# Patient Record
Sex: Female | Born: 1950
Health system: Southern US, Community
[De-identification: ages and names within clinical notes are randomized; demographics above are authoritative.]

## PROBLEM LIST (undated history)

## (undated) ENCOUNTER — Emergency Department (HOSPITAL_COMMUNITY): Admission: EM | Payer: Medicare Other

## (undated) DIAGNOSIS — I10 Essential (primary) hypertension: Secondary | ICD-10-CM

## (undated) DIAGNOSIS — N390 Urinary tract infection, site not specified: Secondary | ICD-10-CM

## (undated) DIAGNOSIS — Z923 Personal history of irradiation: Secondary | ICD-10-CM

## (undated) DIAGNOSIS — C50919 Malignant neoplasm of unspecified site of unspecified female breast: Secondary | ICD-10-CM

## (undated) DIAGNOSIS — C801 Malignant (primary) neoplasm, unspecified: Secondary | ICD-10-CM

## (undated) DIAGNOSIS — E785 Hyperlipidemia, unspecified: Secondary | ICD-10-CM

## (undated) DIAGNOSIS — K219 Gastro-esophageal reflux disease without esophagitis: Secondary | ICD-10-CM

## (undated) HISTORY — PX: TONSILLECTOMY: SUR1361

## (undated) HISTORY — PX: BREAST LUMPECTOMY: SHX2

## (undated) HISTORY — PX: HAND SURGERY: SHX662

## (undated) HISTORY — PX: BREAST BIOPSY: SHX20

## (undated) HISTORY — DX: Personal history of irradiation: Z92.3

## (undated) HISTORY — PX: DILATION AND CURETTAGE OF UTERUS: SHX78

---

## 1999-07-26 ENCOUNTER — Encounter: Payer: Self-pay | Admitting: Gynecology

## 1999-07-26 ENCOUNTER — Encounter: Admission: RE | Admit: 1999-07-26 | Discharge: 1999-07-26 | Payer: Self-pay | Admitting: Gynecology

## 1999-12-22 ENCOUNTER — Other Ambulatory Visit: Admission: RE | Admit: 1999-12-22 | Discharge: 1999-12-22 | Payer: Self-pay | Admitting: Gynecology

## 2000-07-31 ENCOUNTER — Encounter: Admission: RE | Admit: 2000-07-31 | Discharge: 2000-07-31 | Payer: Self-pay | Admitting: Gynecology

## 2000-07-31 ENCOUNTER — Encounter: Payer: Self-pay | Admitting: Gynecology

## 2001-08-01 ENCOUNTER — Encounter: Payer: Self-pay | Admitting: Gynecology

## 2001-08-01 ENCOUNTER — Encounter: Admission: RE | Admit: 2001-08-01 | Discharge: 2001-08-01 | Payer: Self-pay | Admitting: Gynecology

## 2001-11-29 ENCOUNTER — Other Ambulatory Visit: Admission: RE | Admit: 2001-11-29 | Discharge: 2001-11-29 | Payer: Self-pay | Admitting: Gynecology

## 2002-08-02 ENCOUNTER — Encounter: Payer: Self-pay | Admitting: Gynecology

## 2002-08-02 ENCOUNTER — Encounter: Admission: RE | Admit: 2002-08-02 | Discharge: 2002-08-02 | Payer: Self-pay | Admitting: Gynecology

## 2003-01-27 ENCOUNTER — Other Ambulatory Visit: Admission: RE | Admit: 2003-01-27 | Discharge: 2003-01-27 | Payer: Self-pay | Admitting: Gynecology

## 2003-08-07 ENCOUNTER — Encounter: Admission: RE | Admit: 2003-08-07 | Discharge: 2003-08-07 | Payer: Self-pay | Admitting: Gynecology

## 2004-03-08 ENCOUNTER — Other Ambulatory Visit: Admission: RE | Admit: 2004-03-08 | Discharge: 2004-03-08 | Payer: Self-pay | Admitting: Gynecology

## 2004-08-31 ENCOUNTER — Encounter: Admission: RE | Admit: 2004-08-31 | Discharge: 2004-08-31 | Payer: Self-pay | Admitting: Gynecology

## 2005-03-08 ENCOUNTER — Other Ambulatory Visit: Admission: RE | Admit: 2005-03-08 | Discharge: 2005-03-08 | Payer: Self-pay | Admitting: Gynecology

## 2005-09-20 ENCOUNTER — Encounter: Admission: RE | Admit: 2005-09-20 | Discharge: 2005-09-20 | Payer: Self-pay | Admitting: Gynecology

## 2005-09-20 IMAGING — MG MM MAMMO SCREENING
4 series · 4 of 4 positions shown · non-contrast
Comparison: none

SCREENING MAMMOGRAM:
There is a  dense fibroglandular pattern.  No masses or malignant type calcifications are 
identified.  Compared with prior studies.

[R CC]
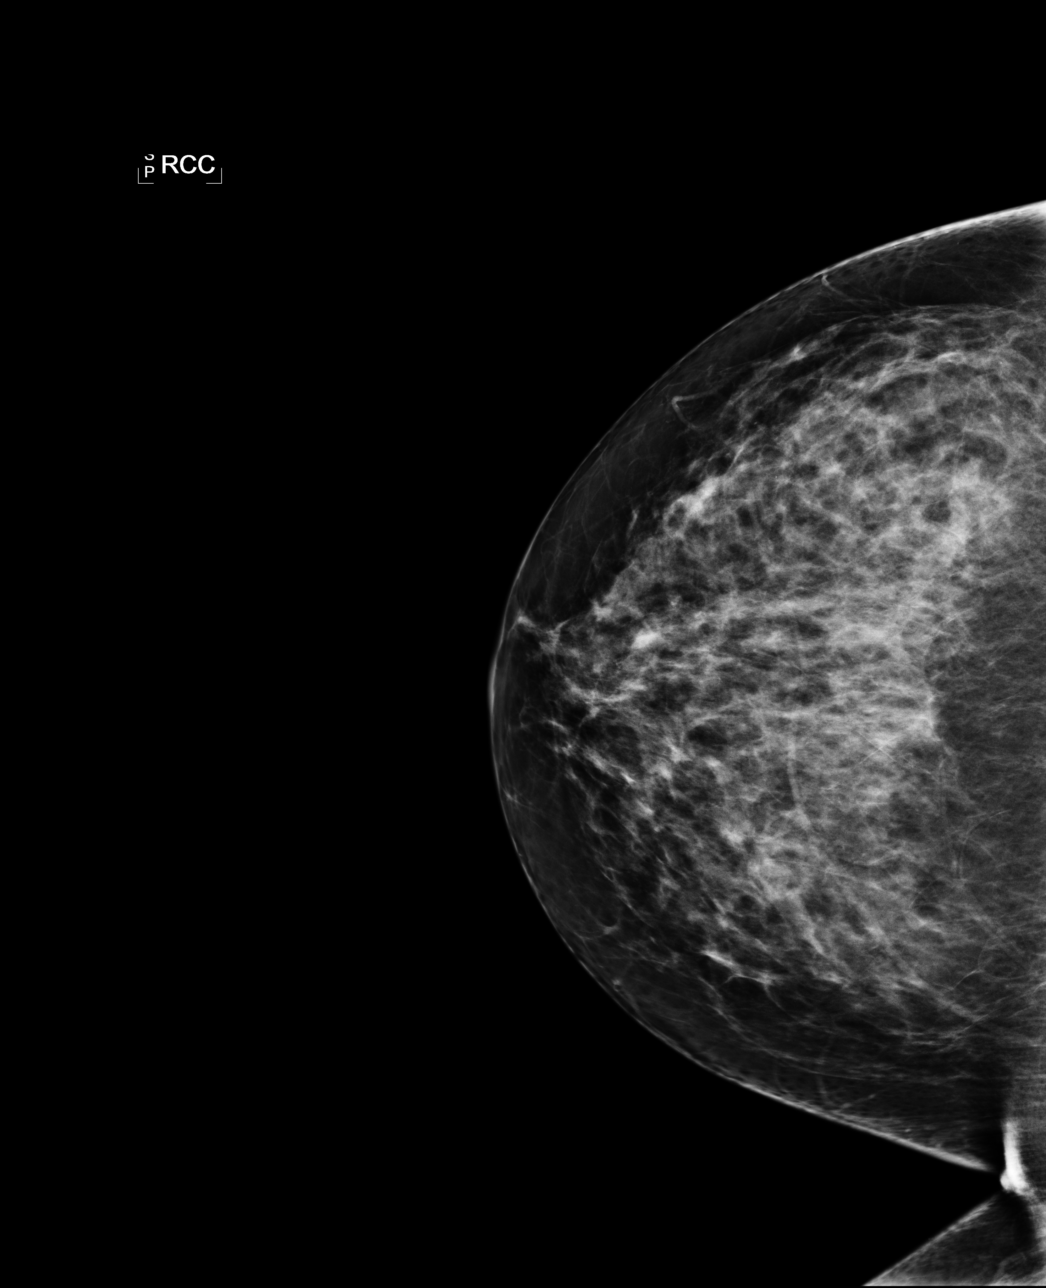

[L CC]
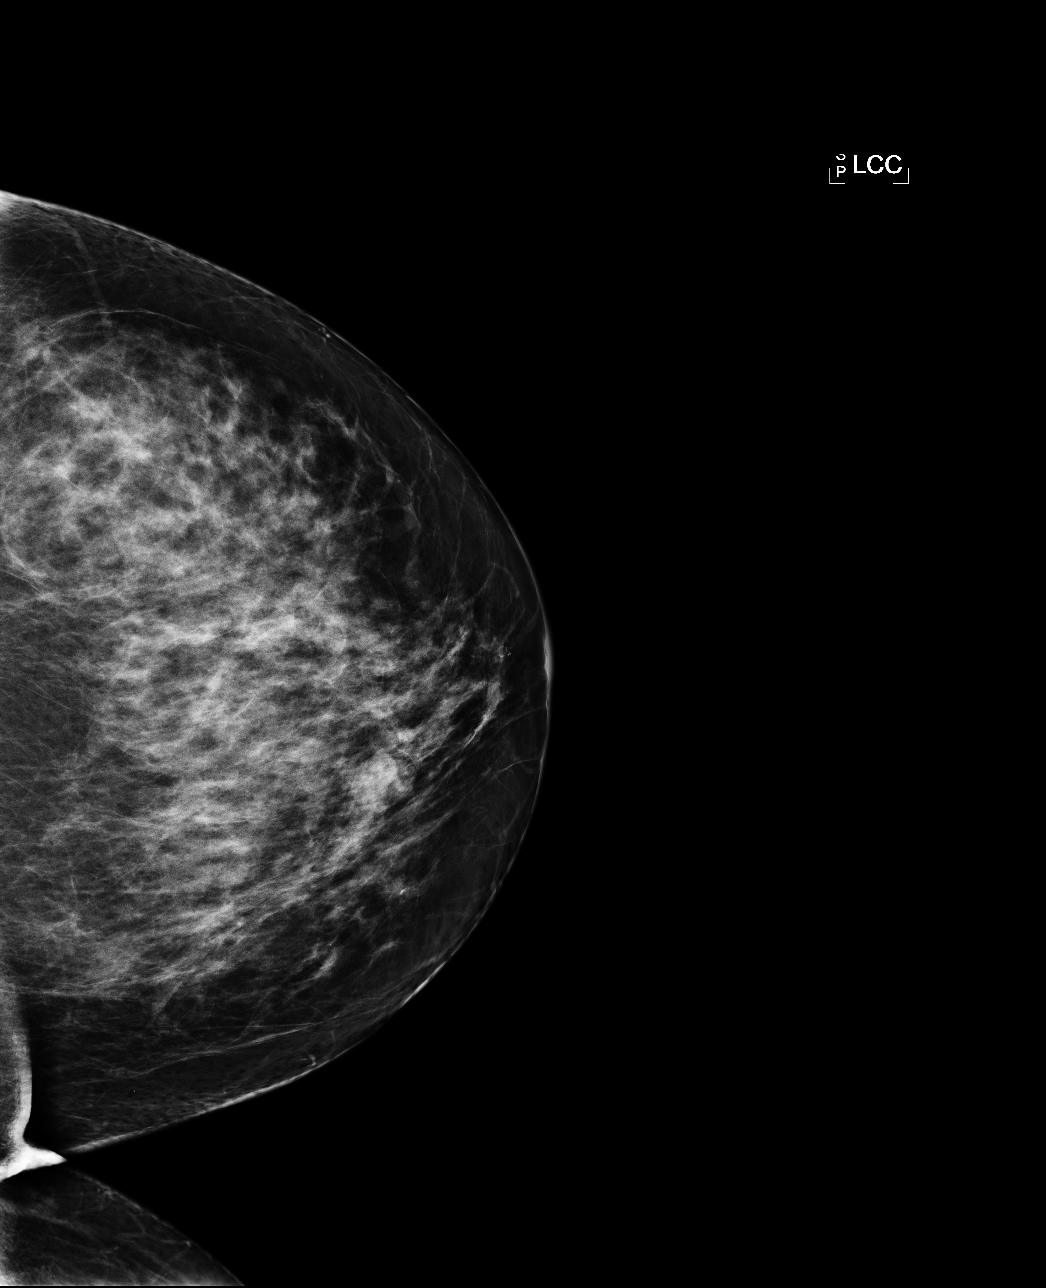

[L MLO]
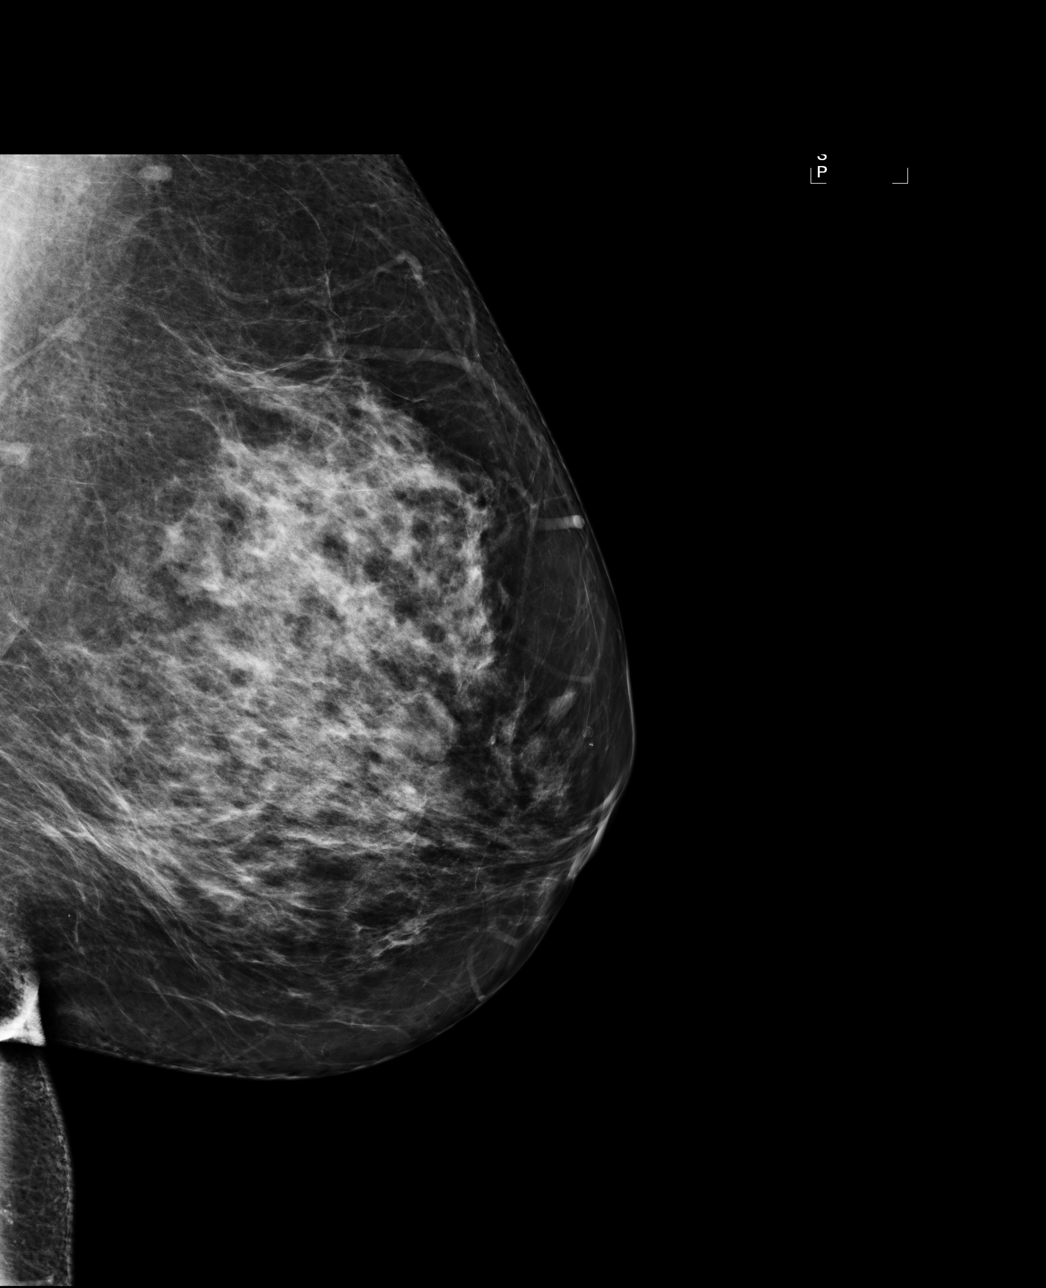

[R MLO]
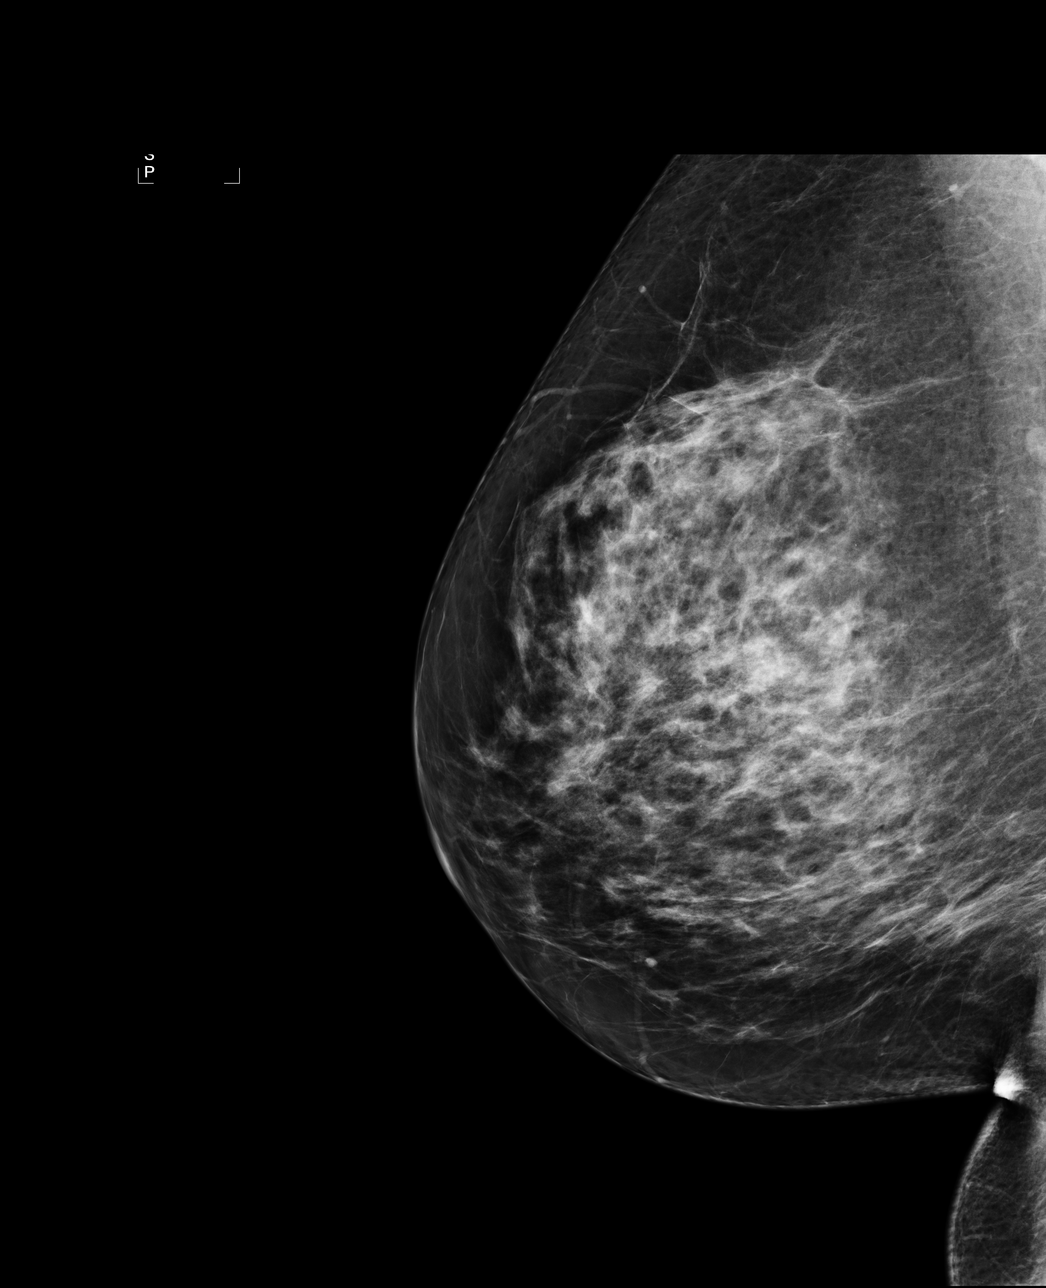

[4 of 4 positions shown; findings below may reference images not displayed]

IMPRESSION: No specific mammographic evidence of malignancy.  Next screening mammogram is recommended in one 
year.

ASSESSMENT: Negative - BI-RADS 1

Screening mammogram in 1 year.

## 2006-03-30 ENCOUNTER — Other Ambulatory Visit: Admission: RE | Admit: 2006-03-30 | Discharge: 2006-03-30 | Payer: Self-pay | Admitting: Gynecology

## 2006-10-19 ENCOUNTER — Encounter: Admission: RE | Admit: 2006-10-19 | Discharge: 2006-10-19 | Payer: Self-pay | Admitting: Gynecology

## 2007-06-26 ENCOUNTER — Other Ambulatory Visit: Admission: RE | Admit: 2007-06-26 | Discharge: 2007-06-26 | Payer: Self-pay | Admitting: Gynecology

## 2007-10-26 ENCOUNTER — Encounter: Admission: RE | Admit: 2007-10-26 | Discharge: 2007-10-26 | Payer: Self-pay | Admitting: Gynecology

## 2008-09-08 ENCOUNTER — Other Ambulatory Visit: Admission: RE | Admit: 2008-09-08 | Discharge: 2008-09-08 | Payer: Self-pay | Admitting: Gynecology

## 2008-10-31 ENCOUNTER — Encounter: Admission: RE | Admit: 2008-10-31 | Discharge: 2008-10-31 | Payer: Self-pay | Admitting: Gynecology

## 2008-11-28 ENCOUNTER — Encounter (INDEPENDENT_AMBULATORY_CARE_PROVIDER_SITE_OTHER): Payer: Self-pay | Admitting: Obstetrics and Gynecology

## 2008-11-28 ENCOUNTER — Ambulatory Visit (HOSPITAL_COMMUNITY): Admission: RE | Admit: 2008-11-28 | Discharge: 2008-11-28 | Payer: Self-pay | Admitting: Obstetrics and Gynecology

## 2008-11-28 HISTORY — PX: HYSTEROSCOPY WITH D & C: SHX1775

## 2008-12-18 ENCOUNTER — Emergency Department (HOSPITAL_COMMUNITY): Admission: EM | Admit: 2008-12-18 | Discharge: 2008-12-19 | Payer: Self-pay | Admitting: Emergency Medicine

## 2009-10-13 ENCOUNTER — Encounter: Admission: RE | Admit: 2009-10-13 | Discharge: 2009-10-13 | Payer: Self-pay | Admitting: Obstetrics and Gynecology

## 2009-11-02 ENCOUNTER — Encounter: Admission: RE | Admit: 2009-11-02 | Discharge: 2009-11-02 | Payer: Self-pay | Admitting: Obstetrics and Gynecology

## 2010-10-26 ENCOUNTER — Other Ambulatory Visit: Payer: Self-pay | Admitting: Obstetrics and Gynecology

## 2010-10-26 DIAGNOSIS — Z1239 Encounter for other screening for malignant neoplasm of breast: Secondary | ICD-10-CM

## 2010-11-08 ENCOUNTER — Ambulatory Visit
Admission: RE | Admit: 2010-11-08 | Discharge: 2010-11-08 | Disposition: A | Discharge status: Home / Self Care | Payer: 59 | Source: Ambulatory Visit | Attending: Obstetrics and Gynecology | Admitting: Obstetrics and Gynecology

## 2010-11-08 DIAGNOSIS — Z1239 Encounter for other screening for malignant neoplasm of breast: Secondary | ICD-10-CM

## 2010-12-01 LAB — URINE CULTURE

## 2010-12-01 LAB — BASIC METABOLIC PANEL
BUN: 9 mg/dL (ref 6–23)
CO2: 29 mEq/L (ref 19–32)
Calcium: 9.5 mg/dL (ref 8.4–10.5)
Creatinine, Ser: 0.67 mg/dL (ref 0.4–1.2)
Sodium: 140 mEq/L (ref 135–145)

## 2010-12-01 LAB — GLUCOSE, CAPILLARY: Glucose-Capillary: 154 mg/dL — ABNORMAL HIGH (ref 70–99)

## 2010-12-01 LAB — URINALYSIS, ROUTINE W REFLEX MICROSCOPIC
Bilirubin Urine: NEGATIVE
Glucose, UA: NEGATIVE mg/dL
Ketones, ur: NEGATIVE mg/dL
Nitrite: NEGATIVE
Protein, ur: NEGATIVE mg/dL
Specific Gravity, Urine: 1.004 — ABNORMAL LOW (ref 1.005–1.030)
Urobilinogen, UA: 0.2 mg/dL (ref 0.0–1.0)
pH: 6.5 (ref 5.0–8.0)

## 2010-12-01 LAB — CBC
HCT: 43.4 % (ref 36.0–46.0)
Hemoglobin: 14.4 g/dL (ref 12.0–15.0)
RBC: 4.85 MIL/uL (ref 3.87–5.11)

## 2010-12-01 LAB — URINE MICROSCOPIC-ADD ON

## 2011-01-04 NOTE — Op Note (Signed)
NAMEMarland Kitchen  Meghan Padilla, Meghan Padilla NO.:  [id number]   MEDICAL RECORD NO.:  [id number]          PATIENT TYPE:  AMB   LOCATION:  SDC                           FACILITY:  [facility]   PHYSICIAN:  Juluis Mire, M.D.   DATE OF BIRTH:  03-May-1951   DATE OF PROCEDURE:  11/28/2008  DATE OF DISCHARGE:                               OPERATIVE REPORT   PREOPERATIVE DIAGNOSIS:  Endometrial polyp.   POSTOPERATIVE DIAGNOSIS:  Endometrial polyp.   OPERATIVE PROCEDURES:  1. Paracervical block.  2. Cervical dilatation.  3. Hysteroscopy with removal of endometrial polyps.  4. Endometrial biopsies along with curettings.   SURGEON:  Juluis Mire, MD   ANESTHESIA:  Paracervical block along with MAC anesthesia.   ESTIMATED BLOOD LOSS:  Minimal.   PACKS AND DRAINS:  None.   INTRAOPERATIVE BLOOD PLACED:  None.   COMPLICATIONS:  None.   INDICATIONS:  Dictated in the history and physical.   PROCEDURE:  The patient was taken to OR, placed in supine position.  After satisfactory level of MAC anesthesia was obtained, the patient was  placed in dorsal lithotomy position using the Allen stirrups.  The  perineum and vagina were cleansed out with Betadine and draped as  sterile field.  A speculum was placed in the vaginal vault.  The cervix  was grasped with a single-tooth tenaculum.  A paracervical block was  instituted using 1% Xylocaine.  Uterus sounded to approximately 9 cm.  Cervix serially dilated to a size 33 Pratt dilator.  Operative  hysteroscope was introduced.  The intrauterine cavity was distended  using sorbitol.  Multiple polyps were noted.  We next used the Randall  stone forceps and we were able to remove the larger polyp intact.  We  then reinserted the hysteroscope.  Polyps remaining were resected and  sent to Pathology.  We did multiple endometrial biopsies along with  curettings.  Total deficit was minimal.  There was no signs of perforation.  At this  point in time, the  single-tooth tenaculum and speculum were then  removed.  The patient taken out of dorsal lithotomy position once alert,  transferred to recovery room in good condition.  Sponges, instrument,  and needle count was reported correct by the circulating nurse.      Juluis Mire, M.D.  Electronically Signed     JSM/MEDQ  D:  11/28/2008  T:  11/29/2008  Job:  [id number]

## 2011-01-04 NOTE — H&P (Signed)
NAME:  Meghan Padilla, Meghan Padilla NO.:  [id number]   MEDICAL RECORD NO.:  [id number]          PATIENT TYPE:  AMB   LOCATION:  SDC                           FACILITY:  [facility]   PHYSICIAN:  Juluis Mire, M.D.   DATE OF BIRTH:  [birth year]   DATE OF ADMISSION:  11/28/2008  DATE OF DISCHARGE:                              HISTORY & PHYSICAL   The patient is a 7-year gravida 2, para 2, postmenopausal patient,  initially evaluated by Dr. Teodora Medici for postmenopausal bleeding,  underwent saline infusion ultrasound and we find several large  endometrial polyps, now presents for hysteroscopic resection.   ALLERGIES:  No known drug allergies are listed.   MEDICATIONS:  1. Accuretic 40 mg daily.  2. Lipitor 40 mg daily.  3. Aciphex.  4. Metformin.   PAST MEDICAL HISTORY:  Significant for history of glucose intolerance as  well as hypercholesterolemia followed by Dr. Valentina Lucks.  She has no  surgical history listed.   OBSTETRICAL HISTORY:  Two vaginal deliveries.   FAMILY HISTORY:  Noncontributory.   SOCIAL HISTORY:  No tobacco or alcohol use.   REVIEW OF SYSTEMS:  Noncontributory.   PHYSICAL EXAMINATION:  GENERAL:  The patient is afebrile, stable vital  signs.  HEENT:  The patient is normocephalic.  Pupils are equal, round, and  react to light and accommodation.  Extraocular movements were intact.  Sclerae and conjunctivae are clear.  Oropharynx is clear.  BREASTS:  Not examined.  LUNGS:  Clear.  CARDIAC:  Regular rate.  No murmurs or gallops.  ABDOMEN:  Benign.  No mass, organomegaly, or tenderness.  PELVIC:  Normal external genitalia.  Vaginal mucosa is clear.  Cervix  unremarkable.  Uterus normal size, shape, and contour.  Adnexa, free of  masses or tenderness.  EXTREMITIES:  Trace edema.  NEUROLOGIC:  Grossly normal limits.   IMPRESSION:  Postmenopausal bleeding with endometrial polyps.   PLAN:  The patient undergo hysteroscopic evaluation and resection of  polyps.   The risks of surgery have been discussed included the risk of  infection.  The risk of hemorrhage could require transfusion with the  risk of AIDS or  hepatitis.  Excessive bleeding could require hysterectomy.  There is a  risk of injury to adjacent organs through perforation that could require  further exploratory surgery.  Risk of deep venous thrombosis and  pulmonary embolus.  The patient does understand indications and  potential risks.      Juluis Mire, M.D.  Electronically Signed     JSM/MEDQ  D:  11/28/2008  T:  11/28/2008  Job:  [id number]

## 2011-04-09 ENCOUNTER — Emergency Department (HOSPITAL_COMMUNITY)
Admission: EM | Admit: 2011-04-09 | Discharge: 2011-04-09 | Disposition: A | Discharge status: Home / Self Care | Payer: 59 | Attending: Emergency Medicine | Admitting: Emergency Medicine

## 2011-04-09 DIAGNOSIS — R300 Dysuria: Secondary | ICD-10-CM | POA: Insufficient documentation

## 2011-04-09 DIAGNOSIS — E119 Type 2 diabetes mellitus without complications: Secondary | ICD-10-CM | POA: Insufficient documentation

## 2011-04-09 DIAGNOSIS — N390 Urinary tract infection, site not specified: Secondary | ICD-10-CM | POA: Insufficient documentation

## 2011-04-09 DIAGNOSIS — I10 Essential (primary) hypertension: Secondary | ICD-10-CM | POA: Insufficient documentation

## 2011-04-09 LAB — URINALYSIS, ROUTINE W REFLEX MICROSCOPIC
Bilirubin Urine: NEGATIVE
Glucose, UA: NEGATIVE mg/dL
Hgb urine dipstick: NEGATIVE
Protein, ur: NEGATIVE mg/dL
Specific Gravity, Urine: 1.013 (ref 1.005–1.030)
Urobilinogen, UA: 0.2 mg/dL (ref 0.0–1.0)
pH: 7 (ref 5.0–8.0)

## 2011-04-09 LAB — URINE MICROSCOPIC-ADD ON

## 2011-04-09 LAB — GLUCOSE, CAPILLARY

## 2011-04-12 LAB — URINE CULTURE: Colony Count: >=100000

## 2012-01-01 ENCOUNTER — Emergency Department (HOSPITAL_COMMUNITY)
Admission: EM | Admit: 2012-01-01 | Discharge: 2012-01-01 | Disposition: A | Discharge status: Home / Self Care | Payer: 59 | Source: Home / Self Care | Attending: Family Medicine | Admitting: Family Medicine

## 2012-01-01 ENCOUNTER — Encounter (HOSPITAL_COMMUNITY): Payer: Self-pay | Admitting: *Deleted

## 2012-01-01 DIAGNOSIS — N390 Urinary tract infection, site not specified: Secondary | ICD-10-CM

## 2012-01-01 HISTORY — DX: Essential (primary) hypertension: I10

## 2012-01-01 HISTORY — DX: Hyperlipidemia, unspecified: E78.5

## 2012-01-01 HISTORY — DX: Urinary tract infection, site not specified: N39.0

## 2012-01-01 LAB — POCT URINALYSIS DIP (DEVICE)
Bilirubin Urine: NEGATIVE
Glucose, UA: NEGATIVE mg/dL
Ketones, ur: NEGATIVE mg/dL
Protein, ur: NEGATIVE mg/dL

## 2012-01-01 MED ORDER — CEPHALEXIN 500 MG PO CAPS: 500.0000 mg | ORAL_CAPSULE | Freq: Four times a day (QID) | ORAL | Status: AC

## 2012-01-01 MED ORDER — FLUCONAZOLE 150 MG PO TABS: 150.0000 mg | ORAL_TABLET | Freq: Once | ORAL | Status: AC

## 2012-01-01 NOTE — ED Notes (Signed)
C/O familiar bladder pressure and urinary urgency she has had in past with UTIs.  Called urologist yesterday and was called in Rx for Uribel, but states not helping.  Denies flank pain or back pain; denies fevers.

## 2012-01-01 NOTE — ED Provider Notes (Signed)
History     CSN: [id number]  Arrival date & time 01/01/12  1526   None     Chief Complaint  Patient presents with  . Urinary Tract Infection    (Consider location/radiation/quality/duration/timing/severity/associated sxs/prior treatment) HPI Comments: Pt had uterine surgery several years ago, gets frequent UTIs since then.  Sees urologist for same. Developed usual sx yesterday morning.  Last UTI in 08/2011.    Patient is a 61 y.o. female presenting with dysuria. The history is provided by the patient.  Dysuria  This is a new problem. The current episode started yesterday. The problem occurs every urination. The problem has been gradually worsening. The quality of the pain is described as burning and aching. The pain is at a severity of 4/10. The pain is mild. There has been no fever. Associated symptoms include frequency and urgency. Pertinent negatives include no nausea, no vomiting, no discharge, no hematuria and no flank pain. Her past medical history is significant for recurrent UTIs.    Past Medical History  Diagnosis Date  . Hyperlipidemia   . Hypertension   . Diabetes mellitus   . UTI (urinary tract infection)     Past Surgical History  Procedure Date  . Tonsillectomy   . Hand surgery     History reviewed. No pertinent family history.  History  Substance Use Topics  . Smoking status: Not on file  . Smokeless tobacco: Not on file  . Alcohol Use: No    OB History    Grav Para Term Preterm Abortions TAB SAB Ect Mult Living                  Review of Systems  Constitutional: Negative for fever.  Gastrointestinal: Positive for abdominal pain. Negative for nausea and vomiting.  Genitourinary: Positive for dysuria, urgency and frequency. Negative for hematuria and flank pain.    Allergies  Sulfa antibiotics  Home Medications   Current Outpatient Rx  Name Route Sig Dispense Refill  . AMITRIPTYLINE HCL 25 MG PO TABS Oral Take 25 mg by mouth at bedtime.      . ATORVASTATIN CALCIUM 40 MG PO TABS Oral Take 40 mg by mouth daily.    Marland Kitchen METFORMIN HCL 500 MG PO TABS Oral Take 1,500 mg by mouth daily.    Marland Kitchen URIBEL PO Oral Take by mouth.    . OMEPRAZOLE 20 MG PO CPDR Oral Take 20 mg by mouth daily.    . QUINAPRIL-HYDROCHLOROTHIAZIDE PO Oral Take by mouth.    . CEPHALEXIN 500 MG PO CAPS Oral Take 1 capsule (500 mg total) by mouth 4 (four) times daily. 20 capsule 0  . FLUCONAZOLE 150 MG PO TABS Oral Take 1 tablet (150 mg total) by mouth once. May repeat dose in 1 week if symptoms persist. 2 tablet 0    BP 129/79  Pulse 104  Temp(Src) 98 F (36.7 C) (Oral)  Resp 16  SpO2 98%  Physical Exam  Constitutional: She appears well-developed and well-nourished. No distress.  Cardiovascular: Normal rate and normal heart sounds.   Pulmonary/Chest: Effort normal and breath sounds normal.  Abdominal: Soft. Bowel sounds are normal. There is no tenderness. There is no rebound and no guarding.    ED Course  Procedures (including critical care time)  Labs Reviewed  POCT URINALYSIS DIP (DEVICE) - Abnormal; Notable for the following:    Hgb urine dipstick MODERATE (*)    Leukocytes, UA LARGE (*) Biochemical Testing Only. Please order routine urinalysis from main lab  if confirmatory testing is needed.   All other components within normal limits   No results found.   1. UTI (lower urinary tract infection)       MDM          Cathlyn Parsons, NP 01/01/12 1615

## 2012-01-01 NOTE — Discharge Instructions (Signed)
Increase your water intake.  Try to drink 2 liters of water (eight 8-ounce cups) per day.  Finish all of the antibiotics.  If you feel you are not getting better, follow up with your doctor or urologist.   Urinary Tract Infection Infections of the urinary tract can start in several places. A bladder infection (cystitis), a kidney infection (pyelonephritis), and a prostate infection (prostatitis) are different types of urinary tract infections (UTIs). They usually get better if treated with medicines (antibiotics) that kill germs. Take all the medicine until it is gone. You or your child may feel better in a few days, but TAKE ALL MEDICINE or the infection may not respond and may become more difficult to treat. HOME CARE INSTRUCTIONS   Drink enough water and fluids to keep the urine clear or pale yellow. Cranberry juice is especially recommended, in addition to large amounts of water.   Avoid caffeine, tea, and carbonated beverages. They tend to irritate the bladder.   Alcohol may irritate the prostate.   Only take over-the-counter or prescription medicines for pain, discomfort, or fever as directed by your caregiver.  To prevent further infections:  Empty the bladder often. Avoid holding urine for long periods of time.   After a bowel movement, women should cleanse from front to back. Use each tissue only once.   Empty the bladder before and after sexual intercourse.  FINDING OUT THE RESULTS OF YOUR TEST Not all test results are available during your visit. If your or your child's test results are not back during the visit, make an appointment with your caregiver to find out the results. Do not assume everything is normal if you have not heard from your caregiver or the medical facility. It is important for you to follow up on all test results. SEEK MEDICAL CARE IF:   There is back pain.   Your baby is older than 3 months with a rectal temperature of 100.5 F (38.1 C) or higher for more  than 1 day.   Your or your child's problems (symptoms) are no better in 3 days. Return sooner if you or your child is getting worse.  SEEK IMMEDIATE MEDICAL CARE IF:   There is severe back pain or lower abdominal pain.   You or your child develops chills.   You have a fever.   Your baby is older than 3 months with a rectal temperature of 102 F (38.9 C) or higher.   Your baby is 59 months old or younger with a rectal temperature of 100.4 F (38 C) or higher.   There is nausea or vomiting.   There is continued burning or discomfort with urination.  MAKE SURE YOU:   Understand these instructions.   Will watch your condition.   Will get help right away if you are not doing well or get worse.  Document Released: 05/18/2005 Document Revised: 07/28/2011 Document Reviewed: 12/21/2006 [facility] Patient Information 2012 [place], [place].

## 2012-01-28 ENCOUNTER — Encounter (HOSPITAL_COMMUNITY): Payer: Self-pay | Admitting: *Deleted

## 2012-01-28 ENCOUNTER — Emergency Department (HOSPITAL_COMMUNITY)
Admission: EM | Admit: 2012-01-28 | Discharge: 2012-01-28 | Disposition: A | Discharge status: Home / Self Care | Payer: 59 | Attending: Emergency Medicine | Admitting: Emergency Medicine

## 2012-01-28 DIAGNOSIS — Z882 Allergy status to sulfonamides status: Secondary | ICD-10-CM | POA: Insufficient documentation

## 2012-01-28 DIAGNOSIS — E119 Type 2 diabetes mellitus without complications: Secondary | ICD-10-CM | POA: Insufficient documentation

## 2012-01-28 DIAGNOSIS — R3915 Urgency of urination: Secondary | ICD-10-CM

## 2012-01-28 DIAGNOSIS — I10 Essential (primary) hypertension: Secondary | ICD-10-CM | POA: Insufficient documentation

## 2012-01-28 DIAGNOSIS — R300 Dysuria: Secondary | ICD-10-CM | POA: Insufficient documentation

## 2012-01-28 DIAGNOSIS — E785 Hyperlipidemia, unspecified: Secondary | ICD-10-CM | POA: Insufficient documentation

## 2012-01-28 DIAGNOSIS — Z87440 Personal history of urinary (tract) infections: Secondary | ICD-10-CM | POA: Insufficient documentation

## 2012-01-28 LAB — URINE MICROSCOPIC-ADD ON

## 2012-01-28 LAB — URINALYSIS, ROUTINE W REFLEX MICROSCOPIC
Bilirubin Urine: NEGATIVE
Glucose, UA: NEGATIVE mg/dL
Hgb urine dipstick: NEGATIVE
Specific Gravity, Urine: 1.006 (ref 1.005–1.030)
Urobilinogen, UA: 0.2 mg/dL (ref 0.0–1.0)
pH: 7 (ref 5.0–8.0)

## 2012-01-28 LAB — POCT PREGNANCY, URINE: Preg Test, Ur: NEGATIVE

## 2012-01-28 NOTE — ED Notes (Signed)
Pt states she has had multiple uti and feels like she is starting to get another infection.

## 2012-01-28 NOTE — ED Notes (Signed)
Pt has recurrent UTI one in Jan, and one in May. Pt states that she started having the same feeling today that something wasn't right when she urinates. Pt able to provide sample in triage. Pt states frequency, burning, pressure.

## 2012-01-28 NOTE — ED Provider Notes (Signed)
History     CSN: [id number]  Arrival date & time 01/28/12  1931   First MD Initiated Contact with Patient 01/28/12 2150      Chief Complaint  Patient presents with  . Urinary Tract Infection    HPI  History provided by the patient. Patient is a 61 year old female with history of hypertension, hyperlipidemia and diabetes who presents with concerns for urinary tract infection. Patient reports having some pressure over her bladder with urinary frequency and urgency and again today. Patient states symptoms feel similar to the start of her previous urinary tract infections. Patient states he has had many recurrent UTIs since the beginning of the year. She was last treated for a urinary tract infection many weeks ago with Cipro. She has not had any complaints since that time until today. Symptoms are described as mild. She denies any other aggravating or alleviating factors. Symptoms have not been associated with any fever, chills, sweats, nausea, vomiting, flank pain. Patient denies any vaginal bleeding or vaginal discharge.    Past Medical History  Diagnosis Date  . Hyperlipidemia   . Hypertension   . Diabetes mellitus   . UTI (urinary tract infection)     Past Surgical History  Procedure Date  . Tonsillectomy   . Hand surgery     History reviewed. No pertinent family history.  History  Substance Use Topics  . Smoking status: Not on file  . Smokeless tobacco: Not on file  . Alcohol Use: No    OB History    Grav Para Term Preterm Abortions TAB SAB Ect Mult Living                  Review of Systems  Constitutional: Negative for fever and chills.  Gastrointestinal: Negative for nausea, vomiting, abdominal pain, diarrhea and constipation.  Genitourinary: Positive for dysuria, urgency and frequency. Negative for hematuria, flank pain, vaginal bleeding and vaginal discharge.    Allergies  Sulfa antibiotics  Home Medications   Current Outpatient Rx  Name Route Sig  Dispense Refill  . AMITRIPTYLINE HCL 25 MG PO TABS Oral Take 12.5 mg by mouth at bedtime.     . ATORVASTATIN CALCIUM 40 MG PO TABS Oral Take 40 mg by mouth daily.    Marland Kitchen METFORMIN HCL 500 MG PO TABS Oral Take 1,500 mg by mouth at bedtime.     . OMEPRAZOLE 20 MG PO CPDR Oral Take 20 mg by mouth daily.    . QUINAPRIL-HYDROCHLOROTHIAZIDE 20-25 MG PO TABS Oral Take 1 tablet by mouth daily.    Marland Kitchen URIBEL PO Oral Take 1 tablet by mouth every 4 (four) hours as needed. As needed for bladder spasm.      BP 156/75  Pulse 102  Temp(Src) 97.5 F (36.4 C) (Oral)  Resp 16  SpO2 98%  Physical Exam  Nursing note and vitals reviewed. Constitutional: She is oriented to person, place, and time. She appears well-developed and well-nourished. No distress.  HENT:  Head: Normocephalic.  Cardiovascular: Normal rate and regular rhythm.   Pulmonary/Chest: Effort normal and breath sounds normal. No respiratory distress. She has no wheezes.  Abdominal: Soft. There is no tenderness. There is no CVA tenderness.  Neurological: She is alert and oriented to person, place, and time.  Skin: Skin is warm and dry.  Psychiatric: She has a normal mood and affect. Her behavior is normal.    ED Course  Procedures   Results for orders placed during the hospital encounter of 01/28/12  URINALYSIS, ROUTINE W REFLEX MICROSCOPIC      Component Value Range   Color, Urine YELLOW  YELLOW    APPearance CLEAR  CLEAR    Specific Gravity, Urine 1.006  1.005 - 1.030    pH 7.0  5.0 - 8.0    Glucose, UA NEGATIVE  NEGATIVE (mg/dL)   Hgb urine dipstick NEGATIVE  NEGATIVE    Bilirubin Urine NEGATIVE  NEGATIVE    Ketones, ur NEGATIVE  NEGATIVE (mg/dL)   Protein, ur NEGATIVE  NEGATIVE (mg/dL)   Urobilinogen, UA 0.2  0.0 - 1.0 (mg/dL)   Nitrite NEGATIVE  NEGATIVE    Leukocytes, UA TRACE (*) NEGATIVE   POCT PREGNANCY, URINE      Component Value Range   Preg Test, Ur NEGATIVE  NEGATIVE   URINE MICROSCOPIC-ADD ON      Component Value  Range   Squamous Epithelial / LPF RARE  RARE    WBC, UA 0-2  <3 (WBC/hpf)       1. Urgency of urination   2. Dysuria       MDM  Patient seen and evaluated. Patient no acute distress.  Discussed the findings with UA with patient. At this time no significant findings to stress UTI. I discussed treatment options with patient. Patient feels that she will does return home and wait and see how things progress. Patient does have medications from her urologist help with frequency and urgency symptoms she will try using.       Angus Seller, [place] 01/29/12 [phone]

## 2012-01-28 NOTE — ED Notes (Signed)
Discharged via teach back

## 2012-01-28 NOTE — Discharge Instructions (Signed)
You were seen and evaluated for your symptoms of urinary urgency and bladder fullness. At this time your urine sample does not show clear signs for an infection. Please continue followup with your primary care providers or urology specialist for evaluation and treatment. Drink plenty of fluids to stay hydrated and help with symptoms.    Dysuria Dysuria is the medical term for pain with urination. There are many causes for dysuria, but urinary tract infection is the most common. If a urinalysis was performed it can show that there is a urinary tract infection. A urine culture confirms that you or your child is sick. You will need to follow up with a healthcare provider because:  If a urine culture was done you will need to know the culture results and treatment recommendations.   If the urine culture was positive, you or your child will need to be put on antibiotics or know if the antibiotics prescribed are the right antibiotics for your urinary tract infection.   If the urine culture is negative (no urinary tract infection), then other causes may need to be explored or antibiotics need to be stopped.  Today laboratory work may have been done and there does not seem to be an infection. If cultures were done they will take at least 24 to 48 hours to be completed. Today x-rays may have been taken and they read as normal. No cause can be found for the problems. The x-rays may be re-read by a radiologist and you will be contacted if additional findings are made. You or your child may have been put on medications to help with this problem until you can see your primary caregiver. If the problems get better, see your primary caregiver if the problems return. If you were given antibiotics (medications which kill germs), take all of the mediations as directed for the full course of treatment.  If laboratory work was done, you need to find the results. Leave a telephone number where you can be reached. If this  is not possible, make sure you find out how you are to get test results. HOME CARE INSTRUCTIONS   Drink lots of fluids. For adults, drink eight, 8 ounce glasses of clear juice or water a day. For children, replace fluids as suggested by your caregiver.   Empty the bladder often. Avoid holding urine for long periods of time.   After a bowel movement, women should cleanse front to back, using each tissue only once.   Empty your bladder before and after sexual intercourse.   Take all the medicine given to you until it is gone. You may feel better in a few days, but TAKE ALL MEDICINE.   Avoid caffeine, tea, alcohol and carbonated beverages, because they tend to irritate the bladder.   In men, alcohol may irritate the prostate.   Only take over-the-counter or prescription medicines for pain, discomfort, or fever as directed by your caregiver.   If your caregiver has given you a follow-up appointment, it is very important to keep that appointment. Not keeping the appointment could result in a chronic or permanent injury, pain, and disability. If there is any problem keeping the appointment, you must call back to this facility for assistance.  SEEK IMMEDIATE MEDICAL CARE IF:   Back pain develops.   A fever develops.   There is nausea (feeling sick to your stomach) or vomiting (throwing up).   Problems are no better with medications or are getting worse.  MAKE SURE YOU:  Understand these instructions.   Will watch your condition.   Will get help right away if you are not doing well or get worse.  Document Released: 05/06/2004 Document Revised: 07/28/2011 Document Reviewed: 03/13/2008 [facility] Patient Information 2012 [place], [place].   RESOURCE GUIDE  Chronic Pain Problems: Contact [facility]  [phone] Patients need to be referred by their primary care doctor.  Insufficient Money for Medicine: Contact United Way:  call "211" or [facility]  941-644-1783.  No Primary Care Doctor: - Call [facility]  [phone] - can help you locate a primary care doctor that  accepts your insurance, provides certain services, etc. - Physician Referral Service- 867-018-2022  Agencies that provide inexpensive medical care: - [facility]  [phone] - [facility]  [phone] - [facility]  [phone] - Women's Clinic  [phone] - Planned Parenthood  [phone] [facility]  [phone]  Medicaid-accepting [facility] Providers: - [facility]- [address]  [phone], Mon-Fri 9am-7pm, Sat 9am-1pm - [facility]- [address] [place], [address] [place]  [phone] - [facility]- [address] [place]  [phone] [facility]- [address]  [phone] - Renaye Rakers- [address] [place], [address], [phone]  Only accepts [place] patients after they have their name  applied to their card  Self Pay (no insurance) in [place]: - Sickle Cell Patients: Dr Willey Blade, [facility]  [address] [place], [phone] - [facility]- [address] [place] [address] [place]  [phone]       [facility] [place]- [address]       -     [facility]- see information above (Speak to [employer] if you do not have insurance)       -  [facility]- [address] [place], [phone]       -  [facility]- [address],  [phone]       -  [facility]- [address] [phone]       -  Dr Julio Sicks-  [address], [place], [phone]       -  [facility]- [address] [phone]       -  [facility]- [address] [phone], also [address] [phone]       -    [facility]- [address] [place], [phone], 1st & 3rd Saturday   every month, 10am-1pm  1)  Find a Doctor and Pay Out of Pocket Although you won't have to find out who is covered by your insurance plan, it is a good idea to ask around and get recommendations. You will then need to call the office and see if the doctor you have chosen will accept you as a new patient and what types of options they offer for patients who are self-pay. Some doctors offer discounts or will set up payment plans for their patients who do not have insurance, but you will need to ask so you aren't surprised when you get to your appointment.  2) Contact Your Local Health Department Not all health departments have doctors that can see patients for sick visits, but many do, so it is worth  a call to see if yours does. If you don't know where your local health department is, you can check in your phone book. The CDC also has a tool to help you locate your state's health department, and many state websites also have listings of all of their local health departments.  3) Find a Walk-in Clinic If your illness is not likely to be very severe or complicated, you may want to try a walk in clinic. These are popping up all over the country in pharmacies, drugstores, and shopping centers. They're usually staffed by nurse practitioners or physician assistants that have been trained to treat common illnesses and complaints. They're usually fairly quick and inexpensive. However, if you have serious medical issues or chronic medical problems, these are probably not your best option  STD Testing - [facility] of [facility] [place], STD Clinic, [address], [place], phone [phone] or [phone].  Monday - Friday, call for an appointment. [facility] Department of [employer], STD Clinic, [address], [place], phone [phone] or [phone].  Monday - Friday, call for an appointment.  Abuse/Neglect: [facility] Child Abuse Hotline (705)575-6699 [facility] Child  Abuse Hotline 580-169-3951 (After Hours)  Emergency Shelter:  [facility] 716-866-2145  Maternity Homes: - [facility] (424) 330-9264 - [facility] (954)591-6012  MRSA Hotline #:   289-558-3922  [facility] Resources  [facility] [place]  United Way [facility] Dept. [address]                 [address]         [address] [place] [address]  Blondell Reveal Phone:  [phone]                                  Phone:  [phone]                   Phone:  (281)[phone]  [facility] Mental [facility], [phone] - [phone]       -     [facility] in [place], [address],                                  ([phone]) [phone], [facility] Child Abuse Hotline 443-494-2506 or (517) 008-9819 (After Hours)   Behavioral Health Services  Substance Abuse Resources: - [facility]  5402914263 - [facility] (617)123-7433 - [facility] 438-588-6835 Floydene Flock 228-805-3263 - Residential & Outpatient Substance Abuse Program  (580)856-8414  Psychological Services: [facility]  772-155-2429 Services  [phone] - [facility], [phone] [place]. [address] [place], ACCESS LINE: (682) 304-7536 or (828)221-4524, [employer]  Dental Assistance  If unable  to pay or uninsured, contact:  [facility] or [facility]. to become qualified for the adult dental clinic.  Patients with Medicaid: [facility] [address], (780)225-9360 [address], [phone]  If unable to pay, or uninsured, contact [facility] 812-615-3699) or [facility] [phone] in [place],  [phone] in [facility]) to become qualified for the adult dental clinic  Other Low-Cost Community Dental Services: - [facility]- [address] [place], [place], [place], [address], [phone], Ext. [address], 2nd and 4th Thursday of the month at 6:30am.  10 clients each day by appointment, can sometimes see walk-in patients if someone does not show for an appointment. [facility]- [address] [place], [place], [address], [phone] - [facility]- [address] [place], [place], [address], [phone] - [facility]- [phone] [facility]- [phone] [facility] Department- [phone]

## 2012-01-29 NOTE — ED Provider Notes (Signed)
Medical screening examination/treatment/procedure(s) were performed by non-physician practitioner and as supervising physician I was immediately available for consultation/collaboration.   Carleene Cooper III, MD 01/29/12 1321

## 2013-06-11 ENCOUNTER — Other Ambulatory Visit: Payer: Self-pay | Admitting: Gastroenterology

## 2013-09-11 ENCOUNTER — Ambulatory Visit (INDEPENDENT_AMBULATORY_CARE_PROVIDER_SITE_OTHER): Payer: 59

## 2013-09-11 ENCOUNTER — Ambulatory Visit (INDEPENDENT_AMBULATORY_CARE_PROVIDER_SITE_OTHER): Payer: 59 | Admitting: Podiatry

## 2013-09-11 ENCOUNTER — Encounter: Payer: Self-pay | Admitting: Podiatry

## 2013-09-11 ENCOUNTER — Ambulatory Visit: Payer: Self-pay | Admitting: Podiatry

## 2013-09-11 VITALS — BP 151/93 | HR 93 | Resp 16 | Ht 61.0 in | Wt 140.0 lb

## 2013-09-11 DIAGNOSIS — M6740 Ganglion, unspecified site: Secondary | ICD-10-CM

## 2013-09-11 NOTE — Progress Notes (Signed)
   Subjective:    Patient ID: Meghan Padilla, female    DOB: 12-16-1950, 63 y.o.   MRN: [id number]  HPI Comments: "I have a cyst"  Pt has small soft knot dorsal lateral foot for several years but now more recently has been hurting some. The knot has not changed in size. Does rub on shoes. No treatment.     Review of Systems  All other systems reviewed and are negative.       Objective:   Physical Exam        Assessment & Plan:

## 2013-09-11 NOTE — Progress Notes (Signed)
Subjective:     Patient ID: Meghan Padilla, female   DOB: 15-Jan-1951, 63 y.o.   MRN: [id number]  HPI patient presents stating I have a nodule on the top of my right foot that became painful and a lesion underneath my left eye like to treat Sunday. States the nodule has been present for several year   Review of Systems  All other systems reviewed and are negative.       Objective:   Physical Exam  Nursing note and vitals reviewed. Constitutional: She is oriented to person, place, and time.  Cardiovascular: Intact distal pulses.   Musculoskeletal: Normal range of motion.  Neurological: She is oriented to person, place, and time.  Skin: Skin is warm.   neurovascular status is intact with a small nodule in the lateral side of the right foot measuring about 4 x 4 mm which is within subcutaneous tissue and appears to be movable. Small lesion plantar left foot muscle strength adequate and no equinus condition noted     Assessment:     Probable ganglionic cyst right possible verruca left    Plan:     H&P and x-ray reviewed right I did a proximal nerve block I aspirated the area was able to get out a quarter cc of thick fluid. I then applied a small amount of steroid and compressed and advised on compression for the next several weeks and reappoint her recheck this and the left foot

## 2013-09-26 ENCOUNTER — Ambulatory Visit (INDEPENDENT_AMBULATORY_CARE_PROVIDER_SITE_OTHER): Payer: 59 | Admitting: Podiatry

## 2013-09-26 ENCOUNTER — Encounter: Payer: Self-pay | Admitting: Podiatry

## 2013-09-26 VITALS — BP 133/75 | HR 92 | Resp 16

## 2013-09-26 DIAGNOSIS — M6740 Ganglion, unspecified site: Secondary | ICD-10-CM

## 2013-09-26 DIAGNOSIS — B079 Viral wart, unspecified: Secondary | ICD-10-CM

## 2013-09-26 NOTE — Progress Notes (Signed)
Subjective:     Patient ID: Meghan Padilla, female   DOB: November 03, 1950, 63 y.o.   MRN: [id number]  HPI patient states her right foot is feeling good and the left foot lateral side shows keratotic lesion that has been present for about 2 years and is painful when she walks on   Review of Systems     Objective:   Physical Exam Neurovascular status intact with keratotic lesion lateral side left foot measuring about 6 x 6 mm that shows indications of scar tissue and possible viral type tissue    Assessment:     Possible verruca plantaris left versus scar tissue    Plan:     Educated patient on this and today debrided the tissue and applied chemical agent under occlusion with instructions on what to do if blistering should occur. Reappoint 4 weeks

## 2013-09-26 NOTE — Patient Instructions (Signed)

## 2013-10-03 ENCOUNTER — Ambulatory Visit (INDEPENDENT_AMBULATORY_CARE_PROVIDER_SITE_OTHER): Payer: 59 | Admitting: Podiatry

## 2013-10-03 ENCOUNTER — Encounter: Payer: Self-pay | Admitting: Podiatry

## 2013-10-03 VITALS — BP 130/78 | HR 70 | Resp 14 | Ht 61.0 in | Wt 140.0 lb

## 2013-10-03 DIAGNOSIS — B079 Viral wart, unspecified: Secondary | ICD-10-CM

## 2013-10-03 DIAGNOSIS — M6740 Ganglion, unspecified site: Secondary | ICD-10-CM

## 2013-10-03 NOTE — Progress Notes (Signed)
Pt states the area the chemical was applied is still painful, difficult to walk or wear shoes.  The left lateral heel has an area of dried blistered skin.

## 2013-10-03 NOTE — Progress Notes (Signed)
Subjective:     Patient ID: Meghan Padilla, female   DOB: 01/12/1951, 63 y.o.   MRN: [id number]  HPI patient states that the outside of my left foot remains tender at the to the ward off and it was draining and I wanted to get it checked. Ganglionic cyst doing well on the right foot   Review of Systems     Objective:   Physical Exam Neurovascular status intact no health history changes noted with area of irritation on the plantar lateral aspect left foot secondary to chemical application to verruca plantaris. Right foot healing well    Assessment:     Reaction to chemical left foot with inflammation and discomfort noted of the localized nature    Plan:     Debrided tissue with no drainage noted at the current time and applied sterile dressing with Silvadene to be worn for the next 3 days. If any swelling or drainage were to occur patient is to let us know

## 2013-10-24 ENCOUNTER — Ambulatory Visit: Payer: 59 | Admitting: Podiatry

## 2014-01-15 ENCOUNTER — Ambulatory Visit (INDEPENDENT_AMBULATORY_CARE_PROVIDER_SITE_OTHER): Payer: 59 | Admitting: Podiatry

## 2014-01-15 VITALS — BP 134/83 | HR 98 | Resp 16

## 2014-01-15 DIAGNOSIS — M6740 Ganglion, unspecified site: Secondary | ICD-10-CM

## 2014-01-15 DIAGNOSIS — D492 Neoplasm of unspecified behavior of bone, soft tissue, and skin: Secondary | ICD-10-CM

## 2014-01-15 NOTE — Progress Notes (Signed)
Subjective:     Patient ID: Meghan Padilla, female   DOB: 22-Jan-1951, 63 y.o.   MRN: [id number]  HPI patient presents with a small nodule on the dorsum of the right foot that is freely movable within subcutaneous tissue and appears to be quite organized   Review of Systems     Objective:   Physical Exam Neurovascular status intact with on the dorsal lateral aspect of the right foot a small freely movable nodule which measures estimated 3 x 3 mm and has a relatively hard-core    Assessment:     Possible ganglionic or other soft tissue condition.    Plan:     Careful injection administered under to try to shrink it with advice that if it should grow become painful that it may need to be removed in the future

## 2015-02-06 ENCOUNTER — Other Ambulatory Visit: Payer: Self-pay | Admitting: Obstetrics and Gynecology

## 2015-02-09 LAB — CYTOLOGY - PAP

## 2015-08-23 DIAGNOSIS — C801 Malignant (primary) neoplasm, unspecified: Secondary | ICD-10-CM

## 2015-08-23 HISTORY — DX: Malignant (primary) neoplasm, unspecified: C80.1

## 2016-02-17 ENCOUNTER — Other Ambulatory Visit: Payer: Self-pay | Admitting: Obstetrics and Gynecology

## 2016-02-17 DIAGNOSIS — R928 Other abnormal and inconclusive findings on diagnostic imaging of breast: Secondary | ICD-10-CM

## 2016-02-24 ENCOUNTER — Ambulatory Visit
Admission: RE | Admit: 2016-02-24 | Discharge: 2016-02-24 | Disposition: A | Discharge status: Home / Self Care | Payer: 59 | Source: Ambulatory Visit | Attending: Obstetrics and Gynecology | Admitting: Obstetrics and Gynecology

## 2016-02-24 ENCOUNTER — Other Ambulatory Visit: Payer: Self-pay | Admitting: Obstetrics and Gynecology

## 2016-02-24 DIAGNOSIS — R928 Other abnormal and inconclusive findings on diagnostic imaging of breast: Secondary | ICD-10-CM

## 2016-02-24 DIAGNOSIS — N6310 Unspecified lump in the right breast, unspecified quadrant: Secondary | ICD-10-CM

## 2016-02-26 ENCOUNTER — Other Ambulatory Visit: Payer: Self-pay | Admitting: Obstetrics and Gynecology

## 2016-02-26 DIAGNOSIS — N6310 Unspecified lump in the right breast, unspecified quadrant: Secondary | ICD-10-CM

## 2016-02-29 ENCOUNTER — Ambulatory Visit
Admission: RE | Admit: 2016-02-29 | Discharge: 2016-02-29 | Disposition: A | Discharge status: Home / Self Care | Payer: 59 | Source: Ambulatory Visit | Attending: Obstetrics and Gynecology | Admitting: Obstetrics and Gynecology

## 2016-02-29 DIAGNOSIS — N6310 Unspecified lump in the right breast, unspecified quadrant: Secondary | ICD-10-CM

## 2016-02-29 HISTORY — PX: BREAST BIOPSY: SHX20

## 2016-03-07 ENCOUNTER — Ambulatory Visit: Payer: Self-pay | Admitting: Surgery

## 2016-03-07 DIAGNOSIS — C50411 Malignant neoplasm of upper-outer quadrant of right female breast: Secondary | ICD-10-CM

## 2016-03-07 NOTE — H&P (Signed)
Meghan Padilla 03/07/2016 9:02 AM Location: [facility] Patient #: [id number] DOB: 10/31/1950 Married / Language: English / Race: White Female  History of Present Illness (Meghan Padilla A. [name] MD; 03/07/2016 12:23 PM) Patient words: Patient sent request of Dr. Herringshaw for an abnormal mammogram. Patient underwent screening mammogram showed a 5 mm area of distortion in the right breast upper outer quadrant. Core biopsy showed invasive ductal carcinoma ER positive. Positive with a proliferation rate of 10%. Patient denies history of breast pain, breast mass or nipple discharge bilaterally. No family history of breast or ovarian cancer.               ADDITIONAL INFORMATION: PROGNOSTIC INDICATORS Results: IMMUNOHISTOCHEMICAL AND MORPHOMETRIC ANALYSIS PERFORMED MANUALLY Estrogen Receptor: 95%, POSITIVE, STRONG STAINING INTENSITY Progesterone Receptor: 2%, POSITIVE, STRONG STAINING INTENSITY Proliferation Marker Ki67: 10% REFERENCE RANGE ESTROGEN RECEPTOR NEGATIVE 0% POSITIVE =>1% REFERENCE RANGE PROGESTERONE RECEPTOR NEGATIVE 0% POSITIVE =>1% All controls stained appropriately [name] Pathologist, Electronic Signature ( Signed 03/03/2016) FINAL DIAGNOSIS Diagnosis Breast, right, needle core biopsy, 12:00 o'clock, 8 CMFN - INVASIVE DUCTAL CARCINOMA. - SEE MICROSCOPIC DESCRIPTION. Microscopic Comment The findings are consistent with grade 2 invasive ductal carcinoma. A breast prognostic profile will be performed. 1 of 2 FINAL for Padilla, Meghan W ([id number]) Microscopic Comment(continued) Dr Manny agrees. Called to [facility] on 03/01/2016. (JDP:ecj 03/01/2016) [name] Pathologist, Electronic Signature (Case signed 03/01/2016) Specimen Gross and Clinical Information Specimen Comment In formalin 13:00, extracted <1 min; irregular right breast mass Specimen(s) Obtained: Breast, right, needle core biopsy, 12:00 o'clock,  8 CMFN Specimen Clinical Information Suspicious for carcinoma Gross Received in formalin are 3 cores of soft, tan-red tissue which measure 1.0 x 0.2 x 0.2 cm and up to 1.5 x 0.2 x 0.2 cm. Submitted in toto in 1 block(s). Time in Formalin 01        CLINICAL DATA: 65-year-old female presenting for screening recall of a possible right breast distortion and a right breast mass, possibly a lymph node.  EXAM: 2D DIGITAL DIAGNOSTIC UNILATERAL RIGHT MAMMOGRAM WITH CAD AND ADJUNCT TOMO  RIGHT BREAST ULTRASOUND  COMPARISON: Previous exam(s).  ACR Breast Density Category c: The breast tissue is heterogeneously dense, which may obscure small masses.  FINDINGS: In the superior aspect of the right breast, posterior depth there is a persistent site of distortion. The mass of concern seen on the MLO view is consistent with a lymph node on the spot compression tomosynthesis image.  Mammographic images were processed with CAD.  No palpable mass can be identified in the superior aspect of the right breast.  Ultrasound targeted to the right breast at 12 o'clock, 8 cm from the nipple demonstrates a hypoechoic irregular mass with spiculated margins measuring 5 x 5 x 5 mm. Blood flow is documented within the mass on color Doppler imaging. Ultrasound of the right axilla demonstrates multiple normal-appearing lymph nodes.  IMPRESSION: 1. There is a suspicious mass in the right breast at 12 o'clock.  2. The mass of concern on the screening mammogram in the low right axilla corresponds with a benign-appearing lymph node.  3. No evidence of right axillary lymphadenopathy.  RECOMMENDATION: Ultrasound-guided biopsy is recommended for the right breast mass at 12 o'clock. This has been scheduled for 02/29/2016 at 12:30 p.m.  I have discussed the findings and recommendations with the patient. Results were also provided in writing at the conclusion of the visit. If applicable, a reminder  letter will be sent to the   patient regarding the next appointment.  BI-RADS CATEGORY 4: Suspicious.   Electronically Signed By: Michelle Collins M.D. On: 02/24/2016 12:54.  The patient is a 65 year old female.   Other Problems (Ashley Beck, CMA; 03/07/2016 9:02 AM) Breast Cancer Diabetes Mellitus High blood pressure Hypercholesterolemia  Past Surgical History (Ashley Beck, CMA; 03/07/2016 9:02 AM) Breast Biopsy Right. Colon Polyp Removal - Colonoscopy  Allergies (Ashley Beck, CMA; 03/07/2016 9:04 AM) Sulfa 10 *OPHTHALMIC AGENTS* Nausea, Vomiting.  Medication History (Ashley Beck, CMA; 03/07/2016 9:03 AM) MetFORMIN HCl ER (500MG Tablet ER 24HR, Oral three times daily) Active. Atorvastatin Calcium (40MG Tablet, Oral) Active. Omeprazole (20MG Capsule DR, Oral) Active. Quinapril-Hydrochlorothiazide (20-25MG Tablet, Oral) Active. VESIcare (5MG Tablet, Oral two times daily) Active. Januvia (100MG Tablet, Oral) Active. Medications Reconciled  Social History (Ashley Beck, CMA; 03/07/2016 9:02 AM) Caffeine use Coffee, Tea. No alcohol use No drug use  Pregnancy / Birth History (Ashley Beck, CMA; 03/07/2016 9:02 AM) Age at menarche 14 years. Age of menopause 51-55 Gravida 2 Irregular periods Length (months) of breastfeeding 3-6 Maternal age 26-30 Para 2     Review of Systems (Ashley Beck CMA; 03/07/2016 9:02 AM) General Not Present- Appetite Loss, Chills, Fatigue, Fever, Night Sweats, Weight Gain and Weight Loss. Skin Not Present- Change in Wart/Mole, Dryness, Hives, Jaundice, New Lesions, Non-Healing Wounds, Rash and Ulcer. HEENT Present- Wears glasses/contact lenses. Not Present- Earache, Hearing Loss, Hoarseness, Nose Bleed, Oral Ulcers, Ringing in the Ears, Seasonal Allergies, Sinus Pain, Sore Throat, Visual Disturbances and Yellow Eyes. Female Genitourinary Present- Frequency and Urgency. Not Present- Nocturia, Painful Urination and Pelvic  Pain. Musculoskeletal Not Present- Back Pain, Joint Pain, Joint Stiffness, Muscle Pain, Muscle Weakness and Swelling of Extremities. Neurological Not Present- Decreased Memory, Fainting, Headaches, Numbness, Seizures, Tingling, Tremor, Trouble walking and Weakness. Psychiatric Not Present- Anxiety, Bipolar, Change in Sleep Pattern, Depression, Fearful and Frequent crying. Hematology Not Present- Blood Thinners, Easy Bruising, Excessive bleeding, Gland problems, HIV and Persistent Infections.  Vitals (Ashley Beck CMA; 03/07/2016 9:04 AM) 03/07/2016 9:03 AM Weight: 136 lb Height: 61in Body Surface Area: 1.6 m Body Mass Index: 25.7 kg/m  Temp.: 98F(Temporal)  Pulse: 100 (Regular)  BP: 124/68 (Sitting, Left Arm, Standard)      Physical Exam (Yannick Steuber A. Jacy Howat MD; 03/07/2016 12:23 PM)  General Mental Status-Alert. General Appearance-Consistent with stated age. Hydration-Well hydrated. Voice-Normal.  Head and Neck Head-normocephalic, atraumatic with no lesions or palpable masses. Trachea-midline. Thyroid Gland Characteristics - normal size and consistency.  Chest and Lung Exam Chest and lung exam reveals -quiet, even and easy respiratory effort with no use of accessory muscles and on auscultation, normal breath sounds, no adventitious sounds and normal vocal resonance. Inspection Chest Wall - Normal. Back - normal.  Breast Breast - Left-Symmetric, Non Tender, No Biopsy scars, no Dimpling, No Inflammation, No Lumpectomy scars, No Mastectomy scars, No Peau d' Orange. Breast - Right-Symmetric, Non Tender, No Biopsy scars, no Dimpling, No Inflammation, No Lumpectomy scars, No Mastectomy scars, No Peau d' Orange. Breast Lump-No Palpable Breast Mass. Note: Small hematoma at biopsy site.  Cardiovascular Cardiovascular examination reveals -normal heart sounds, regular rate and rhythm with no murmurs and normal pedal pulses  bilaterally.  Neurologic Neurologic evaluation reveals -alert and oriented x 3 with no impairment of recent or remote memory. Mental Status-Normal.  Musculoskeletal Normal Exam - Left-Upper Extremity Strength Normal and Lower Extremity Strength Normal. Normal Exam - Right-Upper Extremity Strength Normal and Lower Extremity Strength Normal.  Lymphatic Head & Neck  General Head & Neck   Lymphatics: Bilateral - Description - Normal. Axillary  General Axillary Region: Bilateral - Description - Normal. Tenderness - Non Tender.    Assessment & Plan (Liala Codispoti A. Javaris Wigington MD; 03/07/2016 12:24 PM)  BREAST CANCER, RIGHT (C50.911) Impression: Discussed breast conservation versus mastectomy with reconstruction. Risks and benefits as well as long-term expectations and additional treatments of each discussed. Patient is opted for right breast seed localized partial mastectomy and right axillary sentinel lymph node mapping. Discussed risk of bleeding, infection, seroma formation, cosmetic deformity, some lipedema, numbness, stiffness, and other potential treatments and/or complications of surgery and treatments after that fact. Risk of lumpectomy include bleeding, infection, seroma, more surgery, use of seed/wire, wound care, cosmetic deformity and the need for other treatments, death , blood clots, death. Pt agrees to proceed. Risk of sentinel lymph node mapping include bleeding, infection, lymphedema, shoulder pain. stiffness, dye allergy. cosmetic deformity , blood clots, death, need for more surgery. Pt agres to proceed.  Current Plans Referred to Genetic Counseling, for evaluation and follow up (Medical Genetics). Routine. Referred to Surgery - Plastic, for evaluation and follow up (Plastic Surgery). Routine. Referred to Oncology, for evaluation and follow up (Oncology). Routine. Referred to Radiation Oncology, for evaluation and follow up (Radiation Oncology). Routine. Referred to Physical  Therapy, for evaluation and follow up (Physical Therapy). Routine. We discussed the staging and pathophysiology of breast cancer. We discussed all of the different options for treatment for breast cancer including surgery, chemotherapy, radiation therapy, Herceptin, and antiestrogen therapy. We discussed a sentinel lymph node biopsy as she does not appear to having lymph node involvement right now. We discussed the performance of that with injection of radioactive tracer and blue dye. We discussed that she would have an incision underneath her axillary hairline. We discussed that there is a bout a 10-20% chance of having a positive node with a sentinel lymph node biopsy and we will await the permanent pathology to make any other first further decisions in terms of her treatment. One of these options might be to return to the operating room to perform an axillary lymph node dissection. We discussed about a 1-2% risk lifetime of chronic shoulder pain as well as lymphedema associated with a sentinel lymph node biopsy. We discussed the options for treatment of the breast cancer which included lumpectomy versus a mastectomy. We discussed the performance of the lumpectomy with a wire placement. We discussed a 10-20% chance of a positive margin requiring reexcision in the operating room. We also discussed that she may need radiation therapy or antiestrogen therapy or both if she undergoes lumpectomy. We discussed the mastectomy and the postoperative care for that as well. We discussed that there is no difference in her survival whether she undergoes lumpectomy with radiation therapy or antiestrogen therapy versus a mastectomy. There is a slight difference in the local recurrence rate being 3-5% with lumpectomy and about 1% with a mastectomy. We discussed the risks of operation including bleeding, infection, possible reoperation. She understands her further therapy will be based on what her stages at the time of her  operation.  Pt Education - [facility] breast cancer surgery: discussed with patient and provided information. Pt Education - [facility] Breast Biopsy HCI: discussed with patient and provided information. Pt Education - ABC (After Breast Cancer) Class Info: discussed with patient and provided information.

## 2016-03-08 ENCOUNTER — Telehealth: Payer: Self-pay | Admitting: Oncology

## 2016-03-08 ENCOUNTER — Encounter: Payer: Self-pay | Admitting: Oncology

## 2016-03-08 NOTE — Telephone Encounter (Signed)
Appointment with Magrinat on 7/27 at 4:30pm. Patient aware to arrive 30 minutes early. Demographics verified. Letter to referring

## 2016-03-16 ENCOUNTER — Other Ambulatory Visit: Payer: Self-pay

## 2016-03-16 ENCOUNTER — Other Ambulatory Visit: Payer: Self-pay | Admitting: Surgery

## 2016-03-16 DIAGNOSIS — C50411 Malignant neoplasm of upper-outer quadrant of right female breast: Secondary | ICD-10-CM

## 2016-03-17 ENCOUNTER — Ambulatory Visit (HOSPITAL_BASED_OUTPATIENT_CLINIC_OR_DEPARTMENT_OTHER): Payer: 59 | Admitting: Oncology

## 2016-03-17 ENCOUNTER — Encounter (HOSPITAL_BASED_OUTPATIENT_CLINIC_OR_DEPARTMENT_OTHER): Payer: Self-pay | Admitting: *Deleted

## 2016-03-17 ENCOUNTER — Other Ambulatory Visit (HOSPITAL_BASED_OUTPATIENT_CLINIC_OR_DEPARTMENT_OTHER): Payer: 59

## 2016-03-17 ENCOUNTER — Other Ambulatory Visit: Payer: Self-pay | Admitting: *Deleted

## 2016-03-17 DIAGNOSIS — C50411 Malignant neoplasm of upper-outer quadrant of right female breast: Secondary | ICD-10-CM

## 2016-03-17 DIAGNOSIS — Z853 Personal history of malignant neoplasm of breast: Secondary | ICD-10-CM | POA: Insufficient documentation

## 2016-03-17 DIAGNOSIS — Z170 Estrogen receptor positive status [ER+]: Secondary | ICD-10-CM | POA: Diagnosis not present

## 2016-03-17 LAB — CBC WITH DIFFERENTIAL/PLATELET
BASO%: 0.3 % (ref 0.0–2.0)
BASOS ABS: 0 10*3/uL (ref 0.0–0.1)
EOS ABS: 0.1 10*3/uL (ref 0.0–0.5)
EOS%: 1.3 % (ref 0.0–7.0)
HEMATOCRIT: 37.7 % (ref 34.8–46.6)
HEMOGLOBIN: 12.5 g/dL (ref 11.6–15.9)
LYMPH#: 3 10*3/uL (ref 0.9–3.3)
LYMPH%: 32.2 % (ref 14.0–49.7)
MCH: 27.6 pg (ref 25.1–34.0)
MCHC: 33.2 g/dL (ref 31.5–36.0)
MCV: 83.2 fL (ref 79.5–101.0)
MONO#: 0.5 10*3/uL (ref 0.1–0.9)
MONO%: 5.6 % (ref 0.0–14.0)
NEUT#: 5.6 10*3/uL (ref 1.5–6.5)
NEUT%: 60.6 % (ref 38.4–76.8)
PLATELETS: 227 10*3/uL (ref 145–400)
RBC: 4.53 10*6/uL (ref 3.70–5.45)
RDW: 14.1 % (ref 11.2–14.5)
WBC: 9.3 10*3/uL (ref 3.9–10.3)

## 2016-03-17 LAB — COMPREHENSIVE METABOLIC PANEL
ALBUMIN: 3.7 g/dL (ref 3.5–5.0)
ALK PHOS: 78 U/L (ref 40–150)
ALT: 26 U/L (ref 0–55)
ANION GAP: 11 meq/L (ref 3–11)
AST: 23 U/L (ref 5–34)
BUN: 8.4 mg/dL (ref 7.0–26.0)
CALCIUM: 9.1 mg/dL (ref 8.4–10.4)
CO2: 27 mEq/L (ref 22–29)
Chloride: 104 mEq/L (ref 98–109)
Creatinine: 0.9 mg/dL (ref 0.6–1.1)
EGFR: 70 mL/min/{1.73_m2} — AB (ref >90)
Glucose: 150 mg/dl — ABNORMAL HIGH (ref 70–140)
POTASSIUM: 3.3 meq/L — AB (ref 3.5–5.1)
Sodium: 142 mEq/L (ref 136–145)
Total Bilirubin: 0.34 mg/dL (ref 0.20–1.20)
Total Protein: 6.6 g/dL (ref 6.4–8.3)

## 2016-03-17 NOTE — Progress Notes (Signed)
[place]  [phone] Fax:(336) [phone]     ID: [name] DOB: [birth year]  MR#: [id number]  LKG#:[id number]  Patient Care Team: [name], MD as PCP - General (Family Medicine) Chauncey Cruel, MD as Consulting Physician (Oncology) Erroll Luna, MD as Consulting Physician (General Surgery) Gery Pray, MD as Consulting Physician (Radiation Oncology) Arvella Nigh, MD (Obstetrics and Gynecology) Franchot Gallo, MD as Consulting Physician (Urology) Wilford Corner, MD as Consulting Physician (Gastroenterology) OTHER MD:  CHIEF COMPLAINT: Estrogen receptor positive breast cancer  CURRENT TREATMENT: Awaiting definitive surgery   BREAST CANCER HISTORY: Meghan Padilla [facility] showing a possible right breast distortion and or mass and possibly a right axillary lymph node. She was recalled for diagnostic right mammography with tomography and ultrasonography at the [facility] 02/24/2016. This found her right breast density to be category C. In the upper right breast there was a site of distortion. The mass of concern was consistent with an intramamm [name]. On exam there was no palpable abnormality. Ultrasonography found a hypoechoic irregular mass with spiculated margins at the 12:00 position of the right breast measuring 0.5 cm. Ultrasound of the right axilla showed multiple normal-appearing lymph nodes.  Biopsy of the right breast mass in question 02/29/2016 showed orifices [id number]) and invasive ductal carcinoma, grade 2, estrogen receptor 95% positive, with strong staining intensity, progesterone receptor 2% positive, with strong staining intensity, with an MIB-1 of 10%, and no HER-2 amplification, the signals ratio being 1.48 and the number per cell 2.30.  Her subsequent history is as detailed below.  INTERVAL HISTORY: Meghan Padilla was evaluated in the breast clinic  03/17/2016 accompanied by her husband [name]. Her case was also presented in the multidisciplinary breast cancer conference  03/09/2016 At that time a preliminary plan was proposed: Breast conserving surgery, with sentinel lymph node sampling, with consideration of systemic treatment depending on the size of the tumor; and adjuvant radiation.  REVIEW OF SYSTEMS: There were no specific symptoms leading to the original mammogram, which was routinely scheduled. The patient denies unusual headaches, visual changes, nausea, vomiting, stiff neck, dizziness, or gait imbalance. There has been no cough, phlegm production, or pleurisy, no chest pain or pressure, and no change in bowel or bladder habits. The patient denies fever, rash, bleeding, unexplained fatigue or unexplained weight loss. [name] admits to problems with stress urinary incontinence. He A detailed review of systems was otherwise entirely negative.   PAST MEDICAL HISTORY: Past Medical History:  Diagnosis Date  . Diabetes mellitus   . Hyperlipidemia   . Hypertension   . UTI (urinary tract infection)     PAST SURGICAL HISTORY: Past Surgical History:  Procedure Laterality Date  . HAND SURGERY    . HYSTEROSCOPY W/D&C  11/28/2008  . TONSILLECTOMY      FAMILY HISTORY No family history on file. The patient's parents are still living, in their late 68s. The patient had no brothers, 2 sisters. There is no history of breast or ovarian or any other cancer in the family to the patient's knowledge   GYNECOLOGIC HISTORY:  No LMP recorded. Patient is postmenopausal.  menarche age 65, first live birth age 36. The patient went through menopause in her early 65s. She did not take hormone replacement. 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 Her husband  [name] was a Dealer at [employer]. He is retired and disabled  after multiple back surgeries. Daughter [name] lives in [place] and is disabled secondary to cognitive and behavioral issues. Son [name] lives in [place] where he does car painting and body work. The patient has no grandchildren. She is a Tourist information centre manager.    ADVANCED DIRECTIVES: Not in place    HEALTH MAINTENANCE: Social History  Substance Use Topics  . Smoking status: Never Smoker  . Smokeless tobacco: Not on file  . Alcohol use No     Colonoscopy:  PAP:  Bone density:   Allergies  Allergen Reactions  . Sulfa Antibiotics Nausea Only    dizzy    Current Outpatient Prescriptions  Medication Sig Dispense Refill  . atorvastatin (LIPITOR) 40 MG tablet Take 40 mg by mouth daily.    . metFORMIN (GLUCOPHAGE) 500 MG tablet Take 1,500 mg by mouth at bedtime.     Marland Kitchen omeprazole (PRILOSEC) 20 MG capsule Take 20 mg by mouth daily.    . quinapril-hydrochlorothiazide (ACCURETIC) 20-25 MG per tablet Take 1 tablet by mouth daily.    . sitaGLIPtin (JANUVIA) 100 MG tablet Take 100 mg by mouth daily.    . solifenacin (VESICARE) 10 MG tablet Take 10 mg by mouth daily.     No current facility-administered medications for this visit.     OBJECTIVE: Middle-aged white woman who appears younger than stated age  65:   03/17/16 1549  BP: (!) 146/90  Pulse: 91  Resp: 18  Temp: 98.3 F (36.8 C)     Body mass index is 25.6 kg/m.    ECOG FS:0 - Asymptomatic  Ocular: Sclerae unicteric, pupils equal, round and reactive to light Ear-nose-throat: Oropharynx clear and moist Lymphatic: No cervical or supraclavicular adenopathy Lungs no rales or rhonchi, good excursion bilaterally Heart regular rate and rhythm, no murmur appreciated Abd soft, nontender, positive bowel sounds MSK no focal spinal tenderness, no joint edema Neuro: non-focal, well-oriented, appropriate affect Breasts: I do not palpate a mass in the right breast. There is no skin or nipple changes of concern. The  right axilla is benign. The left breast is unremarkable.    LAB RESULTS:  CMP     Component Value Date/Time   NA 142 03/17/2016 1540   K 3.3 (L) 03/17/2016 1540   CL 104 11/27/2008 1326   CO2 27 03/17/2016 1540   GLUCOSE 150 (H) 03/17/2016 1540   BUN 8.4 03/17/2016 1540   CREATININE 0.9 03/17/2016 1540   CALCIUM 9.1 03/17/2016 1540   PROT 6.6 03/17/2016 1540   ALBUMIN 3.7 03/17/2016 1540   AST 23 03/17/2016 1540   ALT 26 03/17/2016 1540   ALKPHOS 78 03/17/2016 1540   BILITOT 0.34 03/17/2016 1540   GFRNONAA >60 11/27/2008 1326   GFRAA  11/27/2008 1326    >60        The eGFR has been calculated using the MDRD equation. This calculation has not been validated in all clinical situations. eGFR's persistently <60 mL/min signify possible Chronic Kidney Disease.    INo results found for: SPEP, UPEP  Lab Results  Component Value Date   WBC 9.3 03/17/2016   NEUTROABS 5.6 03/17/2016   HGB 12.5 03/17/2016   HCT 37.7 03/17/2016   MCV 83.2 03/17/2016   PLT 227 03/17/2016      Chemistry      Component Value Date/Time   NA 142 03/17/2016 1540   K 3.3 (L) 03/17/2016 1540  CL 104 11/27/2008 1326   CO2 27 03/17/2016 1540   BUN 8.4 03/17/2016 1540   CREATININE 0.9 03/17/2016 1540      Component Value Date/Time   CALCIUM 9.1 03/17/2016 1540   ALKPHOS 78 03/17/2016 1540   AST 23 03/17/2016 1540   ALT 26 03/17/2016 1540   BILITOT 0.34 03/17/2016 1540       No results found for: LABCA2  No components found for: LABCA125  No results for input(s): INR in the last 168 hours.  Urinalysis    Component Value Date/Time   COLORURINE YELLOW 01/28/2012 1952   APPEARANCEUR CLEAR 01/28/2012 1952   LABSPEC 1.006 01/28/2012 1952   PHURINE 7.0 01/28/2012 1952   GLUCOSEU NEGATIVE 01/28/2012 1952   HGBUR NEGATIVE 01/28/2012 1952   BILIRUBINUR NEGATIVE 01/28/2012 1952   KETONESUR NEGATIVE 01/28/2012 1952   PROTEINUR NEGATIVE 01/28/2012 1952   UROBILINOGEN 0.2 01/28/2012  1952   NITRITE NEGATIVE 01/28/2012 1952   LEUKOCYTESUR TRACE (A) 01/28/2012 1952     STUDIES: Mm Digital Diagnostic Unilat R  Result Date: 02/29/2016 CLINICAL DATA:  Status post ultrasound-guided right breast biopsy EXAM: DIAGNOSTIC RIGHT MAMMOGRAM POST ULTRASOUND BIOPSY COMPARISON:  Previous exam(s). FINDINGS: Mammographic images were obtained following ultrasound guided biopsy of a suspicious right breast mass at 12 o'clock, 8 cm from the nipple. Post biopsy mammogram demonstrates the ribbon shaped biopsy marker to be in the expected location within the superior right breast. IMPRESSION: Appropriate marker position as above. Final Assessment: Post Procedure Mammograms for Marker Placement Electronically Signed   By: Pamelia Hoit M.D.   On: 02/29/2016 13:40   US Breast Ltd Uni Right Inc Axilla  Result Date: 02/24/2016 CLINICAL DATA:  65 year old female presenting for screening recall of a possible right breast distortion and a right breast mass, possibly a lymph node. EXAM: 2D DIGITAL DIAGNOSTIC UNILATERAL RIGHT MAMMOGRAM WITH CAD AND ADJUNCT TOMO RIGHT BREAST ULTRASOUND COMPARISON:  Previous exam(s). ACR Breast Density Category c: The breast tissue is heterogeneously dense, which may obscure small masses. FINDINGS: In the superior aspect of the right breast, posterior depth there is a persistent site of distortion. The mass of concern seen on the MLO view is consistent with a lymph node on the spot compression tomosynthesis image. Mammographic images were processed with CAD. No palpable mass can be identified in the superior aspect of the right breast. Ultrasound targeted to the right breast at 12 o'clock, 8 cm from the nipple demonstrates a hypoechoic irregular mass with spiculated margins measuring 5 x 5 x 5 mm. Blood flow is documented within the mass on color Doppler imaging. Ultrasound of the right axilla demonstrates multiple normal-appearing lymph nodes. IMPRESSION: 1. There is a suspicious mass  in the right breast at 12 o'clock. 2. The mass of concern on the screening mammogram in the low right axilla corresponds with a benign-appearing lymph node. 3.  No evidence of right axillary lymphadenopathy. RECOMMENDATION: Ultrasound-guided biopsy is recommended for the right breast mass at 12 o'clock. This has been scheduled for 02/29/2016 at 12:30 p.m. I have discussed the findings and recommendations with the patient. Results were also provided in writing at the conclusion of the visit. If applicable, a reminder letter will be sent to the patient regarding the next appointment. BI-RADS CATEGORY  4: Suspicious. Electronically Signed   By: Ammie Ferrier M.D.   On: 02/24/2016 12:54   Mm Diag Breast Tomo Uni Right  Result Date: 02/24/2016 CLINICAL DATA:  65 year old female presenting for screening recall of a  possible right breast distortion and a right breast mass, possibly a lymph node. EXAM: 2D DIGITAL DIAGNOSTIC UNILATERAL RIGHT MAMMOGRAM WITH CAD AND ADJUNCT TOMO RIGHT BREAST ULTRASOUND COMPARISON:  Previous exam(s). ACR Breast Density Category c: The breast tissue is heterogeneously dense, which may obscure small masses. FINDINGS: In the superior aspect of the right breast, posterior depth there is a persistent site of distortion. The mass of concern seen on the MLO view is consistent with a lymph node on the spot compression tomosynthesis image. Mammographic images were processed with CAD. No palpable mass can be identified in the superior aspect of the right breast. Ultrasound targeted to the right breast at 12 o'clock, 8 cm from the nipple demonstrates a hypoechoic irregular mass with spiculated margins measuring 5 x 5 x 5 mm. Blood flow is documented within the mass on color Doppler imaging. Ultrasound of the right axilla demonstrates multiple normal-appearing lymph nodes. IMPRESSION: 1. There is a suspicious mass in the right breast at 12 o'clock. 2. The mass of concern on the screening mammogram  in the low right axilla corresponds with a benign-appearing lymph node. 3.  No evidence of right axillary lymphadenopathy. RECOMMENDATION: Ultrasound-guided biopsy is recommended for the right breast mass at 12 o'clock. This has been scheduled for 02/29/2016 at 12:30 p.m. I have discussed the findings and recommendations with the patient. Results were also provided in writing at the conclusion of the visit. If applicable, a reminder letter will be sent to the patient regarding the next appointment. BI-RADS CATEGORY  4: Suspicious. Electronically Signed   By: Ammie Ferrier M.D.   On: 02/24/2016 12:54   [place] Rt Breast Bx W Loc Dev 1st Lesion Img Bx Spec US Guide  Addendum Date: 03/02/2016   ADDENDUM REPORT: 03/01/2016 15:23 ADDENDUM: Pathology revealed GRADE II INVASIVE DUCTAL CARCINOMA of the Right breast at the 12:00 o'clock location. This was found to be concordant by Dr. Pamelia Hoit. Pathology results were discussed with the patient by telephone. The patient reported doing well after the biopsy with tenderness at the site. Post biopsy instructions and care were reviewed and questions were answered. The patient was encouraged to call [facility] for any additional concerns. Surgical consultation has been arranged with Dr. Erroll Luna at [facility] on March 07, 2016. Pathology results reported by Terie Purser, RN on 03/01/2016. Electronically Signed   By: Pamelia Hoit M.D.   On: 03/01/2016 15:23  Result Date: 03/02/2016 CLINICAL DATA:  65 year old female for ultrasound-guided biopsy of an indeterminate right breast mass at 12 o'clock, 8 cm from the nipple EXAM: ULTRASOUND GUIDED RIGHT BREAST CORE NEEDLE BIOPSY COMPARISON:  Previous exam(s). FINDINGS: I met with the patient and we discussed the procedure of ultrasound-guided biopsy, including benefits and alternatives. We discussed the high likelihood of a successful procedure. We discussed the risks of the procedure,  including infection, bleeding, tissue injury, clip migration, and inadequate sampling. Informed written consent was given. The usual time-out protocol was performed immediately prior to the procedure. Using sterile technique and 1% Lidocaine as local anesthetic, under direct ultrasound visualization, a 12 gauge spring-loaded device was used to perform biopsy of a suspicious right breast mass at 12 o'clock, 8 cm from the nipple using a lateral to medial approach. At the conclusion of the procedure a ribbon shaped tissue marker clip was deployed into the biopsy cavity. Follow up 2 view mammogram was performed and dictated separately. IMPRESSION: Ultrasound guided biopsy of a suspicious right breast mass  at 12 o'clock, 8 cm from the nipple. No apparent complications. Electronically Signed: By: Pamelia Hoit M.D. On: 02/29/2016 13:39    ELIGIBLE FOR AVAILABLE RESEARCH PROTOCOL: no  ASSESSMENT: 65 y.o. [employer] woman status post right breast upper outer quadrant biopsy 02/29/2016 for a clinical T1a N0, stage IA invasive ductal carcinoma, grade 2, estrogen receptor positive, progesterone receptor positive at 2%, HER-2 nonamplified, with an MIB-1 of 10%  (1) breast conserving surgery scheduled for 03/24/2016  (2) systemic treatment depending on size of final excision (Blood likely no chemotherapy)   (3) adjuvant radiation as appropriate  (4) anti-estrogens to follow at the completion of local treatment.   PLAN: We spent the better part of today's hour-long appointment discussing the biology of breast cancer in general, and the specifics of the patient's tumor in particular. We first reviewed the fact that cancer is not one disease but more than 100 different diseases and that it is important to keep them separate-- otherwise when friends and relatives discuss their own cancer experiences with Synda confusion can result. Similarly we explained that if breast cancer spreads to the bone or liver, the patient  would not have bone cancer or liver cancer, but breast cancer in the bone and breast cancer in the liver: one cancer in three places-- not 3 different cancers which otherwise would have to be treated in 3 different ways.  We discussed the difference between local and systemic therapy. In terms of loco-regional treatment, lumpectomy plus radiation is equivalent to mastectomy as far as survival is concerned. For this reason, and because the cosmetic results are generally superior, we recommend breast conserving surgery. Grae is comfortable with that recommendation  We then discussed the rationale for systemic therapy. There is some risk that this cancer may have already spread to other parts of her body. Patients frequently ask at this point about bone scans, CAT scans and PET scans to find out if they have occult breast cancer somewhere else. The problem is that in early stage disease we are much more likely to find false positives then true cancers and this would expose the patient to unnecessary procedures as well as unnecessary radiation. Scans cannot answer the question the patient really would like to know, which is whether she has microscopic disease elsewhere in her body. For those reasons we do not recommend them.  Of course we would proceed to aggressive evaluation of any symptoms that might suggest metastatic disease, but that is not the case here.  Next we went over the options for systemic therapy which are anti-estrogens, anti-HER-2 immunotherapy, and chemotherapy. She does not meet criteria for anti-HER-2 immunotherapy. She is a good candidate for anti-estrogens.   The chemotherapy question is more complex. Chemotherapy is most effective in rapidly growing, aggressive tumors. It is much less effective in not so aggressive, slow growing cancers like Mandeep's. Also, in very small cancers as this one appears to be, the risk of systemic spread was so small that a kind of systemic treatment  including anti-estrogens becomes optional.  Accordingly the plan will be for surgery followed by radiation. If the tumor is greater than a centimeter we will consider an Oncotype but my expectation is that this patient would not benefit from chemotherapy. Accordingly I expect to see her again in mid October but which time she should be done with her local treatment and we can discuss the possible benefits of anti-estrogen therapy.  Teyanna has a good understanding of the overall plan. She agrees  with it. She knows the goal of treatment in her case is cure. She will call with any problems that may develop before her next visit here.  Chauncey Cruel, MD   03/17/2016 5:15 PM Medical Oncology and Hematology [facility] [address] [place], [place] [address] Tel. [phone]    Fax. [phone]

## 2016-03-18 ENCOUNTER — Encounter (HOSPITAL_BASED_OUTPATIENT_CLINIC_OR_DEPARTMENT_OTHER): Payer: Self-pay | Admitting: *Deleted

## 2016-03-21 ENCOUNTER — Encounter (HOSPITAL_BASED_OUTPATIENT_CLINIC_OR_DEPARTMENT_OTHER)
Admission: RE | Admit: 2016-03-21 | Discharge: 2016-03-21 | Disposition: A | Discharge status: Home / Self Care | Payer: 59 | Source: Ambulatory Visit | Attending: Surgery | Admitting: Surgery

## 2016-03-21 DIAGNOSIS — Z79899 Other long term (current) drug therapy: Secondary | ICD-10-CM | POA: Diagnosis not present

## 2016-03-21 DIAGNOSIS — Z170 Estrogen receptor positive status [ER+]: Secondary | ICD-10-CM | POA: Diagnosis not present

## 2016-03-21 DIAGNOSIS — C50911 Malignant neoplasm of unspecified site of right female breast: Secondary | ICD-10-CM | POA: Diagnosis present

## 2016-03-21 DIAGNOSIS — Z7984 Long term (current) use of oral hypoglycemic drugs: Secondary | ICD-10-CM | POA: Diagnosis not present

## 2016-03-21 DIAGNOSIS — K219 Gastro-esophageal reflux disease without esophagitis: Secondary | ICD-10-CM | POA: Diagnosis not present

## 2016-03-21 DIAGNOSIS — D0501 Lobular carcinoma in situ of right breast: Secondary | ICD-10-CM | POA: Diagnosis not present

## 2016-03-21 DIAGNOSIS — E119 Type 2 diabetes mellitus without complications: Secondary | ICD-10-CM | POA: Diagnosis not present

## 2016-03-21 DIAGNOSIS — E7800 Pure hypercholesterolemia, unspecified: Secondary | ICD-10-CM | POA: Diagnosis not present

## 2016-03-21 DIAGNOSIS — I10 Essential (primary) hypertension: Secondary | ICD-10-CM | POA: Diagnosis not present

## 2016-03-21 DIAGNOSIS — C50411 Malignant neoplasm of upper-outer quadrant of right female breast: Secondary | ICD-10-CM | POA: Diagnosis not present

## 2016-03-21 LAB — BASIC METABOLIC PANEL
Anion gap: 8 (ref 5–15)
BUN: 7 mg/dL (ref 6–20)
CALCIUM: 9.4 mg/dL (ref 8.9–10.3)
CHLORIDE: 103 mmol/L (ref 101–111)
CO2: 29 mmol/L (ref 22–32)
CREATININE: 0.75 mg/dL (ref 0.44–1.00)
GFR calc Af Amer: >60 mL/min (ref >60)
GFR calc non Af Amer: >60 mL/min (ref >60)
GLUCOSE: 125 mg/dL — AB (ref 65–99)
Potassium: 3.8 mmol/L (ref 3.5–5.1)
Sodium: 140 mmol/L (ref 135–145)

## 2016-03-21 NOTE — Progress Notes (Signed)
Pt given 8 oz bottle of water to drink dos at 1030. Given verbal and written instructions ( teach back ) .  Pt voiced understanding and instructed no other liquid after midnight expect bottle of water she was given. May take med in with the water per instructions by call room nurse

## 2016-03-21 NOTE — Progress Notes (Signed)
Location of Breast Cancer: right breast cancer  Histology per Pathology Report:   02/29/16 Diagnosis Breast, right, needle core biopsy, 12:00 o'clock, 8 CMFN - INVASIVE DUCTAL CARCINOMA. - SEE MICROSCOPIC DESCRIPTION.  Receptor Status: ER(95%), PR (2%), Her2-neu (neg), Ki-(10%)  Did patient present with symptoms (if so, please note symptoms) or was this found on screening mammography?: screening mammogram  Past/Anticipated interventions by surgeon, if any: will have surgery tomorrow.  Past/Anticipated interventions by medical oncology, if any:  Antiestrogen therapy after radiation  Lymphedema issues, if any:  no    Pain issues, if any:  no   OB Gyn history: Patient is postmenopausal. menarche age 78, first live birth age 54. The patient went through menopause in her early 50s. She did not take hormone replacement. She did use oral contraceptives for about 10 years remotely, with no complications   SAFETY ISSUES:  Prior radiation? no  Pacemaker/ICD? no  Possible current pregnancy?no  Is the patient on methotrexate? no  Current Complaints / other details:  Patient is here with her sister.  BP (!) 145/81 (BP Location: Left Arm, Patient Position: Sitting)   Pulse 93   Temp 98.2 F (36.8 C) (Oral)   Ht '5\' 1"'  (1.549 m)   Wt 136 lb 8 oz (61.9 kg)   SpO2 100%   BMI 25.79 kg/m    Wt Readings from Last 3 Encounters:  03/23/16 136 lb 8 oz (61.9 kg)  03/17/16 135 lb 8 oz (61.5 kg)  10/03/13 140 lb (63.5 kg)      Jacqulyn Liner, RN 03/21/2016,9:42 AM

## 2016-03-22 ENCOUNTER — Ambulatory Visit
Admission: RE | Admit: 2016-03-22 | Discharge: 2016-03-22 | Disposition: A | Discharge status: Home / Self Care | Payer: 59 | Source: Ambulatory Visit | Attending: Surgery | Admitting: Surgery

## 2016-03-22 DIAGNOSIS — C50411 Malignant neoplasm of upper-outer quadrant of right female breast: Secondary | ICD-10-CM

## 2016-03-23 ENCOUNTER — Encounter: Payer: Self-pay | Admitting: Radiation Oncology

## 2016-03-23 ENCOUNTER — Ambulatory Visit
Admission: RE | Admit: 2016-03-23 | Discharge: 2016-03-23 | Disposition: A | Discharge status: Home / Self Care | Payer: 59 | Source: Ambulatory Visit | Attending: Radiation Oncology | Admitting: Radiation Oncology

## 2016-03-23 DIAGNOSIS — Z510 Encounter for antineoplastic radiation therapy: Secondary | ICD-10-CM | POA: Insufficient documentation

## 2016-03-23 DIAGNOSIS — C50411 Malignant neoplasm of upper-outer quadrant of right female breast: Secondary | ICD-10-CM | POA: Insufficient documentation

## 2016-03-23 DIAGNOSIS — Z170 Estrogen receptor positive status [ER+]: Secondary | ICD-10-CM | POA: Diagnosis not present

## 2016-03-23 DIAGNOSIS — E119 Type 2 diabetes mellitus without complications: Secondary | ICD-10-CM | POA: Insufficient documentation

## 2016-03-23 DIAGNOSIS — E785 Hyperlipidemia, unspecified: Secondary | ICD-10-CM | POA: Insufficient documentation

## 2016-03-23 DIAGNOSIS — K219 Gastro-esophageal reflux disease without esophagitis: Secondary | ICD-10-CM | POA: Diagnosis not present

## 2016-03-23 DIAGNOSIS — Z7984 Long term (current) use of oral hypoglycemic drugs: Secondary | ICD-10-CM | POA: Diagnosis not present

## 2016-03-23 DIAGNOSIS — I10 Essential (primary) hypertension: Secondary | ICD-10-CM | POA: Insufficient documentation

## 2016-03-23 DIAGNOSIS — Z79899 Other long term (current) drug therapy: Secondary | ICD-10-CM | POA: Insufficient documentation

## 2016-03-23 NOTE — Progress Notes (Signed)
Radiation Oncology         [phone] ________________________________  Initial Outpatient Consultation  Name: Meghan Padilla MRN: [id number]  Date: 03/23/2016  DOB: [birth year]  MG:[id number] Meghan Sers, MD  [name], MD   REFERRING PHYSICIAN: [name], MD  DIAGNOSIS: The encounter diagnosis was Breast cancer of upper-outer quadrant of right female breast ([place]).  HISTORY OF PRESENT ILLNESS::Meghan Padilla is a 65 y.o. female who had an abnormal screening mammogram in June 2017. She was sent to get a diagnostic mammogram and ultrasound 02/24/2016. This showed a site of distortion in the upper right breast. Ultrasound found a hypoechoic irregular mass with spiculated margins at the 12:00 position of the right breast measuring 0.5 cm and normal appearing lymph nodes in the right axilla. She had a biopsy 02/29/2016 that showed invasive ductal carcinoma, ER (95%), PR (2%), Her2-neu negative, Ki 67 (10%). She is here today to discuss radiation treatment options. She is accompanied by her sister.  PREVIOUS RADIATION THERAPY: No  PAST MEDICAL HISTORY:  has a past medical history of Cancer ([place]) (2017); Diabetes mellitus; GERD (gastroesophageal reflux disease); Hyperlipidemia; Hypertension; and UTI (urinary tract infection).    PAST SURGICAL HISTORY: Past Surgical History:  Procedure Laterality Date  . DILATION AND CURETTAGE OF UTERUS    . HAND SURGERY    . HAND SURGERY    . HYSTEROSCOPY W/D&C  11/28/2008  . TONSILLECTOMY      FAMILY HISTORY: No family history of breast or ovarian cancer  SOCIAL HISTORY:  reports that she has never smoked. She has never used smokeless tobacco. She reports that she does not drink alcohol or use drugs.  ALLERGIES: Sulfa antibiotics  MEDICATIONS:  Current Outpatient Prescriptions  Medication Sig Dispense Refill  . atorvastatin (LIPITOR) 40 MG tablet Take 40 mg by mouth daily.    . metFORMIN (GLUCOPHAGE) 500 MG tablet Take 1,500 mg by mouth  at bedtime.     Marland Kitchen omeprazole (PRILOSEC) 20 MG capsule Take 20 mg by mouth daily.    . quinapril-hydrochlorothiazide (ACCURETIC) 20-25 MG per tablet Take 1 tablet by mouth daily.    . sitaGLIPtin (JANUVIA) 100 MG tablet Take 100 mg by mouth daily.    . solifenacin (VESICARE) 10 MG tablet Take 10 mg by mouth daily.     No current facility-administered medications for this encounter.     REVIEW OF SYSTEMS:  A 15 point review of systems is documented in the electronic medical record. This was obtained by the nursing staff. However, I reviewed this with the patient to discuss relevant findings and make appropriate changes.  Pertinent items are noted in HPI.    PHYSICAL EXAM:  height is '5\' 1"'  (1.549 m) and weight is 136 lb 8 oz (61.9 kg). Her oral temperature is 98.2 F (36.8 C). Her blood pressure is 145/81 (abnormal) and her pulse is 93. Her oxygen saturation is 100%.    General: Alert and oriented, in no acute distress HEENT: Head is normocephalic. Extraocular movements are intact. Oropharynx is clear. Neck: Neck is supple, no palpable cervical or supraclavicular lymphadenopathy. Heart: Regular in rate and rhythm with no murmurs, rubs, or gallops. Chest: Clear to auscultation bilaterally, with no rhonchi, wheezes, or rales. Abdomen: Soft, nontender, nondistended, with no rigidity or guarding. Extremities: No cyanosis or edema. Lymphatics: see Neck Exam Skin: No concerning lesions. Musculoskeletal: symmetric strength and muscle tone throughout. Neurologic: Cranial nerves II through XII are grossly intact. No obvious focalities. Speech is fluent. Coordination is intact. Psychiatric:  Judgment and insight are intact. Affect is appropriate. Breast: Her right breast has a small biopsy site in the 12:00 position. She has clear plastic tape over the upper chest from her seed placement earlier today in preparation for her lumpectomy and sentinel node procedure. No palpable mass, nipple discharge, or  bleeding in either breast.  Menarche: age 64 Menopause: early 38's First live birth: age 71 Hormone replacement: No Birth Control: Oral contraceptives for about 10 years remotely, with no complications.   ECOG = 0  LABORATORY DATA:  Lab Results  Component Value Date   WBC 9.3 03/17/2016   HGB 12.5 03/17/2016   HCT 37.7 03/17/2016   MCV 83.2 03/17/2016   PLT 227 03/17/2016   NEUTROABS 5.6 03/17/2016   Lab Results  Component Value Date   NA 140 03/21/2016   K 3.8 03/21/2016   CL 103 03/21/2016   CO2 29 03/21/2016   GLUCOSE 125 (H) 03/21/2016   CREATININE 0.75 03/21/2016   CALCIUM 9.4 03/21/2016      RADIOGRAPHY: Mm Digital Diagnostic Unilat R  Result Date: 02/29/2016 CLINICAL DATA:  Status post ultrasound-guided right breast biopsy EXAM: DIAGNOSTIC RIGHT MAMMOGRAM POST ULTRASOUND BIOPSY COMPARISON:  Previous exam(s). FINDINGS: Mammographic images were obtained following ultrasound guided biopsy of a suspicious right breast mass at 12 o'clock, 8 cm from the nipple. Post biopsy mammogram demonstrates the ribbon shaped biopsy marker to be in the expected location within the superior right breast. IMPRESSION: Appropriate marker position as above. Final Assessment: Post Procedure Mammograms for Marker Placement Electronically Signed   By: Pamelia Hoit M.D.   On: 02/29/2016 13:40   US Breast Ltd Uni Right Inc Axilla  Result Date: 02/24/2016 CLINICAL DATA:  65 year old female presenting for screening recall of a possible right breast distortion and a right breast mass, possibly a lymph node. EXAM: 2D DIGITAL DIAGNOSTIC UNILATERAL RIGHT MAMMOGRAM WITH CAD AND ADJUNCT TOMO RIGHT BREAST ULTRASOUND COMPARISON:  Previous exam(s). ACR Breast Density Category c: The breast tissue is heterogeneously dense, which may obscure small masses. FINDINGS: In the superior aspect of the right breast, posterior depth there is a persistent site of distortion. The mass of concern seen on the MLO view is  consistent with a lymph node on the spot compression tomosynthesis image. Mammographic images were processed with CAD. No palpable mass can be identified in the superior aspect of the right breast. Ultrasound targeted to the right breast at 12 o'clock, 8 cm from the nipple demonstrates a hypoechoic irregular mass with spiculated margins measuring 5 x 5 x 5 mm. Blood flow is documented within the mass on color Doppler imaging. Ultrasound of the right axilla demonstrates multiple normal-appearing lymph nodes. IMPRESSION: 1. There is a suspicious mass in the right breast at 12 o'clock. 2. The mass of concern on the screening mammogram in the low right axilla corresponds with a benign-appearing lymph node. 3.  No evidence of right axillary lymphadenopathy. RECOMMENDATION: Ultrasound-guided biopsy is recommended for the right breast mass at 12 o'clock. This has been scheduled for 02/29/2016 at 12:30 p.m. I have discussed the findings and recommendations with the patient. Results were also provided in writing at the conclusion of the visit. If applicable, a reminder letter will be sent to the patient regarding the next appointment. BI-RADS CATEGORY  4: Suspicious. Electronically Signed   By: Ammie Ferrier M.D.   On: 02/24/2016 12:54   Mm Diag Breast Tomo Uni Right  Result Date: 02/24/2016 CLINICAL DATA:  65 year old female presenting for screening  recall of a possible right breast distortion and a right breast mass, possibly a lymph node. EXAM: 2D DIGITAL DIAGNOSTIC UNILATERAL RIGHT MAMMOGRAM WITH CAD AND ADJUNCT TOMO RIGHT BREAST ULTRASOUND COMPARISON:  Previous exam(s). ACR Breast Density Category c: The breast tissue is heterogeneously dense, which may obscure small masses. FINDINGS: In the superior aspect of the right breast, posterior depth there is a persistent site of distortion. The mass of concern seen on the MLO view is consistent with a lymph node on the spot compression tomosynthesis image. Mammographic  images were processed with CAD. No palpable mass can be identified in the superior aspect of the right breast. Ultrasound targeted to the right breast at 12 o'clock, 8 cm from the nipple demonstrates a hypoechoic irregular mass with spiculated margins measuring 5 x 5 x 5 mm. Blood flow is documented within the mass on color Doppler imaging. Ultrasound of the right axilla demonstrates multiple normal-appearing lymph nodes. IMPRESSION: 1. There is a suspicious mass in the right breast at 12 o'clock. 2. The mass of concern on the screening mammogram in the low right axilla corresponds with a benign-appearing lymph node. 3.  No evidence of right axillary lymphadenopathy. RECOMMENDATION: Ultrasound-guided biopsy is recommended for the right breast mass at 12 o'clock. This has been scheduled for 02/29/2016 at 12:30 p.m. I have discussed the findings and recommendations with the patient. Results were also provided in writing at the conclusion of the visit. If applicable, a reminder letter will be sent to the patient regarding the next appointment. BI-RADS CATEGORY  4: Suspicious. Electronically Signed   By: Ammie Ferrier M.D.   On: 02/24/2016 12:54   Mm Rt Radioactive Seed Loc Mammo Guide  Result Date: 03/22/2016 CLINICAL DATA:  65 year old female with recently diagnosed invasive right breast cancer. EXAM: MAMMOGRAPHIC GUIDED RADIOACTIVE SEED LOCALIZATION OF THE RIGHT BREAST COMPARISON:  Previous exam(s). FINDINGS: Patient presents for radioactive seed localization prior to right breast lumpectomy. I met with the patient and we discussed the procedure of seed localization including benefits and alternatives. We discussed the high likelihood of a successful procedure. We discussed the risks of the procedure including infection, bleeding, tissue injury and further surgery. We discussed the low dose of radioactivity involved in the procedure. Informed, written consent was given. The usual time-out protocol was  performed immediately prior to the procedure. Using mammographic guidance, sterile technique, 1% lidocaine and an I-125 radioactive seed, the mass with associated ribbon shaped biopsy marking clip was localized using a superior to inferior approach. The follow-up mammogram images confirm the seed in the expected location and were marked for Dr. Brantley Stage. Follow-up survey of the patient confirms presence of the radioactive seed. Order number of I-125 seed:  [id number]. Total activity:  0.253 mCi  Reference Date: 03/18/2016 The patient tolerated the procedure well and was released from the [place]. She was given instructions regarding seed removal. IMPRESSION: Radioactive seed localization right breast. No apparent complications. Electronically Signed   By: Everlean Alstrom M.D.   On: 03/22/2016 14:35   [place] Rt Breast Bx W Loc Dev 1st Lesion Img Bx Spec US Guide  Addendum Date: 03/02/2016   ADDENDUM REPORT: 03/01/2016 15:23 ADDENDUM: Pathology revealed GRADE II INVASIVE DUCTAL CARCINOMA of the Right breast at the 12:00 o'clock location. This was found to be concordant by Dr. Pamelia Hoit. Pathology results were discussed with the patient by telephone. The patient reported doing well after the biopsy with tenderness at the site. Post biopsy instructions and care were reviewed and  questions were answered. The patient was encouraged to call [facility] for any additional concerns. Surgical consultation has been arranged with Dr. Erroll Luna at [facility] on March 07, 2016. Pathology results reported by Terie Purser, RN on 03/01/2016. Electronically Signed   By: Pamelia Hoit M.D.   On: 03/01/2016 15:23  Result Date: 03/02/2016 CLINICAL DATA:  65 year old female for ultrasound-guided biopsy of an indeterminate right breast mass at 12 o'clock, 8 cm from the nipple EXAM: ULTRASOUND GUIDED RIGHT BREAST CORE NEEDLE BIOPSY COMPARISON:  Previous exam(s). FINDINGS: I met with the  patient and we discussed the procedure of ultrasound-guided biopsy, including benefits and alternatives. We discussed the high likelihood of a successful procedure. We discussed the risks of the procedure, including infection, bleeding, tissue injury, clip migration, and inadequate sampling. Informed written consent was given. The usual time-out protocol was performed immediately prior to the procedure. Using sterile technique and 1% Lidocaine as local anesthetic, under direct ultrasound visualization, a 12 gauge spring-loaded device was used to perform biopsy of a suspicious right breast mass at 12 o'clock, 8 cm from the nipple using a lateral to medial approach. At the conclusion of the procedure a ribbon shaped tissue marker clip was deployed into the biopsy cavity. Follow up 2 view mammogram was performed and dictated separately. IMPRESSION: Ultrasound guided biopsy of a suspicious right breast mass at 12 o'clock, 8 cm from the nipple. No apparent complications. Electronically Signed: By: Pamelia Hoit M.D. On: 02/29/2016 13:39    IMPRESSION: Meghan Padilla is a 64 yo woman with clinical stage IA, T1a N0 invasive ductal carcinoma in the right breast. She is a good candidate for breast conservation therapy.   PLAN: I spoke to the patient today regarding her diagnosis and options for treatment. We discussed the role of radiation in decreasing local failures in patients who undergo lumpectomy. We discussed 4-6 weeks of treatment as an outpatient. We discussed the possible side effects including but not limited to skin redness, fatigue, permanent skin darkening, and breast swelling. I will follow up with her in the post-operative setting to schedule her simulation appointment and we will start treatment 6 weeks after surgery unless adjuvant chemotherapy is recommended as part of her treatment.  She has a lumpectomy with sentinel lymph node biopsy tomorrow, 03/24/2016.        ------------------------------------------------  Blair Promise, PhD, MD    This document serves as a record of services personally performed by Gery Pray, MD. It was created on his behalf by Lendon Collar, a trained medical scribe. The creation of this record is based on the scribe's personal observations and the provider's statements to them. This document has been checked and approved by the attending provider.

## 2016-03-23 NOTE — Progress Notes (Signed)
Please see the Nurse Progress Note in the MD Initial Consult Encounter for this patient. 

## 2016-03-24 ENCOUNTER — Encounter (HOSPITAL_BASED_OUTPATIENT_CLINIC_OR_DEPARTMENT_OTHER): Payer: Self-pay | Admitting: Anesthesiology

## 2016-03-24 ENCOUNTER — Encounter (HOSPITAL_COMMUNITY): Payer: 59

## 2016-03-24 ENCOUNTER — Ambulatory Visit
Admission: RE | Admit: 2016-03-24 | Discharge: 2016-03-24 | Disposition: A | Discharge status: Home / Self Care | Payer: 59 | Source: Ambulatory Visit | Attending: Surgery | Admitting: Surgery

## 2016-03-24 ENCOUNTER — Ambulatory Visit (HOSPITAL_BASED_OUTPATIENT_CLINIC_OR_DEPARTMENT_OTHER)
Admission: RE | Admit: 2016-03-24 | Discharge: 2016-03-24 | Disposition: A | Discharge status: Home / Self Care | Payer: 59 | Source: Ambulatory Visit | Attending: Surgery | Admitting: Surgery

## 2016-03-24 ENCOUNTER — Encounter (HOSPITAL_BASED_OUTPATIENT_CLINIC_OR_DEPARTMENT_OTHER)
Admission: RE | Disposition: A | Discharge status: Home / Self Care | Payer: Self-pay | Source: Ambulatory Visit | Attending: Surgery

## 2016-03-24 ENCOUNTER — Ambulatory Visit (HOSPITAL_BASED_OUTPATIENT_CLINIC_OR_DEPARTMENT_OTHER): Payer: 59 | Admitting: Anesthesiology

## 2016-03-24 ENCOUNTER — Encounter (HOSPITAL_COMMUNITY)
Admission: RE | Admit: 2016-03-24 | Discharge: 2016-03-24 | Disposition: A | Discharge status: Home / Self Care | Payer: 59 | Source: Ambulatory Visit | Attending: Surgery | Admitting: Surgery

## 2016-03-24 DIAGNOSIS — D0501 Lobular carcinoma in situ of right breast: Secondary | ICD-10-CM | POA: Insufficient documentation

## 2016-03-24 DIAGNOSIS — I10 Essential (primary) hypertension: Secondary | ICD-10-CM | POA: Insufficient documentation

## 2016-03-24 DIAGNOSIS — C50411 Malignant neoplasm of upper-outer quadrant of right female breast: Secondary | ICD-10-CM | POA: Diagnosis not present

## 2016-03-24 DIAGNOSIS — E119 Type 2 diabetes mellitus without complications: Secondary | ICD-10-CM | POA: Insufficient documentation

## 2016-03-24 DIAGNOSIS — Z170 Estrogen receptor positive status [ER+]: Secondary | ICD-10-CM | POA: Insufficient documentation

## 2016-03-24 DIAGNOSIS — E7800 Pure hypercholesterolemia, unspecified: Secondary | ICD-10-CM | POA: Insufficient documentation

## 2016-03-24 DIAGNOSIS — Z79899 Other long term (current) drug therapy: Secondary | ICD-10-CM | POA: Insufficient documentation

## 2016-03-24 DIAGNOSIS — K219 Gastro-esophageal reflux disease without esophagitis: Secondary | ICD-10-CM | POA: Insufficient documentation

## 2016-03-24 DIAGNOSIS — Z7984 Long term (current) use of oral hypoglycemic drugs: Secondary | ICD-10-CM | POA: Insufficient documentation

## 2016-03-24 HISTORY — DX: Malignant (primary) neoplasm, unspecified: C80.1

## 2016-03-24 HISTORY — PX: BREAST LUMPECTOMY: SHX2

## 2016-03-24 HISTORY — PX: RADIOACTIVE SEED GUIDED PARTIAL MASTECTOMY WITH AXILLARY SENTINEL LYMPH NODE BIOPSY: SHX6520

## 2016-03-24 HISTORY — DX: Gastro-esophageal reflux disease without esophagitis: K21.9

## 2016-03-24 LAB — GLUCOSE, CAPILLARY
GLUCOSE-CAPILLARY: 123 mg/dL — AB (ref 65–99)
GLUCOSE-CAPILLARY: 120 mg/dL — AB (ref 65–99)

## 2016-03-24 SURGERY — RADIOACTIVE SEED GUIDED PARTIAL MASTECTOMY WITH AXILLARY SENTINEL LYMPH NODE BIOPSY
Anesthesia: General | Site: Breast | Laterality: Right

## 2016-03-24 MED ORDER — ONDANSETRON 4 MG PO TBDP
4.0000 mg | ORAL_TABLET | Freq: Once | ORAL | Status: AC
  Administered 2016-03-24: 4 mg via ORAL

## 2016-03-24 MED ORDER — LACTATED RINGERS IV SOLN
INTRAVENOUS | Status: DC
Start: 2016-03-24 — End: 2016-03-24
  Administered 2016-03-24 (×2): via INTRAVENOUS

## 2016-03-24 MED ORDER — FENTANYL CITRATE (PF) 100 MCG/2ML IJ SOLN
25.0000 ug | INTRAMUSCULAR | Status: DC | PRN
  Administered 2016-03-24 (×2): 50 ug via INTRAVENOUS

## 2016-03-24 MED ORDER — SCOPOLAMINE 1 MG/3DAYS TD PT72: 1.0000 | MEDICATED_PATCH | Freq: Once | TRANSDERMAL | Status: DC | PRN

## 2016-03-24 MED ORDER — TECHNETIUM TC 99M SULFUR COLLOID FILTERED
1.0000 | Freq: Once | INTRAVENOUS | Status: AC | PRN
  Administered 2016-03-24: 1 via INTRADERMAL

## 2016-03-24 MED ORDER — FENTANYL CITRATE (PF) 100 MCG/2ML IJ SOLN
INTRAMUSCULAR | Status: AC
  Filled 2016-03-24: qty 2

## 2016-03-24 MED ORDER — CEFAZOLIN SODIUM-DEXTROSE 2-4 GM/100ML-% IV SOLN
INTRAVENOUS | Status: AC
  Filled 2016-03-24: qty 100

## 2016-03-24 MED ORDER — MIDAZOLAM HCL 2 MG/2ML IJ SOLN
1.0000 mg | INTRAMUSCULAR | Status: DC | PRN
  Administered 2016-03-24: 1 mg via INTRAVENOUS

## 2016-03-24 MED ORDER — MIDAZOLAM HCL 2 MG/2ML IJ SOLN
INTRAMUSCULAR | Status: AC
  Filled 2016-03-24: qty 2

## 2016-03-24 MED ORDER — BUPIVACAINE-EPINEPHRINE (PF) 0.25% -1:200000 IJ SOLN
INTRAMUSCULAR | Status: DC | PRN
  Administered 2016-03-24: 14 mL

## 2016-03-24 MED ORDER — CHLORHEXIDINE GLUCONATE CLOTH 2 % EX PADS: 6.0000 | MEDICATED_PAD | Freq: Once | CUTANEOUS | Status: DC

## 2016-03-24 MED ORDER — FENTANYL CITRATE (PF) 100 MCG/2ML IJ SOLN
50.0000 ug | INTRAMUSCULAR | Status: AC | PRN
  Administered 2016-03-24 (×4): 50 ug via INTRAVENOUS

## 2016-03-24 MED ORDER — ACETAMINOPHEN 500 MG PO TABS
ORAL_TABLET | ORAL | Status: AC
  Filled 2016-03-24: qty 2

## 2016-03-24 MED ORDER — ONDANSETRON HCL 4 MG/2ML IJ SOLN: 4.0000 mg | Freq: Once | INTRAMUSCULAR | Status: DC | PRN

## 2016-03-24 MED ORDER — BUPIVACAINE-EPINEPHRINE (PF) 0.5% -1:200000 IJ SOLN
INTRAMUSCULAR | Status: DC | PRN
  Administered 2016-03-24: 30 mL

## 2016-03-24 MED ORDER — DEXTROSE 5 % IV SOLN
3.0000 g | INTRAVENOUS | Status: AC
  Administered 2016-03-24: 2 g via INTRAVENOUS

## 2016-03-24 MED ORDER — PROPOFOL 10 MG/ML IV BOLUS
INTRAVENOUS | Status: DC | PRN
  Administered 2016-03-24: 200 mg via INTRAVENOUS

## 2016-03-24 MED ORDER — GABAPENTIN 300 MG PO CAPS
300.0000 mg | ORAL_CAPSULE | ORAL | Status: AC
  Administered 2016-03-24: 300 mg via ORAL

## 2016-03-24 MED ORDER — ONDANSETRON HCL 4 MG/2ML IJ SOLN
INTRAMUSCULAR | Status: AC
  Filled 2016-03-24: qty 2

## 2016-03-24 MED ORDER — ONDANSETRON HCL 4 MG/2ML IJ SOLN
INTRAMUSCULAR | Status: DC | PRN
  Administered 2016-03-24: 4 mg via INTRAVENOUS

## 2016-03-24 MED ORDER — ONDANSETRON 4 MG PO TBDP
ORAL_TABLET | ORAL | Status: AC
  Filled 2016-03-24: qty 1

## 2016-03-24 MED ORDER — PROPOFOL 10 MG/ML IV BOLUS
INTRAVENOUS | Status: AC
  Filled 2016-03-24: qty 20

## 2016-03-24 MED ORDER — OXYCODONE-ACETAMINOPHEN 5-325 MG PO TABS: 1.0000 | ORAL_TABLET | ORAL | 0 refills | Status: DC | PRN

## 2016-03-24 MED ORDER — GLYCOPYRROLATE 0.2 MG/ML IJ SOLN: 0.2000 mg | Freq: Once | INTRAMUSCULAR | Status: DC | PRN

## 2016-03-24 MED ORDER — DEXAMETHASONE SODIUM PHOSPHATE 4 MG/ML IJ SOLN
INTRAMUSCULAR | Status: DC | PRN
  Administered 2016-03-24: 5 mg via INTRAVENOUS

## 2016-03-24 MED ORDER — DEXAMETHASONE SODIUM PHOSPHATE 10 MG/ML IJ SOLN
INTRAMUSCULAR | Status: AC
  Filled 2016-03-24: qty 1

## 2016-03-24 MED ORDER — ACETAMINOPHEN 500 MG PO TABS
1000.0000 mg | ORAL_TABLET | ORAL | Status: AC
  Administered 2016-03-24: 1000 mg via ORAL

## 2016-03-24 MED ORDER — GABAPENTIN 300 MG PO CAPS
ORAL_CAPSULE | ORAL | Status: AC
  Filled 2016-03-24: qty 1

## 2016-03-24 MED ORDER — LIDOCAINE 2% (20 MG/ML) 5 ML SYRINGE
INTRAMUSCULAR | Status: DC | PRN
  Administered 2016-03-24: 60 mg via INTRAVENOUS

## 2016-03-24 SURGICAL SUPPLY — 45 items
APPLIER CLIP 9.375 MED OPEN (MISCELLANEOUS) ×2
BINDER BREAST LRG (GAUZE/BANDAGES/DRESSINGS) IMPLANT
BINDER BREAST MEDIUM (GAUZE/BANDAGES/DRESSINGS) IMPLANT
BLADE SURG 15 STRL LF DISP TIS (BLADE) ×1 IMPLANT
BLADE SURG 15 STRL SS (BLADE) ×1
CANISTER SUC SOCK COL 7IN (MISCELLANEOUS) IMPLANT
CANISTER SUCT 1200ML W/VALVE (MISCELLANEOUS) IMPLANT
CHLORAPREP W/TINT 26ML (MISCELLANEOUS) ×2 IMPLANT
CLIP APPLIE 9.375 MED OPEN (MISCELLANEOUS) ×1 IMPLANT
COVER BACK TABLE 60X90IN (DRAPES) ×2 IMPLANT
COVER MAYO STAND STRL (DRAPES) ×2 IMPLANT
COVER PROBE W GEL 5X96 (DRAPES) ×2 IMPLANT
DECANTER SPIKE VIAL GLASS SM (MISCELLANEOUS) IMPLANT
DEVICE DUBIN W/COMP PLATE 8390 (MISCELLANEOUS) ×2 IMPLANT
DRAPE LAPAROSCOPIC ABDOMINAL (DRAPES) ×2 IMPLANT
DRAPE UTILITY XL STRL (DRAPES) ×2 IMPLANT
ELECT COATED BLADE 2.86 ST (ELECTRODE) ×2 IMPLANT
ELECT REM PT RETURN 9FT ADLT (ELECTROSURGICAL) ×2
ELECTRODE REM PT RTRN 9FT ADLT (ELECTROSURGICAL) ×1 IMPLANT
GLOVE BIOGEL PI IND STRL 7.0 (GLOVE) ×2 IMPLANT
GLOVE BIOGEL PI IND STRL 8 (GLOVE) ×1 IMPLANT
GLOVE BIOGEL PI INDICATOR 7.0 (GLOVE) ×2
GLOVE BIOGEL PI INDICATOR 8 (GLOVE) ×1
GLOVE ECLIPSE 6.5 STRL STRAW (GLOVE) ×2 IMPLANT
GLOVE ECLIPSE 8.0 STRL XLNG CF (GLOVE) ×2 IMPLANT
GOWN STRL REUS W/ TWL LRG LVL3 (GOWN DISPOSABLE) ×2 IMPLANT
GOWN STRL REUS W/TWL LRG LVL3 (GOWN DISPOSABLE) ×2
HEMOSTAT SNOW SURGICEL 2X4 (HEMOSTASIS) IMPLANT
KIT MARKER MARGIN INK (KITS) ×2 IMPLANT
LIQUID BAND (GAUZE/BANDAGES/DRESSINGS) ×2 IMPLANT
NDL SAFETY ECLIPSE 18X1.5 (NEEDLE) IMPLANT
NEEDLE HYPO 18GX1.5 SHARP (NEEDLE)
NEEDLE HYPO 25X1 1.5 SAFETY (NEEDLE) ×2 IMPLANT
NS IRRIG 1000ML POUR BTL (IV SOLUTION) ×2 IMPLANT
PACK BASIN DAY SURGERY FS (CUSTOM PROCEDURE TRAY) ×2 IMPLANT
PENCIL BUTTON HOLSTER BLD 10FT (ELECTRODE) ×2 IMPLANT
SLEEVE SCD COMPRESS KNEE MED (MISCELLANEOUS) ×2 IMPLANT
SPONGE LAP 4X18 X RAY DECT (DISPOSABLE) ×2 IMPLANT
SUT MNCRL AB 4-0 PS2 18 (SUTURE) ×2 IMPLANT
SUT VICRYL 3-0 CR8 SH (SUTURE) ×2 IMPLANT
SYR CONTROL 10ML LL (SYRINGE) ×2 IMPLANT
TOWEL OR 17X24 6PK STRL BLUE (TOWEL DISPOSABLE) ×2 IMPLANT
TOWEL OR NON WOVEN STRL DISP B (DISPOSABLE) ×2 IMPLANT
TUBE CONNECTING 20X1/4 (TUBING) IMPLANT
YANKAUER SUCT BULB TIP NO VENT (SUCTIONS) IMPLANT

## 2016-03-24 NOTE — Anesthesia Procedure Notes (Signed)
Procedure Name: LMA Insertion Date/Time: 03/24/2016 1:15 PM Performed by: Maryella Shivers Pre-anesthesia Checklist: Patient identified, Emergency Drugs available, Suction available and Patient being monitored Patient Re-evaluated:Patient Re-evaluated prior to inductionOxygen Delivery Method: Circle system utilized Preoxygenation: Pre-oxygenation with 100% oxygen Intubation Type: IV induction Ventilation: Mask ventilation without difficulty LMA: LMA inserted LMA Size: 4.0 Number of attempts: 1 Airway Equipment and Method: Bite block Placement Confirmation: positive ETCO2 Tube secured with: Tape Dental Injury: Teeth and Oropharynx as per pre-operative assessment

## 2016-03-24 NOTE — Anesthesia Procedure Notes (Signed)
Anesthesia Regional Block:  Pectoralis block  Pre-Anesthetic Checklist: ,, timeout performed, Correct Patient, Correct Site, Correct Laterality, Correct Procedure, Correct Position, site marked, Risks and benefits discussed,  Surgical consent,  Pre-op evaluation,  At surgeon's request and post-op pain management  Laterality: Right  Prep: Maximum Sterile Barrier Precautions used, chloraprep       Needles:  Injection technique: Single-shot  Needle Type: Echogenic Stimulator Needle     Needle Length: 10cm 10 cm Needle Gauge: 21 G    Additional Needles:  Procedures: ultrasound guided (picture in chart) and nerve stimulator Pectoralis block Narrative:  Injection made incrementally with aspirations every 5 mL.  Performed by: Personally  Anesthesiologist: Myley Bahner, [facility]ney  Additional Notes: Patient tolerated the procedure well without complications

## 2016-03-24 NOTE — Progress Notes (Signed)
Assisted Dr. Lauretta Grill with right, ultrasound guided, pectoralis block and nuc med tech [id number] with nuc med inj . Side rails up, monitors on throughout procedure. See vital signs in flow sheet. Tolerated Procedure well.

## 2016-03-24 NOTE — Anesthesia Preprocedure Evaluation (Signed)
Anesthesia Evaluation  Patient identified by MRN, date of birth, ID band Patient awake    Reviewed: Allergy & Precautions, NPO status , Patient's Chart, lab work & pertinent test results  History of Anesthesia Complications Negative for: history of anesthetic complications  Airway Mallampati: II  TM Distance: >3 FB Neck ROM: Full    Dental no notable dental hx. (+) Dental Advisory Given   Pulmonary neg pulmonary ROS,    Pulmonary exam normal breath sounds clear to auscultation       Cardiovascular hypertension, Normal cardiovascular exam Rhythm:Regular Rate:Normal     Neuro/Psych negative neurological ROS  negative psych ROS   GI/Hepatic Neg liver ROS, GERD  ,  Endo/Other  diabetes  Renal/GU negative Renal ROS  negative genitourinary   Musculoskeletal negative musculoskeletal ROS (+)   Abdominal   Peds negative pediatric ROS (+)  Hematology negative hematology ROS (+)   Anesthesia Other Findings   Reproductive/Obstetrics negative OB ROS                             Anesthesia Physical Anesthesia Plan  ASA: II  Anesthesia Plan: General   Post-op Pain Management: GA combined w/ Regional for post-op pain   Induction: Intravenous  Airway Management Planned: LMA  Additional Equipment:   Intra-op Plan:   Post-operative Plan: Extubation in OR  Informed Consent: I have reviewed the patients History and Physical, chart, labs and discussed the procedure including the risks, benefits and alternatives for the proposed anesthesia with the patient or authorized representative who has indicated his/her understanding and acceptance.   Dental advisory given  Plan Discussed with: CRNA  Anesthesia Plan Comments:         Anesthesia Quick Evaluation

## 2016-03-24 NOTE — Anesthesia Postprocedure Evaluation (Signed)
Anesthesia Post Note  Patient: Meghan Padilla  Procedure(s) Performed: Procedure(s) (LRB): RADIOACTIVE SEED GUIDED PARTIAL MASTECTOMY WITH AXILLARY SENTINEL LYMPH NODE BIOPSY (Right)  Patient location during evaluation: PACU Anesthesia Type: General Level of consciousness: awake and alert Pain management: pain level controlled Vital Signs Assessment: post-procedure vital signs reviewed and stable Respiratory status: spontaneous breathing, nonlabored ventilation, respiratory function stable and patient connected to nasal cannula oxygen Cardiovascular status: blood pressure returned to baseline and stable Postop Assessment: no signs of nausea or vomiting Anesthetic complications: no    Last Vitals:  Vitals:   03/24/16 1430 03/24/16 1542  BP: 126/73 135/74  Pulse: 89 80  Resp: (!) 22 18  Temp:  36.6 C    Last Pain:  Vitals:   03/24/16 1542  TempSrc:   PainSc: 4                  Chaniya Genter JENNETTE

## 2016-03-24 NOTE — Interval H&P Note (Signed)
History and Physical Interval Note:  03/24/2016 12:54 PM  Meghan Padilla  has presented today for surgery, with the diagnosis of RIGHT BREAST CANCER  The various methods of treatment have been discussed with the patient and family. After consideration of risks, benefits and other options for treatment, the patient has consented to  Procedure(s): RADIOACTIVE SEED GUIDED PARTIAL MASTECTOMY WITH AXILLARY SENTINEL LYMPH NODE BIOPSY (Right) as a surgical intervention .  The patient's history has been reviewed, patient examined, no change in status, stable for surgery.  I have reviewed the patient's chart and labs.  Questions were answered to the patient's satisfaction.     [name]

## 2016-03-24 NOTE — H&P (View-Only) (Signed)
Meghan Padilla 03/07/2016 9:02 AM Location: [facility] Patient #: [id number] DOB: 08/06/1951 Married / Language: Meghan Padilla / Race: White Female  History of Present Illness Marcello Moores A. Cornett Padilla; 03/07/2016 12:23 PM) Patient words: Patient sent request of Dr. Marvene Staff for an abnormal mammogram. Patient underwent screening mammogram showed a 5 mm area of distortion in the right breast upper outer quadrant. Core biopsy showed invasive ductal carcinoma ER positive. Positive with a proliferation rate of 10%. Patient denies history of breast pain, breast mass or nipple discharge bilaterally. No family history of breast or ovarian cancer.               ADDITIONAL INFORMATION: PROGNOSTIC INDICATORS Results: IMMUNOHISTOCHEMICAL AND MORPHOMETRIC ANALYSIS PERFORMED MANUALLY Estrogen Receptor: 95%, POSITIVE, STRONG STAINING INTENSITY Progesterone Receptor: 2%, POSITIVE, STRONG STAINING INTENSITY Proliferation Marker Ki67: 10% REFERENCE RANGE ESTROGEN RECEPTOR NEGATIVE 0% POSITIVE =>1% REFERENCE RANGE PROGESTERONE RECEPTOR NEGATIVE 0% POSITIVE =>1% All controls stained appropriately Meghan Padilla Pathologist, Electronic Signature ( Signed 03/03/2016) FINAL DIAGNOSIS Diagnosis Breast, right, needle core biopsy, 12:00 o'clock, 8 CMFN - INVASIVE DUCTAL CARCINOMA. - SEE MICROSCOPIC DESCRIPTION. Microscopic Comment The findings are consistent with grade 2 invasive ductal carcinoma. A breast prognostic profile will be performed. 1 of 2 FINAL for ULDINE, FUSTER ([id number]) Microscopic Comment(continued) Dr Tresa Moore agrees. Called to [facility] on 03/01/2016. (JDP:ecj 03/01/2016) Meghan Padilla Pathologist, Electronic Signature (Case signed 03/01/2016) Specimen Gross and Clinical Information Specimen Comment In formalin 13:00, extracted <1 min; irregular right breast mass Specimen(s) Obtained: Breast, right, needle core biopsy, 12:00 o'clock,  8 CMFN Specimen Clinical Information Suspicious for carcinoma Gross Received in formalin are 3 cores of soft, tan-red tissue which measure 1.0 x 0.2 x 0.2 cm and up to 1.5 x 0.2 x 0.2 cm. Submitted in toto in 1 block(s). Time in Formalin 01        CLINICAL DATA: 65 year old female presenting for screening recall of a possible right breast distortion and a right breast mass, possibly a lymph node.  EXAM: 2D DIGITAL DIAGNOSTIC UNILATERAL RIGHT MAMMOGRAM WITH CAD AND ADJUNCT TOMO  RIGHT BREAST ULTRASOUND  COMPARISON: Previous exam(s).  ACR Breast Density Category c: The breast tissue is heterogeneously dense, which may obscure small masses.  FINDINGS: In the superior aspect of the right breast, posterior depth there is a persistent site of distortion. The mass of concern seen on the MLO view is consistent with a lymph node on the spot compression tomosynthesis image.  Mammographic images were processed with CAD.  No palpable mass can be identified in the superior aspect of the right breast.  Ultrasound targeted to the right breast at 12 o'clock, 8 cm from the nipple demonstrates a hypoechoic irregular mass with spiculated margins measuring 5 x 5 x 5 mm. Blood flow is documented within the mass on color Doppler imaging. Ultrasound of the right axilla demonstrates multiple normal-appearing lymph nodes.  IMPRESSION: 1. There is a suspicious mass in the right breast at 12 o'clock.  2. The mass of concern on the screening mammogram in the low right axilla corresponds with a benign-appearing lymph node.  3. No evidence of right axillary lymphadenopathy.  RECOMMENDATION: Ultrasound-guided biopsy is recommended for the right breast mass at 12 o'clock. This has been scheduled for 02/29/2016 at 12:30 p.m.  I have discussed the findings and recommendations with the patient. Results were also provided in writing at the conclusion of the visit. If applicable, a reminder  letter will be sent to the  patient regarding the next appointment.  BI-RADS CATEGORY 4: Suspicious.   Electronically Signed By: Meghan Ferrier M.D. On: 02/24/2016 12:54.  The patient is a 65 year old female.   Other Problems Meghan Padilla, CMA; 03/07/2016 9:02 AM) Breast Cancer Diabetes Mellitus High blood pressure Hypercholesterolemia  Past Surgical History Meghan Padilla, CMA; 03/07/2016 9:02 AM) Breast Biopsy Right. Colon Polyp Removal - Colonoscopy  Allergies Meghan Padilla, CMA; 03/07/2016 9:04 AM) Sulfa 10 *OPHTHALMIC AGENTS* Nausea, Vomiting.  Medication History Meghan Padilla, CMA; 03/07/2016 9:03 AM) MetFORMIN HCl ER (500MG Tablet ER 24HR, Oral three times daily) Active. Atorvastatin Calcium (40MG Tablet, Oral) Active. Omeprazole (20MG Capsule DR, Oral) Active. Quinapril-Hydrochlorothiazide (20-25MG Tablet, Oral) Active. VESIcare (5MG Tablet, Oral two times daily) Active. Januvia (100MG Tablet, Oral) Active. Medications Reconciled  Social History Meghan Padilla, CMA; 03/07/2016 9:02 AM) Caffeine use Coffee, Tea. No alcohol use No drug use  Pregnancy / Birth History Meghan Padilla, CMA; 03/07/2016 9:02 AM) Age at menarche 10 years. Age of menopause 5-55 Gravida 2 Irregular periods Length (months) of breastfeeding 3-6 Maternal age 3-30 Para 2     Review of Systems Meghan Padilla CMA; 03/07/2016 9:02 AM) General Not Present- Appetite Loss, Chills, Fatigue, Fever, Night Sweats, Weight Gain and Weight Loss. Skin Not Present- Change in Wart/Mole, Dryness, Hives, Jaundice, New Lesions, Non-Healing Wounds, Rash and Ulcer. HEENT Present- Wears glasses/contact lenses. Not Present- Earache, Hearing Loss, Hoarseness, Nose Bleed, Oral Ulcers, Ringing in the Ears, Seasonal Allergies, Sinus Pain, Sore Throat, Visual Disturbances and Yellow Eyes. Female Genitourinary Present- Frequency and Urgency. Not Present- Nocturia, Painful Urination and Pelvic  Pain. Musculoskeletal Not Present- Back Pain, Joint Pain, Joint Stiffness, Muscle Pain, Muscle Weakness and Swelling of Extremities. Neurological Not Present- Decreased Memory, Fainting, Headaches, Numbness, Seizures, Tingling, Tremor, Trouble walking and Weakness. Psychiatric Not Present- Anxiety, Bipolar, Change in Sleep Pattern, Depression, Fearful and Frequent crying. Hematology Not Present- Blood Thinners, Easy Bruising, Excessive bleeding, Gland problems, HIV and Persistent Infections.  Vitals Meghan Padilla CMA; 03/07/2016 9:04 AM) 03/07/2016 9:03 AM Weight: 136 lb Height: 61in Body Surface Area: 1.6 m Body Mass Index: 25.7 kg/m  Temp.: 73F(Temporal)  Pulse: 100 (Regular)  BP: 124/68 (Sitting, Left Arm, Standard)      Physical Exam (Thomas A. Cornett Padilla; 03/07/2016 12:23 PM)  General Mental Status-Alert. General Appearance-Consistent with stated age. Hydration-Well hydrated. Voice-Normal.  Head and Neck Head-normocephalic, atraumatic with no lesions or palpable masses. Trachea-midline. Thyroid Gland Characteristics - normal size and consistency.  Chest and Lung Exam Chest and lung exam reveals -quiet, even and easy respiratory effort with no use of accessory muscles and on auscultation, normal breath sounds, no adventitious sounds and normal vocal resonance. Inspection Chest Wall - Normal. Back - normal.  Breast Breast - Left-Symmetric, Non Tender, No Biopsy scars, no Dimpling, No Inflammation, No Lumpectomy scars, No Mastectomy scars, No Peau d' Orange. Breast - Right-Symmetric, Non Tender, No Biopsy scars, no Dimpling, No Inflammation, No Lumpectomy scars, No Mastectomy scars, No Peau d' Orange. Breast Lump-No Palpable Breast Mass. Note: Small hematoma at biopsy site.  Cardiovascular Cardiovascular examination reveals -normal heart sounds, regular rate and rhythm with no murmurs and normal pedal pulses  bilaterally.  Neurologic Neurologic evaluation reveals -alert and oriented x 3 with no impairment of recent or remote memory. Mental Status-Normal.  Musculoskeletal Normal Exam - Left-Upper Extremity Strength Normal and Lower Extremity Strength Normal. Normal Exam - Right-Upper Extremity Strength Normal and Lower Extremity Strength Normal.  Lymphatic Head & Neck  General Head & Neck  Lymphatics: Bilateral - Description - Normal. Axillary  General Axillary Region: Bilateral - Description - Normal. Tenderness - Non Tender.    Assessment & Plan (Mayco Walrond A. Teigen Parslow Padilla; 03/07/2016 12:24 PM)  BREAST CANCER, RIGHT (C50.911) Impression: Discussed breast conservation versus mastectomy with reconstruction. Risks and benefits as well as long-term expectations and additional treatments of each discussed. Patient is opted for right breast seed localized partial mastectomy and right axillary sentinel lymph node mapping. Discussed risk of bleeding, infection, seroma formation, cosmetic deformity, some lipedema, numbness, stiffness, and other potential treatments and/or complications of surgery and treatments after that fact. Risk of lumpectomy include bleeding, infection, seroma, more surgery, use of seed/wire, wound care, cosmetic deformity and the need for other treatments, death , blood clots, death. Pt agrees to proceed. Risk of sentinel lymph node mapping include bleeding, infection, lymphedema, shoulder pain. stiffness, dye allergy. cosmetic deformity , blood clots, death, need for more surgery. Pt agres to proceed.  Current Plans Referred to Genetic Counseling, for evaluation and follow up (Medical Genetics). Routine. Referred to Surgery - Plastic, for evaluation and follow up (Plastic Surgery). Routine. Referred to Oncology, for evaluation and follow up (Oncology). Routine. Referred to Radiation Oncology, for evaluation and follow up (Radiation Oncology). Routine. Referred to Physical  Therapy, for evaluation and follow up (Physical Therapy). Routine. We discussed the staging and pathophysiology of breast cancer. We discussed all of the different options for treatment for breast cancer including surgery, chemotherapy, radiation therapy, Herceptin, and antiestrogen therapy. We discussed a sentinel lymph node biopsy as she does not appear to having lymph node involvement right now. We discussed the performance of that with injection of radioactive tracer and blue dye. We discussed that she would have an incision underneath her axillary hairline. We discussed that there is a bout a 10-20% chance of having a positive node with a sentinel lymph node biopsy and we will await the permanent pathology to make any other first further decisions in terms of her treatment. One of these options might be to return to the operating room to perform an axillary lymph node dissection. We discussed about a 1-2% risk lifetime of chronic shoulder pain as well as lymphedema associated with a sentinel lymph node biopsy. We discussed the options for treatment of the breast cancer which included lumpectomy versus a mastectomy. We discussed the performance of the lumpectomy with a wire placement. We discussed a 10-20% chance of a positive margin requiring reexcision in the operating room. We also discussed that she may need radiation therapy or antiestrogen therapy or both if she undergoes lumpectomy. We discussed the mastectomy and the postoperative care for that as well. We discussed that there is no difference in her survival whether she undergoes lumpectomy with radiation therapy or antiestrogen therapy versus a mastectomy. There is a slight difference in the local recurrence rate being 3-5% with lumpectomy and about 1% with a mastectomy. We discussed the risks of operation including bleeding, infection, possible reoperation. She understands her further therapy will be based on what her stages at the time of her  operation.  Pt Education - [facility] breast cancer surgery: discussed with patient and provided information. Pt Education - [facility] Breast Biopsy HCI: discussed with patient and provided information. Pt Education - ABC (After Breast Cancer) Class Info: discussed with patient and provided information.

## 2016-03-24 NOTE — Transfer of Care (Signed)
Immediate Anesthesia Transfer of Care Note  Patient: Meghan Padilla  Procedure(s) Performed: Procedure(s): RADIOACTIVE SEED GUIDED PARTIAL MASTECTOMY WITH AXILLARY SENTINEL LYMPH NODE BIOPSY (Right)  Patient Location: PACU  Anesthesia Type:GA combined with regional for post-op pain  Level of Consciousness: sedated  Airway & Oxygen Therapy: Patient Spontanous Breathing and Patient connected to face mask oxygen  Post-op Assessment: Report given to RN and Post -op Vital signs reviewed and stable  Post vital signs: Reviewed and stable  Last Vitals:  Vitals:   03/24/16 1411 03/24/16 1413  BP: 109/60   Pulse:  82  Resp:  10  Temp:      Last Pain:  Vitals:   03/24/16 1059  TempSrc: Oral         Complications: No apparent anesthesia complications

## 2016-03-24 NOTE — Discharge Instructions (Signed)
[facility] °Office Phone Number [phone] ° °BREAST BIOPSY/ PARTIAL MASTECTOMY: POST OP INSTRUCTIONS ° °Always review your discharge instruction sheet given to you by the facility where your surgery was performed. ° °IF YOU HAVE DISABILITY OR FAMILY LEAVE FORMS, YOU MUST BRING THEM TO THE OFFICE FOR PROCESSING.  DO NOT GIVE THEM TO YOUR DOCTOR. ° °1. A prescription for pain medication may be given to you upon discharge.  Take your pain medication as prescribed, if needed.  If narcotic pain medicine is not needed, then you may take acetaminophen (Tylenol) or ibuprofen (Advil) as needed. °2. Take your usually prescribed medications unless otherwise directed °3. If you need a refill on your pain medication, please contact your pharmacy.  They will contact our office to request authorization.  Prescriptions will not be filled after 5pm or on week-ends. °4. You should eat very light the first 24 hours after surgery, such as soup, crackers, pudding, etc.  Resume your normal diet the day after surgery. °5. Most patients will experience some swelling and bruising in the breast.  Ice packs and a good support bra will help.  Swelling and bruising can take several days to resolve.  °6. It is common to experience some constipation if taking pain medication after surgery.  Increasing fluid intake and taking a stool softener will usually help or prevent this problem from occurring.  A mild laxative (Milk of Magnesia or Miralax) should be taken according to package directions if there are no bowel movements after 48 hours. °7. Unless discharge instructions indicate otherwise, you may remove your bandages 24-48 hours after surgery, and you may shower at that time.  You may have steri-strips (small skin tapes) in place directly over the incision.  These strips should be left on the skin for 7-10 days.  If your surgeon used skin glue on the incision, you may shower in 24 hours.  The glue will flake off over the  next 2-3 weeks.  Any sutures or staples will be removed at the office during your follow-up visit. °8. ACTIVITIES:  You may resume regular daily activities (gradually increasing) beginning the next day.  Wearing a good support bra or sports bra minimizes pain and swelling.  You may have sexual intercourse when it is comfortable. °a. You may drive when you no longer are taking prescription pain medication, you can comfortably wear a seatbelt, and you can safely maneuver your car and apply brakes. °b. RETURN TO WORK:  ______________________________________________________________________________________ °9. You should see your doctor in the office for a follow-up appointment approximately two weeks after your surgery.  Your doctor’s nurse will typically make your follow-up appointment when she calls you with your pathology report.  Expect your pathology report 2-3 business days after your surgery.  You may call to check if you do not hear from us after three days. °10. OTHER INSTRUCTIONS: _______________________________________________________________________________________________ _____________________________________________________________________________________________________________________________________ °_____________________________________________________________________________________________________________________________________ °_____________________________________________________________________________________________________________________________________ ° °WHEN TO CALL YOUR DOCTOR: °1. Fever over 101.0 °2. Nausea and/or vomiting. °3. Extreme swelling or bruising. °4. Continued bleeding from incision. °5. Increased pain, redness, or drainage from the incision. ° °The clinic staff is available to answer your questions during regular business hours.  Please don’t hesitate to call and ask to speak to one of the nurses for clinical concerns.  If you have a medical emergency, go to the nearest  emergency room or call 911.  A surgeon from [facility] is always on call at the hospital. ° °For further questions, please visit [email or web address]  ° ° ° °  Post Anesthesia Home Care Instructions ° °Activity: °Get plenty of rest for the remainder of the day. A responsible adult should stay with you for 24 hours following the procedure.  °For the next 24 hours, DO NOT: °-Drive a car °-Operate machinery °-Drink alcoholic beverages °-Take any medication unless instructed by your physician °-Make any legal decisions or sign important papers. ° °Meals: °Start with liquid foods such as gelatin or soup. Progress to regular foods as tolerated. Avoid greasy, spicy, heavy foods. If nausea and/or vomiting occur, drink only clear liquids until the nausea and/or vomiting subsides. Call your physician if vomiting continues. ° °Special Instructions/Symptoms: °Your throat may feel dry or sore from the anesthesia or the breathing tube placed in your throat during surgery. If this causes discomfort, gargle with warm salt water. The discomfort should disappear within 24 hours. ° °If you had a scopolamine patch placed behind your ear for the management of post- operative nausea and/or vomiting: ° °1. The medication in the patch is effective for 72 hours, after which it should be removed.  Wrap patch in a tissue and discard in the trash. Wash hands thoroughly with soap and water. °2. You may remove the patch earlier than 72 hours if you experience unpleasant side effects which may include dry mouth, dizziness or visual disturbances. °3. Avoid touching the patch. Wash your hands with soap and water after contact with the patch. °  ° °

## 2016-03-24 NOTE — Op Note (Signed)
Preoperative diagnosis: Stage I right breast cancer  Postoperative diagnosis: Same  Procedure: Right breast seed localized partial mastectomy with right axillary sentinel lymph node mapping  Surgeon: Erroll Luna M.D.  Anesthesia: LMA with pectoral block and 0.25% Sensorcaine local with epinephrine  EBL: Minimal  : Right breast mass with clip and seed in specimen with additional superior and anterior margins into the right axillary sentinel nodes  Drains: None  Indications for procedure: The patient presents for right breast partial mastectomy and right axillary sentinel mapping procedure 1 right breast cancer. She was seen in the office in her films and biopsy results were reviewed. Surgical options were discussed with her to include mastectomy versus breast conservation. Risks, benefits and long term expectations and survival of each were discussed. She was to proceed.The procedure has been discussed with the patient. Alternatives to surgery have been discussed with the patient.  Risks of surgery include bleeding,  Infection,  Seroma formation, death,  and the need for further surgery.   The patient understands and wishes to proceed.Sentinel lymph node mapping and dissection has been discussed with the patient.  Risk of bleeding,  Infection,  Seroma formation,  Additional procedures,,  Shoulder weakness ,  Shoulder stiffness,  Nerve and blood vessel injury and reaction to the mapping dyes have been discussed.  Alternatives to surgery have been discussed with the patient.  The patient agrees to proceed.  Description of procedure: The patient was met in the holding area and questions are answered. Neoprobe was used to identify the seen in the right breast without difficulty. She underwent injection of the right breast with technetium colloid. He was taken back to the operating room and placed upon the OR table. After induction of LMA anesthesia, the right breast was prepped and draped in sterile  fashion. Timeout was done. Neoprobe was used to identify the hot spot contained is seen clip in the right upper outer quadrant. Transverse incision was made over this and all tissue around the seed clip were excised. Additional superior anterior margins were taken since this was grossly) negative. Radiograph showed the clip and seed to be in the specimen. This was passed off the field after orienting the specimen. Cavity is found to be hemostatic. Clips reached market Was closed with 3-0 Vicryl for Monocryl.  Right axilla was now done next. Neoprobe settings were changed to technetium. 3 cm incision was made in the right axilla with the help of the neoprobe we were able to dissect out to sentinel nodes a left axilla. Background counts approached 0. We was hemostatic and then closed with 3-0 Vicryl and 4-0 Monocryl. Liquid adhesive applied to both incisions. Breast binder placed. All counts found to be correct. Patient was awoke extubated taken to recovery in satisfactory condition.

## 2016-03-25 ENCOUNTER — Encounter (HOSPITAL_BASED_OUTPATIENT_CLINIC_OR_DEPARTMENT_OTHER): Payer: Self-pay | Admitting: Surgery

## 2016-04-01 ENCOUNTER — Encounter: Payer: Self-pay | Admitting: *Deleted

## 2016-04-01 ENCOUNTER — Telehealth: Payer: Self-pay | Admitting: *Deleted

## 2016-04-01 NOTE — Telephone Encounter (Signed)
  Oncology Nurse Navigator Documentation                Patient Visit Type: MedOnc;Initial (04/01/16 1520)   Barriers/Navigation Needs: No barriers at this time (04/01/16 1520)                Acuity: Level 1 (04/01/16 1520) Acuity Level 1: Initial guidance, education and coordination as needed;Minimal follow up required (04/01/16 1520)       Time Spent with Patient: 15 (04/01/16 1520)

## 2016-04-01 NOTE — Telephone Encounter (Signed)
Received order per Dr. Jana Hakim for Oncotype Testing. Requisition sent to pathology.

## 2016-04-03 ENCOUNTER — Other Ambulatory Visit: Payer: Self-pay | Admitting: Oncology

## 2016-04-04 ENCOUNTER — Telehealth: Payer: Self-pay | Admitting: Oncology

## 2016-04-04 NOTE — Telephone Encounter (Signed)
Spoke with pt to confirm OCT appt date/time per GM los

## 2016-04-08 ENCOUNTER — Encounter (HOSPITAL_COMMUNITY): Payer: Self-pay

## 2016-04-11 ENCOUNTER — Telehealth: Payer: Self-pay | Admitting: Oncology

## 2016-04-11 NOTE — Telephone Encounter (Signed)
Faxed pt records to [facility] [phone]

## 2016-04-12 ENCOUNTER — Telehealth: Payer: Self-pay | Admitting: *Deleted

## 2016-04-12 NOTE — Telephone Encounter (Signed)
Received Oncotype Dx results of 16/10%.  Placed a copy in Dr. [name] box, gave Varney Biles a copy and took a copy to HIM to scan.

## 2016-04-14 ENCOUNTER — Telehealth: Payer: Self-pay | Admitting: *Deleted

## 2016-04-14 NOTE — Telephone Encounter (Signed)
Called pt with oncotype score of 16 and notified she will not need chemotherapy. Informed pt she will receive a call from Dr. Clabe Seal office with an appt for f/u and xrt discussion. Received verbal understanding. Denies needs at this time.

## 2016-04-15 ENCOUNTER — Telehealth: Payer: Self-pay | Admitting: *Deleted

## 2016-04-15 NOTE — Telephone Encounter (Signed)
On 04-15-16 fax consult note to lynn dennis cancer case manager  for cancer support program.

## 2016-04-18 NOTE — Progress Notes (Signed)
Location of Breast Cancer: right breast cancer  Histology per Pathology Report:   03/24/16 Diagnosis 1. Breast, partial mastectomy, Right with seed - INVASIVE LOBULAR CARCINOMA, GRADE 2, SPANNING 1.3 CM. - LOBULAR CARCINOMA IN SITU. - RESECTION MARGINS ARE NEGATIVE FOR CARCINOMA. - BIOPSY SITE. - SEE COMMENT AND ONCOLOGY TABLE. 2. Breast, excision, Right additional superior margin - BENIGN BREAST TISSUE. - NO MALIGNANCY IDENTIFIED. 3. Breast, excision, Right additional anterior margin - BENIGN ADIPOSE TISSUE. - NO MALIGNANCY IDENTIFIED. 4. Lymph node, sentinel, biopsy, Right axillary - ONE OF ONE LYMPH NODES NEGATIVE FOR CARCINOMA (0/1). 5. Lymph node, sentinel, biopsy, Right axillary - ONE OF ONE LYMPH NODES NEGATIVE FOR CARCINOMA (0/1). 6. Lymph node, sentinel, biopsy, Right axillary - ONE OF ONE LYMPH NODES NEGATIVE FOR CARCINOMA (0/1).  02/29/16 Diagnosis Breast, right, needle core biopsy, 12:00 o'clock, 8 CMFN - INVASIVE DUCTAL CARCINOMA. - SEE MICROSCOPIC DESCRIPTION.  Receptor Status: ER(95%), PR (2%), Her2-neu (neg), Ki-(10%)  Did patient present with symptoms (if so, please note symptoms) or was this found on screening mammography?: screening mammogram  Past/Anticipated interventions by surgeon, if any: 03/24/2016 -Procedure: RADIOACTIVE SEED GUIDED PARTIAL MASTECTOMY WITH AXILLARY SENTINEL LYMPH NODE BIOPSY;  Surgeon: Erroll Luna, MD   Past/Anticipated interventions by medical oncology, if any:  Antiestrogen therapy after radiation  Lymphedema issues, if any:  no    Pain issues, if any:  no   OB Gyn history: Patient is postmenopausal. menarche age 63, first live birth age 75. The patient went through menopause in her early 32s. She did not take hormone replacement. She did use oral contraceptives for about 10 years remotely, with no complications   SAFETY ISSUES:  Prior radiation? no  Pacemaker/ICD? no  Possible current pregnancy?no  Is the patient on  methotrexate? no  Current Complaints / other details:  Patient saw Dr. Brantley Stage on 04/15/16 and was approved to start radiation.  BP 134/73 (BP Location: Left Arm, Patient Position: Sitting)   Pulse 88   Temp 98.2 F (36.8 C) (Oral)   Ht '5\' 1"'  (1.549 m)   Wt 135 lb 8 oz (61.5 kg)   SpO2 100%   BMI 25.60 kg/m    Wt Readings from Last 3 Encounters:  04/20/16 135 lb 8 oz (61.5 kg)  03/24/16 136 lb (61.7 kg)  03/23/16 136 lb 8 oz (61.9 kg)

## 2016-04-20 ENCOUNTER — Encounter: Payer: Self-pay | Admitting: Radiation Oncology

## 2016-04-20 ENCOUNTER — Ambulatory Visit
Admission: RE | Admit: 2016-04-20 | Discharge: 2016-04-20 | Disposition: A | Discharge status: Home / Self Care | Payer: 59 | Source: Ambulatory Visit | Attending: Radiation Oncology | Admitting: Radiation Oncology

## 2016-04-20 DIAGNOSIS — Z510 Encounter for antineoplastic radiation therapy: Secondary | ICD-10-CM | POA: Diagnosis not present

## 2016-04-20 DIAGNOSIS — C50411 Malignant neoplasm of upper-outer quadrant of right female breast: Secondary | ICD-10-CM

## 2016-04-20 NOTE — Progress Notes (Signed)
Please see the Nurse Progress Note in the MD Initial Consult Encounter for this patient. 

## 2016-04-20 NOTE — Progress Notes (Signed)
Radiation Oncology         [phone] ________________________________  Name: Meghan Padilla MRN: [id number]  Date: 04/20/2016  DOB: 28-Jun-1951  Re-evaluation Note  CC: Osborne Casco, MD  Erroll Luna, MD    ICD-9-CM ICD-10-CM   1. Breast cancer of upper-outer quadrant of right female breast ([place]) 174.4 C50.411     Diagnosis: Stage IA (pT1c, pN0) invasive lobular carcinoma of the right breast (ER/PR +, HER2 -)  Interval Since Last Radiation: N/A  Narrative:  The patient returns today for a re-evaluation. The patient's initial consultation with radiation oncology was on 03/23/16.   On 03/24/16, the patient had a right lumpectomy and axillary lymph node biopsies. This revealed grade 2 invasive lobular carcinoma (spanning 1.3 cm), LCIS, and the resection margins were negative for carcinoma. Additional excisions of the superior and anterior margins were negative for malignancy. The majority of the tumor had a lobular morphology, although there were focal areas with apparent ductal type lumens (most prominent on the biopsy material). E-cadherin is diffusely negative in the invasive and in situ carcinoma, thus supporting an overall lobular phenotype. 3 biopsied lymph nodes were negative for malignancy. Onctoype Dx score was 16 (low risk), therefore chemotherapy was not recommended.  The patient presents today to discuss the role of radiation in the management of her disease.  On review of systems: The patient denies swelling, numbness, pain of the right breast.  ALLERGIES:  is allergic to sulfa antibiotics.  Meds: Current Outpatient Prescriptions  Medication Sig Dispense Refill  . atorvastatin (LIPITOR) 40 MG tablet Take 40 mg by mouth daily.    . metFORMIN (GLUCOPHAGE) 500 MG tablet Take 1,500 mg by mouth at bedtime.     Marland Kitchen omeprazole (PRILOSEC) 20 MG capsule Take 20 mg by mouth daily.    . quinapril-hydrochlorothiazide (ACCURETIC) 20-25 MG per tablet Take 1 tablet by mouth  daily.    . sitaGLIPtin (JANUVIA) 100 MG tablet Take 100 mg by mouth daily.    . solifenacin (VESICARE) 10 MG tablet Take 10 mg by mouth daily.    . nitrofurantoin, macrocrystal-monohydrate, (MACROBID) 100 MG capsule Take 100 mg by mouth 2 (two) times daily.  1  . oxyCODONE-acetaminophen (ROXICET) 5-325 MG tablet Take 1-2 tablets by mouth every 4 (four) hours as needed. (Patient not taking: Reported on 04/20/2016) 20 tablet 0   No current facility-administered medications for this encounter.     Physical Findings: The patient is in no acute distress. Patient is alert and oriented.  height is '5\' 1"'  (1.549 m) and weight is 135 lb 8 oz (61.5 kg). Her oral temperature is 98.2 F (36.8 C). Her blood pressure is 134/73 and her pulse is 88. Her oxygen saturation is 100%.   Lungs are clear to auscultation bilaterally. Heart has regular rate and rhythm. No palpable cervical, supraclavicular, or axillary adenopathy. Right breast shows a well healing scar in the UOQ which is healing well without sign of drainage or infection. No dominant mass appreciated in the breast. Separate scar in the right axillary region also healing well.  Lab Findings: Lab Results  Component Value Date   WBC 9.3 03/17/2016   HGB 12.5 03/17/2016   HCT 37.7 03/17/2016   MCV 83.2 03/17/2016   PLT 227 03/17/2016    Radiographic Findings: Nm Sentinel Node Inj-no Rpt (breast)  Result Date: 03/24/2016 CLINICAL DATA: right breast cancer Sulfur colloid was injected intradermally by the nuclear medicine technologist for breast cancer sentinel node localization.   Mm Breast  Surgical Specimen  Result Date: 03/24/2016 CLINICAL DATA:  65 year old female with recently diagnosed right breast cancer post lumpectomy. EXAM: SPECIMEN RADIOGRAPH OF THE RIGHT BREAST COMPARISON:  Previous exam(s). FINDINGS: Status post excision of the right breast. The radioactive seed and biopsy marker clip are present, completely intact, and were marked for  pathology. IMPRESSION: Specimen radiograph of the right breast. Electronically Signed   By: Everlean Alstrom M.D.   On: 03/24/2016 13:55   Mm Rt Radioactive Seed Loc Mammo Guide  Result Date: 03/22/2016 CLINICAL DATA:  65 year old female with recently diagnosed invasive right breast cancer. EXAM: MAMMOGRAPHIC GUIDED RADIOACTIVE SEED LOCALIZATION OF THE RIGHT BREAST COMPARISON:  Previous exam(s). FINDINGS: Patient presents for radioactive seed localization prior to right breast lumpectomy. I met with the patient and we discussed the procedure of seed localization including benefits and alternatives. We discussed the high likelihood of a successful procedure. We discussed the risks of the procedure including infection, bleeding, tissue injury and further surgery. We discussed the low dose of radioactivity involved in the procedure. Informed, written consent was given. The usual time-out protocol was performed immediately prior to the procedure. Using mammographic guidance, sterile technique, 1% lidocaine and an I-125 radioactive seed, the mass with associated ribbon shaped biopsy marking clip was localized using a superior to inferior approach. The follow-up mammogram images confirm the seed in the expected location and were marked for Dr. Brantley Stage. Follow-up survey of the patient confirms presence of the radioactive seed. Order number of I-125 seed:  [id number]. Total activity:  0.253 mCi  Reference Date: 03/18/2016 The patient tolerated the procedure well and was released from the [facility]. She was given instructions regarding seed removal. IMPRESSION: Radioactive seed localization right breast. No apparent complications. Electronically Signed   By: Everlean Alstrom M.D.   On: 03/22/2016 14:35    Impression:  Stage IA (pT1c, pN0) invasive lobular carcinoma of the right breast (ER/PR +, HER2 -)  The patient is a good candidate for breast conservation therapy with radiation directed to the right  breast.  Plan: I spoke to the patient today regarding her diagnosis and options for treatment. We discussed the equivalence in terms of survival and local failure between mastectomy and breast conservation. We discussed the role of radiation in decreasing local failures in patients who undergo lumpectomy. We discussed the process of simulation and the placement tattoos. We discussed 4-6 weeks of treatment as an outpatient. We discussed the possibility of asymptomatic lung damage. We discussed the low likelihood of secondary malignancies. We discussed the possible side effects including but not limited to skin redness, fatigue, permanent skin darkening, and breast swelling.  The patient signed a consent form and this was placed in her medical chart. CT simulation is scheduled 04/26/16 at 3PM. The patient will be on vacation from 04/27/16 - 05/01/16 and she would like to begin treatment Tuesday or Wednesday the week she comes back.  ____________________________________ -----------------------------------  Blair Promise, PhD, MD  This document serves as a record of services personally performed by Gery Pray, MD. It was created on his behalf by Darcus Austin, a trained medical scribe. The creation of this record is based on the scribe's personal observations and the provider's statements to them. This document has been checked and approved by the attending provider.

## 2016-04-26 ENCOUNTER — Ambulatory Visit
Admission: RE | Admit: 2016-04-26 | Discharge: 2016-04-26 | Disposition: A | Discharge status: Home / Self Care | Payer: 59 | Source: Ambulatory Visit | Attending: Radiation Oncology | Admitting: Radiation Oncology

## 2016-04-26 DIAGNOSIS — C50411 Malignant neoplasm of upper-outer quadrant of right female breast: Secondary | ICD-10-CM

## 2016-04-26 DIAGNOSIS — Z510 Encounter for antineoplastic radiation therapy: Secondary | ICD-10-CM | POA: Diagnosis not present

## 2016-04-26 NOTE — Progress Notes (Signed)
  Radiation Oncology         [phone] ________________________________  Name: Meghan Padilla MRN: [id number]  Date: 04/26/2016  DOB: [birth year]  SIMULATION AND TREATMENT PLANNING NOTE   DIAGNOSIS:  Stage IA (pT1c, pN0) invasive lobular carcinoma of the right breast (ER/PR +, HER2 -)  NARRATIVE:  The patient was brought to the [facility].  Identity was confirmed.  All relevant records and images related to the planned course of therapy were reviewed.  The patient freely provided informed written consent to proceed with treatment after reviewing the details related to the planned course of therapy. The consent form was witnessed and verified by the simulation staff.  Then, the patient was set-up in a stable reproducible  supine position for radiation therapy.  CT images were obtained.  Surface markings were placed.  The CT images were loaded into the planning software.  Then the target and avoidance structures were contoured.  Treatment planning then occurred.  The radiation prescription was entered and confirmed.  Then, I designed and supervised the construction of a total of 3 medically necessary complex treatment devices.  I have requested : 3D Simulation  I have requested a DVH of the following structures: heart, lungs, and lumpectomy cavity.  I have ordered: dose calc.  PLAN:  The patient will receive 50.4 Gy in 28 fractions and a boost to the lumpectomy cavity of 10 Gy in 5 fractions.  -----------------------------------  Blair Promise, PhD, MD  This document serves as a record of services personally performed by Gery Pray, MD. It was created on his behalf by Darcus Austin, a trained medical scribe. The creation of this record is based on the scribe's personal observations and the provider's statements to them. This document has been checked and approved by the attending provider.

## 2016-05-02 DIAGNOSIS — Z510 Encounter for antineoplastic radiation therapy: Secondary | ICD-10-CM | POA: Diagnosis not present

## 2016-05-03 ENCOUNTER — Ambulatory Visit
Admission: RE | Admit: 2016-05-03 | Discharge: 2016-05-03 | Disposition: A | Discharge status: Home / Self Care | Payer: 59 | Source: Ambulatory Visit | Attending: Radiation Oncology | Admitting: Radiation Oncology

## 2016-05-03 DIAGNOSIS — C50411 Malignant neoplasm of upper-outer quadrant of right female breast: Secondary | ICD-10-CM

## 2016-05-03 DIAGNOSIS — Z510 Encounter for antineoplastic radiation therapy: Secondary | ICD-10-CM | POA: Diagnosis not present

## 2016-05-03 NOTE — Progress Notes (Signed)
  Radiation Oncology         [phone] ________________________________  Name: Meghan Padilla MRN: [id number]  Date: 05/03/2016  DOB: 12-Oct-1950  Simulation Verification Note    ICD-9-CM ICD-10-CM   1. Breast cancer of upper-outer quadrant of right female breast ([place]) 174.4 C50.411     Status: outpatient  NARRATIVE: The patient was brought to the treatment unit and placed in the planned treatment position. The clinical setup was verified. Then port films were obtained and uploaded to the radiation oncology medical record software.  The treatment beams were carefully compared against the planned radiation fields. The position location and shape of the radiation fields was reviewed. They targeted volume of tissue appears to be appropriately covered by the radiation beams. Organs at risk appear to be excluded as planned.  Based on my personal review, I approved the simulation verification. The patient's treatment will proceed as planned.  -----------------------------------  Blair Promise, PhD, MD

## 2016-05-04 ENCOUNTER — Ambulatory Visit
Admission: RE | Admit: 2016-05-04 | Discharge: 2016-05-04 | Disposition: A | Discharge status: Home / Self Care | Payer: 59 | Source: Ambulatory Visit | Attending: Radiation Oncology | Admitting: Radiation Oncology

## 2016-05-04 DIAGNOSIS — Z510 Encounter for antineoplastic radiation therapy: Secondary | ICD-10-CM | POA: Diagnosis not present

## 2016-05-05 ENCOUNTER — Ambulatory Visit
Admission: RE | Admit: 2016-05-05 | Discharge: 2016-05-05 | Disposition: A | Discharge status: Home / Self Care | Payer: 59 | Source: Ambulatory Visit | Attending: Radiation Oncology | Admitting: Radiation Oncology

## 2016-05-05 DIAGNOSIS — Z510 Encounter for antineoplastic radiation therapy: Secondary | ICD-10-CM | POA: Diagnosis not present

## 2016-05-05 DIAGNOSIS — Z923 Personal history of irradiation: Secondary | ICD-10-CM

## 2016-05-05 HISTORY — DX: Personal history of irradiation: Z92.3

## 2016-05-06 ENCOUNTER — Ambulatory Visit
Admission: RE | Admit: 2016-05-06 | Discharge: 2016-05-06 | Disposition: A | Discharge status: Home / Self Care | Payer: 59 | Source: Ambulatory Visit | Attending: Radiation Oncology | Admitting: Radiation Oncology

## 2016-05-06 DIAGNOSIS — Z510 Encounter for antineoplastic radiation therapy: Secondary | ICD-10-CM | POA: Diagnosis not present

## 2016-05-09 ENCOUNTER — Ambulatory Visit: Payer: 59

## 2016-05-09 ENCOUNTER — Ambulatory Visit
Admission: RE | Admit: 2016-05-09 | Discharge: 2016-05-09 | Disposition: A | Discharge status: Home / Self Care | Payer: 59 | Source: Ambulatory Visit | Attending: Radiation Oncology | Admitting: Radiation Oncology

## 2016-05-09 DIAGNOSIS — Z510 Encounter for antineoplastic radiation therapy: Secondary | ICD-10-CM | POA: Diagnosis not present

## 2016-05-10 ENCOUNTER — Ambulatory Visit
Admission: RE | Admit: 2016-05-10 | Discharge: 2016-05-10 | Disposition: A | Discharge status: Home / Self Care | Payer: 59 | Source: Ambulatory Visit | Attending: Radiation Oncology | Admitting: Radiation Oncology

## 2016-05-10 ENCOUNTER — Encounter: Payer: Self-pay | Admitting: Radiation Oncology

## 2016-05-10 VITALS — BP 133/80 | HR 90 | Temp 98.9°F | Ht 61.0 in | Wt 135.8 lb

## 2016-05-10 DIAGNOSIS — C50411 Malignant neoplasm of upper-outer quadrant of right female breast: Secondary | ICD-10-CM

## 2016-05-10 DIAGNOSIS — Z510 Encounter for antineoplastic radiation therapy: Secondary | ICD-10-CM | POA: Diagnosis not present

## 2016-05-10 MED ORDER — RADIAPLEXRX EX GEL
Freq: Once | CUTANEOUS | Status: AC
  Administered 2016-05-10: 16:00:00 via TOPICAL

## 2016-05-10 MED ORDER — ALRA NON-METALLIC DEODORANT (RAD-ONC)
1.0000 "application " | Freq: Once | TOPICAL | Status: AC
  Administered 2016-05-10: 1 via TOPICAL

## 2016-05-10 NOTE — Progress Notes (Signed)
  Radiation Oncology         [phone] ________________________________  Name: Meghan Padilla MRN: [id number]  Date: 05/10/2016  DOB: 04-May-1951  Weekly Radiation Therapy Management      ICD-9-CM ICD-10-CM   1. Breast cancer of upper-outer quadrant of right female breast ([place]) 174.4 C50.411     Stage IA (pT1c, pN0) invasive lobular carcinoma of the right breast (ER/PR +, HER2 -)    Current Dose: 13.35 Gy     Planned Dose:  42.72 Gy  Narrative . . . . . . . . The patient presents for routine under treatment assessment.                                   The patient is without complaint.                                 Set-up films were reviewed. Meghan Padilla has completed 5 fractions to her right breast.  She reports having dull pains in her left breast that last 2-3 days last week.  She is wondering if this could be nerve pain.  She denies having fatigue.  The skin on her right breast is intact.                                   The chart was checked. Physical Findings. . .  height is _0  (1.549 m) and weight is 135 lb 12.8 oz (61.6 kg). Her oral temperature is 98.9 F (37.2 C). Her blood pressure is 133/80 and her pulse is 90. Her oxygen saturation is 98%. . Weight essentially stable.  No significant changes. Lungs are clear to auscultation bilaterally. Heart has regular rate and rhythm. No palpable cervical, supraclavicular, or axillary adenopathy. Abdomen soft, non-tender, normal bowel sounds. No radiation reaction at this point in the right breast. No palpable mass within the left breast.  Impression . . . . . . . The patient is tolerating radiation. Plan . . . . . . . . . . . . Continue treatment as planned.  ________________________________   Blair Promise, PhD, MD  This document serves as a record of services personally performed by Gery Pray, MD. It was created on his behalf by Truddie Hidden, a trained medical scribe. The creation of this record is based on the  scribe's personal observations and the provider's statements to them. This document has been checked and approved by the attending provider.

## 2016-05-10 NOTE — Progress Notes (Signed)
Pt here for patient teaching.  Pt given Radiation and You booklet, skin care instructions, Alra deodorant and Radiaplex gel.  Reviewed areas of pertinence such as fatigue, skin changes, breast tenderness and breast swelling . Pt able to give teach back of to pat skin and use unscented/gentle soap,apply Radiaplex bid, avoid applying anything to skin within 4 hours of treatment and to use an electric razor if they must shave. Pt demonstrated understanding and verbalizes understanding of information given and will contact nursing with any questions or concerns.          

## 2016-05-10 NOTE — Progress Notes (Signed)
Meghan Padilla has completed 5 fractions to her right breast.  She reports having dull pains in her left breast that last 2-3 days last week.  She is wondering if this could be nerve pain.  She denies having fatigue.  The skin on her right breast is intact.  BP 133/80 (BP Location: Left Arm, Patient Position: Sitting)   Pulse 90   Temp 98.9 F (37.2 C) (Oral)   Ht 5\' 1"  (1.549 m)   Wt 135 lb 12.8 oz (61.6 kg)   SpO2 98%   BMI 25.66 kg/m    Wt Readings from Last 3 Encounters:  05/10/16 135 lb 12.8 oz (61.6 kg)  04/20/16 135 lb 8 oz (61.5 kg)  03/24/16 136 lb (61.7 kg)

## 2016-05-11 ENCOUNTER — Ambulatory Visit
Admission: RE | Admit: 2016-05-11 | Discharge: 2016-05-11 | Disposition: A | Discharge status: Home / Self Care | Payer: 59 | Source: Ambulatory Visit | Attending: Radiation Oncology | Admitting: Radiation Oncology

## 2016-05-11 DIAGNOSIS — Z510 Encounter for antineoplastic radiation therapy: Secondary | ICD-10-CM | POA: Diagnosis not present

## 2016-05-12 ENCOUNTER — Telehealth: Payer: Self-pay | Admitting: *Deleted

## 2016-05-12 ENCOUNTER — Ambulatory Visit
Admission: RE | Admit: 2016-05-12 | Discharge: 2016-05-12 | Disposition: A | Discharge status: Home / Self Care | Payer: 59 | Source: Ambulatory Visit | Attending: Radiation Oncology | Admitting: Radiation Oncology

## 2016-05-12 DIAGNOSIS — Z510 Encounter for antineoplastic radiation therapy: Secondary | ICD-10-CM | POA: Diagnosis not present

## 2016-05-12 NOTE — Telephone Encounter (Signed)
  Oncology Nurse Navigator Documentation  Navigator Location: [facility] (05/12/16 1400) Navigator Encounter Type: Telephone (left vm to assess needs during xrt) (05/12/16 1400) Telephone: [facility] Call (contact information provided) (05/12/16 1400) Abnormal Finding Date: 02/24/16 (05/12/16 1400) Confirmed Diagnosis Date: 03/31/16 (05/12/16 1400) Surgery Date: 03/24/16 (05/12/16 1400) Treatment Initiated Date: 03/24/16 (05/12/16 1400) Patient Visit Type: RadOnc (05/12/16 1400) Treatment Phase: First Radiation Tx (05/12/16 1400)                            Time Spent with Patient: 15 (05/12/16 1400)

## 2016-05-13 ENCOUNTER — Ambulatory Visit
Admission: RE | Admit: 2016-05-13 | Discharge: 2016-05-13 | Disposition: A | Discharge status: Home / Self Care | Payer: 59 | Source: Ambulatory Visit | Attending: Radiation Oncology | Admitting: Radiation Oncology

## 2016-05-13 DIAGNOSIS — Z510 Encounter for antineoplastic radiation therapy: Secondary | ICD-10-CM | POA: Diagnosis not present

## 2016-05-16 ENCOUNTER — Ambulatory Visit
Admission: RE | Admit: 2016-05-16 | Discharge: 2016-05-16 | Disposition: A | Discharge status: Home / Self Care | Payer: 59 | Source: Ambulatory Visit | Attending: Radiation Oncology | Admitting: Radiation Oncology

## 2016-05-16 DIAGNOSIS — Z510 Encounter for antineoplastic radiation therapy: Secondary | ICD-10-CM | POA: Diagnosis not present

## 2016-05-17 ENCOUNTER — Ambulatory Visit
Admission: RE | Admit: 2016-05-17 | Discharge: 2016-05-17 | Disposition: A | Discharge status: Home / Self Care | Payer: 59 | Source: Ambulatory Visit | Attending: Radiation Oncology | Admitting: Radiation Oncology

## 2016-05-17 ENCOUNTER — Encounter: Payer: Self-pay | Admitting: Radiation Oncology

## 2016-05-17 VITALS — BP 134/81 | HR 79 | Temp 98.3°F | Ht 61.0 in | Wt 136.0 lb

## 2016-05-17 DIAGNOSIS — C50411 Malignant neoplasm of upper-outer quadrant of right female breast: Secondary | ICD-10-CM

## 2016-05-17 DIAGNOSIS — Z510 Encounter for antineoplastic radiation therapy: Secondary | ICD-10-CM | POA: Diagnosis not present

## 2016-05-17 NOTE — Progress Notes (Signed)
Jode Nido has completed 10 fractions to her right breast.  She denies having pain.  She does report having slight fatigue.  She is using radiaplex gel BID.  The skin on her right breast is pink with hyperpigmentation.  BP 134/81 (BP Location: Left Arm, Patient Position: Sitting)   Pulse 79   Temp 98.3 F (36.8 C) (Oral)   Ht 5\' 1"  (1.549 m)   Wt 136 lb (61.7 kg)   SpO2 100%   BMI 25.70 kg/m    Wt Readings from Last 3 Encounters:  05/17/16 136 lb (61.7 kg)  05/10/16 135 lb 12.8 oz (61.6 kg)  04/20/16 135 lb 8 oz (61.5 kg)

## 2016-05-17 NOTE — Progress Notes (Signed)
  Radiation Oncology         [phone] ________________________________  Name: Meghan Padilla MRN: [id number]  Date: 05/17/2016  DOB: [birth year]  Weekly Radiation Therapy Management    ICD-9-CM ICD-10-CM   1. Breast cancer of upper-outer quadrant of right female breast ([place]) 174.4 C50.411      Current Dose: 26.7 Gy     Planned Dose:  52.72 Gy  Narrative . . . . . . . . The patient presents for routine under treatment assessment.                                   The patient is without complaint Except for some mild fatigue.                                 Set-up films were reviewed.                                 The chart was checked. Physical Findings. . .  height is 5\' 1"  (1.549 m) and weight is 136 lb (61.7 kg). Her oral temperature is 98.3 F (36.8 C). Her blood pressure is 134/81 and her pulse is 79. Her oxygen saturation is 100%. . Weight essentially stable.  Mild erythema and hyperpigmentation changes in the right breast Impression . . . . . . . The patient is tolerating radiation. Plan . . . . . . . . . . . . Continue treatment as planned.  ________________________________   Blair Promise, PhD, MD

## 2016-05-18 ENCOUNTER — Ambulatory Visit
Admission: RE | Admit: 2016-05-18 | Discharge: 2016-05-18 | Disposition: A | Discharge status: Home / Self Care | Payer: 59 | Source: Ambulatory Visit | Attending: Radiation Oncology | Admitting: Radiation Oncology

## 2016-05-18 DIAGNOSIS — Z510 Encounter for antineoplastic radiation therapy: Secondary | ICD-10-CM | POA: Diagnosis not present

## 2016-05-19 ENCOUNTER — Ambulatory Visit
Admission: RE | Admit: 2016-05-19 | Discharge: 2016-05-19 | Disposition: A | Discharge status: Home / Self Care | Payer: 59 | Source: Ambulatory Visit | Attending: Radiation Oncology | Admitting: Radiation Oncology

## 2016-05-19 DIAGNOSIS — Z510 Encounter for antineoplastic radiation therapy: Secondary | ICD-10-CM | POA: Diagnosis not present

## 2016-05-20 ENCOUNTER — Ambulatory Visit
Admission: RE | Admit: 2016-05-20 | Discharge: 2016-05-20 | Disposition: A | Discharge status: Home / Self Care | Payer: 59 | Source: Ambulatory Visit | Attending: Radiation Oncology | Admitting: Radiation Oncology

## 2016-05-20 DIAGNOSIS — Z510 Encounter for antineoplastic radiation therapy: Secondary | ICD-10-CM | POA: Diagnosis not present

## 2016-05-23 ENCOUNTER — Ambulatory Visit
Admission: RE | Admit: 2016-05-23 | Discharge: 2016-05-23 | Disposition: A | Discharge status: Home / Self Care | Payer: 59 | Source: Ambulatory Visit | Attending: Radiation Oncology | Admitting: Radiation Oncology

## 2016-05-23 DIAGNOSIS — Z510 Encounter for antineoplastic radiation therapy: Secondary | ICD-10-CM | POA: Diagnosis not present

## 2016-05-24 ENCOUNTER — Encounter: Payer: Self-pay | Admitting: Radiation Oncology

## 2016-05-24 ENCOUNTER — Ambulatory Visit
Admission: RE | Admit: 2016-05-24 | Discharge: 2016-05-24 | Disposition: A | Discharge status: Home / Self Care | Payer: 59 | Source: Ambulatory Visit | Attending: Radiation Oncology | Admitting: Radiation Oncology

## 2016-05-24 VITALS — BP 136/74 | HR 81 | Temp 98.4°F | Ht 61.0 in | Wt 136.2 lb

## 2016-05-24 DIAGNOSIS — Z510 Encounter for antineoplastic radiation therapy: Secondary | ICD-10-CM | POA: Diagnosis not present

## 2016-05-24 DIAGNOSIS — C50411 Malignant neoplasm of upper-outer quadrant of right female breast: Secondary | ICD-10-CM

## 2016-05-24 MED ORDER — SONAFINE EX EMUL
1.0000 "application " | Freq: Once | CUTANEOUS | Status: AC
  Administered 2016-05-24: 1 via TOPICAL

## 2016-05-24 NOTE — Progress Notes (Signed)
Denies Malina has completed 15 fractions to her right breast.  She denies having pain.  She reports having slight fatigue.  She is using radiaplex.  She reports having itching.  She will be given sonafine to try. The skin on her right breast is red with dermatitis in the upper portion.   BP 136/74 (BP Location: Left Arm, Patient Position: Sitting)   Pulse 81   Temp 98.4 F (36.9 C) (Oral)   Ht 5\' 1"  (1.549 m)   Wt 136 lb 3.2 oz (61.8 kg)   SpO2 100%   BMI 25.73 kg/m    Wt Readings from Last 3 Encounters:  05/24/16 136 lb 3.2 oz (61.8 kg)  05/17/16 136 lb (61.7 kg)  05/10/16 135 lb 12.8 oz (61.6 kg)

## 2016-05-24 NOTE — Progress Notes (Signed)
  Radiation Oncology         [phone] ________________________________  Name: Meghan Padilla MRN: [id number]  Date: 05/24/2016  DOB: [birth year]  Electron beam Simulation Verification Note    ICD-9-CM ICD-10-CM   1. Malignant neoplasm of upper-outer quadrant of right female breast, unspecified estrogen receptor status ([place]) 174.4 C50.411     Status: outpatient  NARRATIVE: The patient was brought to the treatment unit and placed in the planned treatment position. The clinical setup was verified. Then port films were obtained and uploaded to the radiation oncology medical record software.  The treatment beams were carefully compared against the planned radiation fields. The position location and shape of the radiation fields was reviewed. They targeted volume of tissue appears to be appropriately covered by the radiation beams. Organs at risk appear to be excluded as planned.  Based on my personal review, I approved the simulation verification. The patient's treatment will proceed as planned.  -----------------------------------  Blair Promise, PhD, MD

## 2016-05-24 NOTE — Progress Notes (Signed)
  Radiation Oncology         (336) [phone] ________________________________  Name: Meghan Padilla MRN: [id number]  Date: 05/24/2016  DOB: August 02, 1951  Weekly Radiation Therapy Management    ICD-9-CM ICD-10-CM   1. Malignant neoplasm of upper-outer quadrant of right female breast, unspecified estrogen receptor status ([facility]) 174.4 C50.411 SONAFINE emulsion 1 application      Current Dose: 40.5 Gy     Planned Dose:  52.72+ Gy  Narrative . . . . . . . . The patient presents for routine under treatment assessment.                                  Meghan Padilla has completed 15 fractions to her right breast.  She denies having pain.  She reports having slight fatigue.  She is using radiaplex.  She reports having itching.  She will be given sonafine to try. The skin on her right breast is red with dermatitis in the upper portion.           Set-up films were reviewed.                                 The chart was checked. Physical Findings. . .  height is 5\' 1"  (1.549 m) and weight is 136 lb 3.2 oz (61.8 kg). Her oral temperature is 98.4 F (36.9 C). Her blood pressure is 136/74 and her pulse is 81. Her oxygen saturation is 100%. . Weight essentially stable.  Mild erythema and hyperpigmentation changes in the right breast. Lungs are clear to auscultation bilaterally. Heart has regular rate and rhythm. No palpable cervical, supraclavicular, or axillary adenopathy. Abdomen soft, non-tender, normal bowel sounds.  Impression . . . . . . . The patient is tolerating radiation. Plan . . . . . . . . . . . . Continue treatment as planned. The patient will begin her boost field soon.   ________________________________   Blair Promise, PhD, MD  This document serves as a record of services personally performed by Gery Pray, MD. It was created on his behalf by Truddie Hidden, a trained medical scribe. The creation of this record is based on the scribe's personal observations and the provider's statements  to them. This document has been checked and approved by the attending provider.

## 2016-05-25 ENCOUNTER — Ambulatory Visit
Admission: RE | Admit: 2016-05-25 | Discharge: 2016-05-25 | Disposition: A | Discharge status: Home / Self Care | Payer: 59 | Source: Ambulatory Visit | Attending: Radiation Oncology | Admitting: Radiation Oncology

## 2016-05-25 DIAGNOSIS — Z510 Encounter for antineoplastic radiation therapy: Secondary | ICD-10-CM | POA: Diagnosis not present

## 2016-05-26 ENCOUNTER — Ambulatory Visit
Admission: RE | Admit: 2016-05-26 | Discharge: 2016-05-26 | Disposition: A | Discharge status: Home / Self Care | Payer: 59 | Source: Ambulatory Visit | Attending: Radiation Oncology | Admitting: Radiation Oncology

## 2016-05-26 DIAGNOSIS — Z510 Encounter for antineoplastic radiation therapy: Secondary | ICD-10-CM | POA: Diagnosis not present

## 2016-05-27 ENCOUNTER — Ambulatory Visit
Admission: RE | Admit: 2016-05-27 | Discharge: 2016-05-27 | Disposition: A | Discharge status: Home / Self Care | Payer: 59 | Source: Ambulatory Visit | Attending: Radiation Oncology | Admitting: Radiation Oncology

## 2016-05-27 DIAGNOSIS — Z510 Encounter for antineoplastic radiation therapy: Secondary | ICD-10-CM | POA: Diagnosis not present

## 2016-05-30 ENCOUNTER — Ambulatory Visit
Admission: RE | Admit: 2016-05-30 | Discharge: 2016-05-30 | Disposition: A | Discharge status: Home / Self Care | Payer: 59 | Source: Ambulatory Visit | Attending: Radiation Oncology | Admitting: Radiation Oncology

## 2016-05-30 DIAGNOSIS — Z510 Encounter for antineoplastic radiation therapy: Secondary | ICD-10-CM | POA: Diagnosis not present

## 2016-05-31 ENCOUNTER — Ambulatory Visit
Admission: RE | Admit: 2016-05-31 | Discharge: 2016-05-31 | Disposition: A | Discharge status: Home / Self Care | Payer: 59 | Source: Ambulatory Visit | Attending: Radiation Oncology | Admitting: Radiation Oncology

## 2016-05-31 ENCOUNTER — Encounter: Payer: Self-pay | Admitting: Radiation Oncology

## 2016-05-31 VITALS — BP 127/79 | HR 86 | Temp 98.1°F | Resp 18 | Ht 61.0 in | Wt 135.0 lb

## 2016-05-31 DIAGNOSIS — Z510 Encounter for antineoplastic radiation therapy: Secondary | ICD-10-CM | POA: Diagnosis not present

## 2016-05-31 DIAGNOSIS — C50411 Malignant neoplasm of upper-outer quadrant of right female breast: Secondary | ICD-10-CM

## 2016-05-31 NOTE — Progress Notes (Addendum)
Denies Johnny has completed 20 fractions to her right breast.  She denies having pain andt fatigue.  She is using radiaplex.  She reports having itching under the inframay fold with moist desquamation, given telfa pad to apply.  Sonafine is helping with the itching.  The skin on her right breast is red with dermatitis in the upper portion ongoing, asked to [place] neosporin ointment per Dr. Sondra Come .  EOT instructions given with skin care, one month follow up card given to see Dr. Sondra Come, see education section for documentation. Wt Readings from Last 3 Encounters:  05/31/16 135 lb (61.2 kg)  05/24/16 136 lb 3.2 oz (61.8 kg)  05/17/16 136 lb (61.7 kg)  BP 127/79 (BP Location: Left Arm, Patient Position: Sitting, Cuff Size: Normal)   Pulse 86   Temp 98.1 F (36.7 C) (Oral)   Resp 18   Ht 5\' 1"  (1.549 m)   Wt 135 lb (61.2 kg)   SpO2 100%   BMI 25.51 kg/m

## 2016-05-31 NOTE — Progress Notes (Signed)
  Radiation Oncology         [phone]) [phone] ________________________________  Name: Meghan Padilla MRN: [id number]  Date: 05/31/2016  DOB: 1951/01/18  Weekly Radiation Therapy Management    ICD-9-CM ICD-10-CM   1. Malignant neoplasm of upper-outer quadrant of right female breast, unspecified estrogen receptor status (HCC) 174.4 C50.411        Current Dose: 50.72 Gy     Planned Dose:  52.72+ Gy  Narrative . . . . . . . . The patient presents for routine under treatment assessment.                                  Helaine Burfield has completed 20 fractions to her right breast. She denies having pain and fatigue. She is using radiaplex. She reports having itching under the inframmary fold with moist desquamation, given telfa pad to apply.  Sonafine is helping with the itching.  The skin on her right breast is red with dermatitis in the upper portion ongoing. The patient will be completing radiotherapy this week. She reports some irritation under her breast.             Set-up films were reviewed.                                 The chart was checked. Physical Findings. . .  height is 5\' 1"  (1.549 m) and weight is 135 lb (61.2 kg). Her oral temperature is 98.1 F (36.7 C). Her blood pressure is 127/79 and her pulse is 86. Her respiration is 18 and oxygen saturation is 100%. . Weight essentially stable.  Mild erythema and hyperpigmentation changes in the right breast. Lungs are clear to auscultation bilaterally. Heart has regular rate and rhythm. No palpable cervical, supraclavicular, or axillary adenopathy. Abdomen soft, non-tender, normal bowel sounds. Small area of moist desquamation in the inframammary fold, some area of radiation dermatitis in the upper inner aspect of the right breast.   Impression . . . . . . . The patient is tolerating radiation. Plan . . . . . . . . . . . . Continue treatment as planned. The patient will complete treatment this week, and I will follow up with the patient  in approximately 1 month. She will use a triple antibiotic ointment in the area where there is small amount of moist desquamation in the inframammary fold. She will also uses along the small area skin breakdown located in the area of radiation dermatitis in the upper inner aspect of the right breast.  ________________________________   Blair Promise, PhD, MD  This document serves as a record of services personally performed by Gery Pray, MD. It was created on his behalf by Truddie Hidden, a trained medical scribe. The creation of this record is based on the scribe's personal observations and the provider's statements to them. This document has been checked and approved by the attending provider.

## 2016-06-01 ENCOUNTER — Ambulatory Visit: Payer: 59

## 2016-06-01 ENCOUNTER — Encounter: Payer: Self-pay | Admitting: Radiation Oncology

## 2016-06-01 DIAGNOSIS — Z510 Encounter for antineoplastic radiation therapy: Secondary | ICD-10-CM | POA: Diagnosis not present

## 2016-06-02 ENCOUNTER — Ambulatory Visit: Payer: 59

## 2016-06-03 ENCOUNTER — Ambulatory Visit: Payer: 59

## 2016-06-05 NOTE — Progress Notes (Signed)
[place]  [phone] Fax:(336) [phone]     ID: Meghan Padilla DOB: 03-Sep-1950  MR#: [id number]  KZS#:[id number]  Patient Care Team: [name], MD as PCP - General (Family Medicine) Chauncey Cruel, MD as Consulting Physician (Oncology) Erroll Luna, MD as Consulting Physician (General Surgery) Gery Pray, MD as Consulting Physician (Radiation Oncology) Arvella Nigh, MD (Obstetrics and Gynecology) Franchot Gallo, MD as Consulting Physician (Urology) Wilford Corner, MD as Consulting Physician (Gastroenterology) OTHER MD:  CHIEF COMPLAINT: Estrogen receptor positive breast cancer  CURRENT TREATMENT: Anastrozole   BREAST CANCER HISTORY: From the original intake note:  Meghan Padilla routine [facility] showing a possible right breast distortion and or mass and possibly a right axillary lymph node. She was recalled for diagnostic right mammography with tomography and ultrasonography at the [facility] 02/24/2016. This found her right breast density to be category C. In the upper right breast there was a site of distortion. The mass of concern was consistent with an intramamm [name]. On exam there was no palpable abnormality. Ultrasonography found a hypoechoic irregular mass with spiculated margins at the 12:00 position of the right breast measuring 0.5 cm. Ultrasound of the right axilla showed multiple normal-appearing lymph nodes.  Biopsy of the right breast mass in question 02/29/2016 showed orifices [id number]) and invasive ductal carcinoma, grade 2, estrogen receptor 95% positive, with strong staining intensity, progesterone receptor 2% positive, with strong staining intensity, with an MIB-1 of 10%, and no HER-2 amplification, the signals ratio being 1.48 and the number per cell 2.30.  Her subsequent history is as detailed below.  INTERVAL HISTORY: Meghan Padilla returns today for  follow-up of her estrogen receptor positive breast cancer. After her last visit with me she underwent right lumpectomy and sentinel lymph node sampling, on 03/24/2016. This confirmed an invasive lobular carcinoma, grade 2, measuring 1.3 cm. Margins were ample. All 3 sentinel lymph nodes were clear. ([id number]).  An Oncotype DX was obtained on this material. Score of 16 predicts a 10 year risk of recurrence outside the breast of 10% if the patient's only systemic therapy is tamoxifen for 5 years. It also predicts no benefit from chemotherapy.  Accordingly the patient proceeded directly to adjuvant radiation, which was completed in 06/01/2016. She is here today to discuss anti-estrogens.   REVIEW OF SYSTEMS: She did well with the radiation, and had only mild fatigue. She never changed her normal daily routine. Towards the end she developed some redness and a rash. The breast is now very itchy, but not painful. Aside from those issues, she had multiple urinary tract infections last year but none recently. She tells me her blood sugars are well controlled and her stress urinary incontinence is well managed on Vesicare. A detailed review of systems today was otherwise stable  PAST MEDICAL HISTORY: Past Medical History:  Diagnosis Date  . Cancer [facility]) 2017   right breast  . Diabetes mellitus   . GERD (gastroesophageal reflux disease)   . Hyperlipidemia   . Hypertension   . UTI (urinary tract infection)     PAST SURGICAL HISTORY: Past Surgical History:  Procedure Laterality Date  . DILATION AND CURETTAGE OF UTERUS    . HAND SURGERY    . HAND SURGERY    . HYSTEROSCOPY W/D&C  11/28/2008  . RADIOACTIVE SEED GUIDED MASTECTOMY WITH AXILLARY SENTINEL LYMPH NODE BIOPSY Right 03/24/2016   Procedure: RADIOACTIVE SEED GUIDED PARTIAL MASTECTOMY WITH AXILLARY SENTINEL LYMPH  NODE BIOPSY;  Surgeon: Erroll Luna, MD;  Location: [facility];  Service: General;  Laterality: Right;  .  TONSILLECTOMY      FAMILY HISTORY Family History  Problem Relation Age of Onset  . Cancer Neg Hx    The patient's parents are still living, in their late 20s. The patient had no brothers, 2 sisters. There is no history of breast or ovarian or any other cancer in the family to the patient's knowledge   GYNECOLOGIC HISTORY:  No LMP recorded. Patient is postmenopausal.  menarche age 85, first live birth age 89. The patient went through menopause in her early 58s. She did not take hormone replacement. 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 Her husband [name] was a Dealer at [employer]. He is retired and disabled after multiple back surgeries. Daughter Meghan Padilla lives in [place] and is disabled secondary to cognitive and behavioral issues. Son Martinique Varin lives in [place] where he does car painting and body work. The patient has no grandchildren. She is a Tourist information centre manager.    ADVANCED DIRECTIVES: Not in place    HEALTH MAINTENANCE: Social History  Substance Use Topics  . Smoking status: Never Smoker  . Smokeless tobacco: Never Used  . Alcohol use No     Colonoscopy:  PAP:  Bone density:   Allergies  Allergen Reactions  . Sulfa Antibiotics Nausea Only    Current Outpatient Prescriptions  Medication Sig Dispense Refill  . anastrozole (ARIMIDEX) 1 MG tablet Take 1 tablet (1 mg total) by mouth daily. 90 tablet 4  . atorvastatin (LIPITOR) 40 MG tablet Take 40 mg by mouth daily.    . metFORMIN (GLUCOPHAGE) 500 MG tablet Take 1,500 mg by mouth at bedtime.     Marland Kitchen omeprazole (PRILOSEC) 20 MG capsule Take 20 mg by mouth daily.    . quinapril-hydrochlorothiazide (ACCURETIC) 20-25 MG per tablet Take 1 tablet by mouth daily.    . sitaGLIPtin (JANUVIA) 100 MG tablet Take 100 mg by mouth daily.    . solifenacin (VESICARE) 10 MG tablet Take 10 mg  by mouth daily.     No current facility-administered medications for this visit.     OBJECTIVE: Middle-aged white woman  Vitals:   06/06/16 1559  BP: (!) 144/69  Pulse: 95  Resp: 18  Temp: 98.3 F (36.8 C)     Body mass index is 25.87 kg/m.    ECOG FS:1 - Symptomatic but completely ambulatory  Sclerae unicteric, pupils round and equal Oropharynx clear and moist-- no thrush or other lesions No cervical or supraclavicular adenopathy Lungs no rales or rhonchi Heart regular rate and rhythm Abd soft, nontender, positive bowel sounds MSK no focal spinal tenderness, no upper extremity lymphedema Neuro: nonfocal, well oriented, appropriate affect Breasts: The right breast is status post lumpectomy and radiation. The overall cosmetic result is excellent. He is mild erythema over most of the breast and some desquamation over the area of the recent boost. There is no evidence of residual or recurrent disease. The right axilla is benign. The left breast is unremarkable.    LAB RESULTS:  CMP     Component Value Date/Time   NA 140 03/21/2016 1459   NA 142 03/17/2016 1540   K 3.8 03/21/2016 1459   K 3.3 (L) 03/17/2016 1540   CL 103 03/21/2016 1459   CO2  29 03/21/2016 1459   CO2 27 03/17/2016 1540   GLUCOSE 125 (H) 03/21/2016 1459   GLUCOSE 150 (H) 03/17/2016 1540   BUN 7 03/21/2016 1459   BUN 8.4 03/17/2016 1540   CREATININE 0.75 03/21/2016 1459   CREATININE 0.9 03/17/2016 1540   CALCIUM 9.4 03/21/2016 1459   CALCIUM 9.1 03/17/2016 1540   PROT 6.6 03/17/2016 1540   ALBUMIN 3.7 03/17/2016 1540   AST 23 03/17/2016 1540   ALT 26 03/17/2016 1540   ALKPHOS 78 03/17/2016 1540   BILITOT 0.34 03/17/2016 1540   GFRNONAA >60 03/21/2016 1459   GFRAA >60 03/21/2016 1459    INo results found for: SPEP, UPEP  Lab Results  Component Value Date   WBC 9.3 03/17/2016   NEUTROABS 5.6 03/17/2016   HGB 12.5 03/17/2016   HCT 37.7 03/17/2016   MCV 83.2 03/17/2016   PLT 227 03/17/2016       Chemistry      Component Value Date/Time   NA 140 03/21/2016 1459   NA 142 03/17/2016 1540   K 3.8 03/21/2016 1459   K 3.3 (L) 03/17/2016 1540   CL 103 03/21/2016 1459   CO2 29 03/21/2016 1459   CO2 27 03/17/2016 1540   BUN 7 03/21/2016 1459   BUN 8.4 03/17/2016 1540   CREATININE 0.75 03/21/2016 1459   CREATININE 0.9 03/17/2016 1540      Component Value Date/Time   CALCIUM 9.4 03/21/2016 1459   CALCIUM 9.1 03/17/2016 1540   ALKPHOS 78 03/17/2016 1540   AST 23 03/17/2016 1540   ALT 26 03/17/2016 1540   BILITOT 0.34 03/17/2016 1540       No results found for: LABCA2  No components found for: LABCA125  No results for input(s): INR in the last 168 hours.  Urinalysis    Component Value Date/Time   COLORURINE YELLOW 01/28/2012 1952   APPEARANCEUR CLEAR 01/28/2012 1952   LABSPEC 1.006 01/28/2012 1952   PHURINE 7.0 01/28/2012 1952   GLUCOSEU NEGATIVE 01/28/2012 1952   HGBUR NEGATIVE 01/28/2012 1952   BILIRUBINUR NEGATIVE 01/28/2012 1952   KETONESUR NEGATIVE 01/28/2012 1952   PROTEINUR NEGATIVE 01/28/2012 1952   UROBILINOGEN 0.2 01/28/2012 1952   NITRITE NEGATIVE 01/28/2012 1952   LEUKOCYTESUR TRACE (A) 01/28/2012 1952     STUDIES: No results found.  ELIGIBLE FOR AVAILABLE RESEARCH PROTOCOL: no  ASSESSMENT: 65 y.o. [place] woman status post right breast upper outer quadrant biopsy 02/29/2016 for a clinical T1a N0, stage IA invasive ductal carcinoma, grade 2, estrogen receptor positive, progesterone receptor positive at 2%, HER-2 nonamplified, with an MIB-1 of 10%  (1) breast conserving 03/24/2016 confirmed a pT1c pN0, stage IA invasive lobular carcinoma, grade 2, with negative margins  (2) Oncotype DX score of 16 ("low risk" "), predicts a risk of outside the breast recurrence within 10 years of 10% if the patient's only systemic therapy is tamoxifen for 5 years. It also predicts no benefit from adjuvant chemotherapy.  (3) adjuvant radiation completed  06/01/2016.  (4) to start anastrozole 07/22/2016  PLAN: Walterine did well with her radiation treatments and she is now ready to start anti-estrogens. We discussed the difference between tamoxifen and anastrozole in detail. She understands that anastrozole and the aromatase inhibitors in general work by blocking estrogen production. Accordingly vaginal dryness, decrease in bone density, and of course hot flashes can result. The aromatase inhibitors can also negatively affect the cholesterol profile, although that is a minor effect. One out of 5 women on aromatase inhibitors we  will feel "old and achy". This arthralgia/myalgia syndrome, which resembles fibromyalgia clinically, does resolve with stopping the medications. Accordingly this is not a reason to not try an aromatase inhibitor but it is a frequent reason to stop it (in other words 20% of women will not be able to tolerate these medications).  Tamoxifen on the other hand does not block estrogen production. It does not "take away a woman's estrogen". It blocks the estrogen receptor in breast cells. Like anastrozole, it can also cause hot flashes. As opposed to anastrozole, tamoxifen has many estrogen-like effects. It is technically an estrogen receptor modulator. This means that in some tissues tamoxifen works like estrogen-- for example it helps strengthen the bones. It tends to improve the cholesterol profile. It can cause thickening of the endometrial lining, and even endometrial polyps or rarely cancer of the uterus.(The risk of uterine cancer due to tamoxifen is one additional cancer per thousand women year). It can cause vaginal wetness or stickiness. It can cause blood clots through this estrogen-like effect--the risk of blood clots with tamoxifen is exactly the same as with birth control pills or hormone replacement.  Neither of these agents causes mood changes or weight gain, despite the popular belief that they can have these side effects. We  have data from studies comparing either of these drugs with placebo, and in those cases the control group had the same amount of weight gain and depression as the group that took the drug.  After discussing all this information, which was also given to [place] in writing, we decided to start with anastrozole. The key motivator in this case was her history of uterine polyps, which makes sense shy away from tamoxifen  I would like her to recover a little bit more from her radiation treatments, so she will not start the anastrozole until December 1. It usually takes a couple months to develops the arthralgias and myalgias, if she is going to experience them, so she will see me again in approximately 3 months from now.   She knows to call for any problems that may develop before the next visit here.    Chauncey Cruel, MD   06/06/2016 5:23 PM Medical Oncology and Hematology [facility] [address] [place], [place] [address] Tel. [phone]    Fax. [phone]

## 2016-06-06 ENCOUNTER — Ambulatory Visit: Payer: 59

## 2016-06-06 ENCOUNTER — Ambulatory Visit (HOSPITAL_BASED_OUTPATIENT_CLINIC_OR_DEPARTMENT_OTHER): Payer: 59 | Admitting: Oncology

## 2016-06-06 VITALS — BP 144/69 | HR 95 | Temp 98.3°F | Resp 18 | Ht 61.0 in | Wt 136.9 lb

## 2016-06-06 DIAGNOSIS — Z170 Estrogen receptor positive status [ER+]: Secondary | ICD-10-CM | POA: Diagnosis not present

## 2016-06-06 DIAGNOSIS — C50411 Malignant neoplasm of upper-outer quadrant of right female breast: Secondary | ICD-10-CM | POA: Diagnosis not present

## 2016-06-06 MED ORDER — ANASTROZOLE 1 MG PO TABS: 1.0000 mg | ORAL_TABLET | Freq: Every day | ORAL | 4 refills | Status: DC

## 2016-06-07 ENCOUNTER — Ambulatory Visit: Payer: 59

## 2016-06-08 ENCOUNTER — Ambulatory Visit: Payer: 59

## 2016-06-09 ENCOUNTER — Ambulatory Visit: Payer: 59

## 2016-06-10 ENCOUNTER — Ambulatory Visit: Payer: 59

## 2016-06-13 ENCOUNTER — Ambulatory Visit: Payer: 59

## 2016-06-14 ENCOUNTER — Ambulatory Visit: Payer: 59

## 2016-06-14 NOTE — Progress Notes (Signed)
  Radiation Oncology         [phone] ________________________________  Name: Meghan Padilla MRN: [id number]  Date: 06/01/2016  DOB: January 25, 1951  End of Treatment Note  Diagnosis:   Stage IA (pT1c, pN0) invasive lobular carcinoma of the right breast (ER/PR +, HER2 -)     Indication for treatment: Post-operative to reduce the risk of recurrence       Radiation treatment dates:  05/04/16 - 06/01/16  Site/dose:  1) Right Breast: 42.72 Gy in 16 fractions   2) Right Breast Boost: 10 Gy in 5 fractions  Beams/energy:  1) 3D // 10X, 6X Photon   2) En Face // 12 MeV Electron  Narrative: The patient tolerated radiation treatment relatively well. Towards the end of treatment, the patient reported pruritus underneath the right inframammary fold. The patient had a small area of moist desquamation in the inframammary fold and an area of radiation dermatitis in the upper inner aspect of the right breast. The patient was provided with Sonafine and advised to apply triple antibiotic ointment in the area of moist desquamation.  Plan: The patient has completed radiation treatment. The patient will return to radiation oncology clinic for routine followup in one month. I advised them to call or return sooner if they have any questions or concerns related to their recovery or treatment.  -----------------------------------  Blair Promise, PhD, MD  This document serves as a record of services personally performed by Gery Pray, MD. It was created on his behalf by Darcus Austin, a trained medical scribe. The creation of this record is based on the scribe's personal observations and the provider's statements to them. This document has been checked and approved by the attending provider.

## 2016-06-15 ENCOUNTER — Ambulatory Visit: Payer: 59

## 2016-06-16 ENCOUNTER — Ambulatory Visit: Payer: 59

## 2016-06-17 ENCOUNTER — Ambulatory Visit: Payer: 59

## 2016-06-17 ENCOUNTER — Encounter: Payer: Self-pay | Admitting: *Deleted

## 2016-06-28 ENCOUNTER — Telehealth: Payer: Self-pay | Admitting: General Practice

## 2016-06-28 NOTE — Telephone Encounter (Signed)
Spoke with patient confirmed Feb 2018 appointment.

## 2016-07-18 ENCOUNTER — Encounter: Payer: Self-pay | Admitting: Oncology

## 2016-07-21 ENCOUNTER — Encounter: Payer: Self-pay | Admitting: Radiation Oncology

## 2016-07-21 ENCOUNTER — Ambulatory Visit
Admission: RE | Admit: 2016-07-21 | Discharge: 2016-07-21 | Disposition: A | Discharge status: Home / Self Care | Payer: 59 | Source: Ambulatory Visit | Attending: Radiation Oncology | Admitting: Radiation Oncology

## 2016-07-21 DIAGNOSIS — C50411 Malignant neoplasm of upper-outer quadrant of right female breast: Secondary | ICD-10-CM | POA: Insufficient documentation

## 2016-07-21 DIAGNOSIS — Z170 Estrogen receptor positive status [ER+]: Secondary | ICD-10-CM | POA: Diagnosis not present

## 2016-07-21 DIAGNOSIS — Z7984 Long term (current) use of oral hypoglycemic drugs: Secondary | ICD-10-CM | POA: Diagnosis not present

## 2016-07-21 NOTE — Progress Notes (Signed)
  Radiation Oncology         [phone] ________________________________  Name: Meghan Padilla MRN: [id number]  Date: 07/21/2016  DOB: Jul 14, 1951  Follow-Up Visit Note  CC: Osborne Casco, MD  Erroll Luna, MD    ICD-9-CM ICD-10-CM   1. Malignant neoplasm of upper-outer quadrant of right breast in female, estrogen receptor positive ([place]) 174.4 C50.411    V86.0 Z17.0     Diagnosis:   Stage IA (pT1c, pN0) invasive lobular carcinoma of the right breast (ER/PR +, HER2 -)     Interval Since Last Radiation:  6 weeks 05/04/16-06/01/16 52.72 Gy to the right breast+boost  Narrative:  The patient returns today for routine follow-up. Patient denies having pain and reports improving energy level. She will start Arimidex tomorrow. Per nurse's exam, skin on the right breast has hyperpigmentation with some redness on her lumpectomy scar.                              ALLERGIES:  is allergic to sulfa antibiotics.  Meds: Current Outpatient Prescriptions  Medication Sig Dispense Refill  . atorvastatin (LIPITOR) 40 MG tablet Take 40 mg by mouth daily.    . metFORMIN (GLUCOPHAGE) 500 MG tablet Take 1,500 mg by mouth at bedtime.     Marland Kitchen omeprazole (PRILOSEC) 20 MG capsule Take 20 mg by mouth daily.    . quinapril-hydrochlorothiazide (ACCURETIC) 20-25 MG per tablet Take 1 tablet by mouth daily.    . sitaGLIPtin (JANUVIA) 100 MG tablet Take 100 mg by mouth daily.    . solifenacin (VESICARE) 10 MG tablet Take 10 mg by mouth daily.    Marland Kitchen anastrozole (ARIMIDEX) 1 MG tablet Take 1 tablet (1 mg total) by mouth daily. (Patient not taking: Reported on 07/21/2016) 90 tablet 4   No current facility-administered medications for this encounter.     Physical Findings: The patient is in no acute distress. Patient is alert and oriented.  height is _0  (1.549 m) and weight is 138 lb 9.6 oz (62.9 kg). Her oral temperature is 98.4 F (36.9 C). Her blood pressure is 124/69 and her pulse is 87. Her oxygen  saturation is 100%. .  No significant changes. Skin has healed well. Mild hyperpigmentation noted in the treatment area, and mild edema noted in the nipple areolar complex.  Lab Findings: Lab Results  Component Value Date   WBC 9.3 03/17/2016   HGB 12.5 03/17/2016   HCT 37.7 03/17/2016   MCV 83.2 03/17/2016   PLT 227 03/17/2016    Radiographic Findings: No results found.  Impression:  No evidence of recurrence on clinical exam today. The patient's skin has healed well at this time.  Plan:  Follow-up in radiation oncology in 6 months.  ____________________________________  This document serves as a record of services personally performed by Gery Pray, MD. It was created on his behalf by Bethann Humble, a trained medical scribe. The creation of this record is based on the scribe's personal observations and the provider's statements to them. This document has been checked and approved by the attending provider.

## 2016-07-21 NOTE — Progress Notes (Signed)
Meghan Padilla is here for follow up after treatment to her right breast.  She denies having pain and reports her energy level is improving.  She will start taking Arimidex tomorrow.  The skin on her right breast has hyperpigmentation with some redness on her lumpectomy scar.  BP 124/69 (BP Location: Left Arm, Patient Position: Sitting)   Pulse 87   Temp 98.4 F (36.9 C) (Oral)   Ht 5\' 1"  (1.549 m)   Wt 138 lb 9.6 oz (62.9 kg)   SpO2 100%   BMI 26.19 kg/m    Wt Readings from Last 3 Encounters:  07/21/16 138 lb 9.6 oz (62.9 kg)  06/06/16 136 lb 14.4 oz (62.1 kg)  05/31/16 135 lb (61.2 kg)

## 2016-07-25 ENCOUNTER — Telehealth: Payer: Self-pay | Admitting: Emergency Medicine

## 2016-07-25 NOTE — Telephone Encounter (Signed)
Patient called reporting that she started the Anastrozole on 12/01; states she has not taken today's dose yet. States that she has been taking the Anastrozole in the morning on an empty stomach.  States that during the weekend she experienced some shortness of breath, cough, states her chest felt jumpy and she had some nausea. Patient states that today all symptoms have resolved except for some mild shortness of breath; states not as bad as it was.   Per Dr Jana Hakim; instructed patient to restart the Anastrozole on 12/6 and to take it in the evening with supper. Advised patient to call for any concerns or changes in symptoms in the meantime. Patient verbalized understanding.

## 2016-08-02 ENCOUNTER — Telehealth: Payer: Self-pay | Admitting: Emergency Medicine

## 2016-08-02 NOTE — Telephone Encounter (Signed)
Patient called with complaints of rash under right breast; states around the area she had radiation. Noticed onset of the rash a couple days ago. States she notices the same rash in the folds of bilateral upper extremities. Complains of itching and discomfort when wearing her bra. States she has been using cortisone cream to the area. Denies any blisters to the area.   Advised patient to try Benadryl PO this evening for the itching; appointment to be set up for patient to be seen tomorrow 12/13 at 0900 with [facility] for eval. Patient verbalized understanding.

## 2016-08-03 ENCOUNTER — Ambulatory Visit (HOSPITAL_BASED_OUTPATIENT_CLINIC_OR_DEPARTMENT_OTHER): Payer: 59 | Admitting: Nurse Practitioner

## 2016-08-03 VITALS — BP 118/51 | HR 90 | Temp 98.2°F | Resp 18 | Ht 61.0 in | Wt 137.6 lb

## 2016-08-03 DIAGNOSIS — C50411 Malignant neoplasm of upper-outer quadrant of right female breast: Secondary | ICD-10-CM

## 2016-08-03 DIAGNOSIS — R21 Rash and other nonspecific skin eruption: Secondary | ICD-10-CM

## 2016-08-03 DIAGNOSIS — Z79811 Long term (current) use of aromatase inhibitors: Secondary | ICD-10-CM

## 2016-08-03 MED ORDER — NYSTATIN 100000 UNIT/GM EX POWD: Freq: Four times a day (QID) | CUTANEOUS | 1 refills | Status: DC | PRN

## 2016-08-03 MED ORDER — FLUCONAZOLE 100 MG PO TABS: 100.0000 mg | ORAL_TABLET | Freq: Every day | ORAL | 0 refills | Status: DC

## 2016-08-04 ENCOUNTER — Encounter: Payer: Self-pay | Admitting: Nurse Practitioner

## 2016-08-04 DIAGNOSIS — R21 Rash and other nonspecific skin eruption: Secondary | ICD-10-CM | POA: Insufficient documentation

## 2016-08-04 NOTE — Progress Notes (Signed)
SYMPTOM MANAGEMENT CLINIC    Chief Complaint: Rash  HPI:  Meghan Padilla 65 y.o. female diagnosed with breast cancer.  Patient is status post radiation treatments.  Currently undergoing anastrozole oral therapy.    No history exists.    Review of Systems  Skin: Positive for itching and rash.  All other systems reviewed and are negative.   Past Medical History:  Diagnosis Date  . Cancer [facility]) 2017   right breast  . Diabetes mellitus   . GERD (gastroesophageal reflux disease)   . History of radiation therapy 05/04/16-06/01/16   right breast 42.72 Gy boost to 10 Gy  . Hyperlipidemia   . Hypertension   . UTI (urinary tract infection)     Past Surgical History:  Procedure Laterality Date  . DILATION AND CURETTAGE OF UTERUS    . HAND SURGERY    . HAND SURGERY    . HYSTEROSCOPY W/D&C  11/28/2008  . RADIOACTIVE SEED GUIDED MASTECTOMY WITH AXILLARY SENTINEL LYMPH NODE BIOPSY Right 03/24/2016   Procedure: RADIOACTIVE SEED GUIDED PARTIAL MASTECTOMY WITH AXILLARY SENTINEL LYMPH NODE BIOPSY;  Surgeon: Erroll Luna, MD;  Location: [place];  Service: General;  Laterality: Right;  . TONSILLECTOMY      has Breast cancer of upper-outer quadrant of right female breast ([place]) and Rash on her problem list.    is allergic to sulfa antibiotics.    Medication List       Accurate as of 08/03/16 11:59 PM. Always use your most recent med list.          anastrozole 1 MG tablet Commonly known as:  ARIMIDEX Take 1 tablet (1 mg total) by mouth daily.   atorvastatin 40 MG tablet Commonly known as:  LIPITOR Take 40 mg by mouth daily.   fluconazole 100 MG tablet Commonly known as:  DIFLUCAN Take 1 tablet (100 mg total) by mouth daily.   metFORMIN 500 MG tablet Commonly known as:  GLUCOPHAGE Take 1,500 mg by mouth at bedtime.   nystatin powder Commonly known as:  MYCOSTATIN/NYSTOP Apply topically 4 (four) times daily as needed.   omeprazole 20 MG  capsule Commonly known as:  PRILOSEC Take 20 mg by mouth daily.   quinapril-hydrochlorothiazide 20-25 MG tablet Commonly known as:  ACCURETIC Take 1 tablet by mouth daily.   sitaGLIPtin 100 MG tablet Commonly known as:  JANUVIA Take 100 mg by mouth daily.   solifenacin 10 MG tablet Commonly known as:  VESICARE Take 10 mg by mouth daily.        PHYSICAL EXAMINATION  Oncology Vitals 08/03/2016 07/21/2016  Height 155 cm 155 cm  Weight 62.415 kg 62.869 kg  Weight (lbs) 137 lbs 10 oz 138 lbs 10 oz  BMI (kg/m2) 26 kg/m2 26.19 kg/m2  Temp 98.2 98.4  Pulse 90 87  Resp 18 -  SpO2 100 100  BSA (m2) 1.64 m2 1.65 m2   BP Readings from Last 2 Encounters:  08/03/16 (!) 118/51  07/21/16 124/69    Physical Exam  Constitutional: She is oriented to person, place, and time and well-developed, well-nourished, and in no distress.  HENT:  Head: Normocephalic and atraumatic.  Eyes: Conjunctivae and EOM are normal. Pupils are equal, round, and reactive to light.  Neck: Normal range of motion.  Pulmonary/Chest: Effort normal. No respiratory distress.  Musculoskeletal: Normal range of motion. She exhibits no edema or tenderness.  Neurological: She is alert and oriented to person, place, and time. Gait normal.  Skin: Skin is  warm and dry. Rash noted. There is erythema.  Psychiatric: Affect normal.  Nursing note and vitals reviewed.   LABORATORY DATA:. No visits with results within 3 Day(s) from this visit.  Latest known visit with results is:  Admission on 03/24/2016, Discharged on 03/24/2016  Component Date Value Ref Range Status  . Sodium 03/21/2016 140  135 - 145 mmol/L Final  . Potassium 03/21/2016 3.8  3.5 - 5.1 mmol/L Final  . Chloride 03/21/2016 103  101 - 111 mmol/L Final  . CO2 03/21/2016 29  22 - 32 mmol/L Final  . Glucose, Bld 03/21/2016 125* 65 - 99 mg/dL Final  . BUN 03/21/2016 7  6 - 20 mg/dL Final  . Creatinine, Ser 03/21/2016 0.75  0.44 - 1.00 mg/dL Final  .  Calcium 03/21/2016 9.4  8.9 - 10.3 mg/dL Final  . GFR calc non Af Amer 03/21/2016 >60  >60 mL/min Final  . GFR calc Af Amer 03/21/2016 >60  >60 mL/min Final   Comment: (NOTE) The eGFR has been calculated using the CKD EPI equation. This calculation has not been validated in all clinical situations. eGFR's persistently <60 mL/min signify possible Chronic Kidney Disease.   . Anion gap 03/21/2016 8  5 - 15 Final  . Glucose-Capillary 03/24/2016 123* 65 - 99 mg/dL Final  . Glucose-Capillary 03/24/2016 120* 65 - 99 mg/dL Final    RADIOGRAPHIC STUDIES: No results found.  ASSESSMENT/PLAN:    Rash Patient states that she begin taking anastrozole on December 1; and developed some mild, questionable shortness of breath.  She then abruptly stopped the anastrozole for a short amount of time.  She restarted the anastrozole on a daily basis on 07/27/2016.  A few days later she noticed a rash to her bilateral antecubital regions that were pruritic.  She has not tried any over-the-counter medications for treatment of the rash.  Also, patient has a much larger, chronic rash underneath her right breast that has been there for quite some time.  She states that this area is greatly pruritic as well.  Exam today reveals what appears to be a large amount of yeast rash underneath the right breast.  She has a small/trace amount underneath the left breast as well.  Bilateral antecubital regions also have rash that appears to be poison oak/poison ivy-type rash.  Advised patient that it is unclear if the rash to her arms is related to the anastrozole or not.  Patient states that she also started a new vitamin D tablet recently as well.  Patient will be prescribed Diflucan orally on a daily basis for treatment of the East-like rash underneath her breast.  She'll also be given a prescription for nystatin powder to use to the rash under her breast as well.  She was given both verbal and written instructions regarding  the use of both Benadryl and Pepcid for treatment of the rash to her bilateral arms.  She was also try.  She can use hydrocortisone cream to the rash on her arms as well.  She was also advised to hold the anastrozole for one week and see if this helps.  Advised patient would call her back next Monday, 08/08/2016 to check in with her.  She was advised to call/return or go directly to the emergency department for any worsening symptoms whatsoever.  Breast cancer of upper-outer quadrant of right female breast [facility]) Patient completed radiation treatments in October 2017.  She is currently taking anastrozole on a daily basis.  She is scheduled to  return for follow-up visit on 10/11/2016.   Patient stated understanding of all instructions; and was in agreement with this plan of care. The patient knows to call the clinic with any problems, questions or concerns.   Total time spent with patient was 25 minutes;  with greater than 75 percent of that time spent in face to face counseling regarding patient's symptoms,  and coordination of care and follow up.  Disclaimer:This dictation was prepared with Dragon/digital dictation along with [employer]. Any transcriptional errors that result from this process are unintentional.  Drue Second, NP 08/04/2016

## 2016-08-04 NOTE — Assessment & Plan Note (Signed)
Patient completed radiation treatments in October 2017.  She is currently taking anastrozole on a daily basis.  She is scheduled to return for follow-up visit on 10/11/2016.

## 2016-08-04 NOTE — Assessment & Plan Note (Signed)
Patient states that she begin taking anastrozole on December 1; and developed some mild, questionable shortness of breath.  She then abruptly stopped the anastrozole for a short amount of time.  She restarted the anastrozole on a daily basis on 07/27/2016.  A few days later she noticed a rash to her bilateral antecubital regions that were pruritic.  She has not tried any over-the-counter medications for treatment of the rash.  Also, patient has a much larger, chronic rash underneath her right breast that has been there for quite some time.  She states that this area is greatly pruritic as well.  Exam today reveals what appears to be a large amount of yeast rash underneath the right breast.  She has a small/trace amount underneath the left breast as well.  Bilateral antecubital regions also have rash that appears to be poison oak/poison ivy-type rash.  Advised patient that it is unclear if the rash to her arms is related to the anastrozole or not.  Patient states that she also started a new vitamin D tablet recently as well.  Patient will be prescribed Diflucan orally on a daily basis for treatment of the East-like rash underneath her breast.  She'll also be given a prescription for nystatin powder to use to the rash under her breast as well.  She was given both verbal and written instructions regarding the use of both Benadryl and Pepcid for treatment of the rash to her bilateral arms.  She was also try.  She can use hydrocortisone cream to the rash on her arms as well.  She was also advised to hold the anastrozole for one week and see if this helps.  Advised patient would call her back next Monday, 08/08/2016 to check in with her.  She was advised to call/return or go directly to the emergency department for any worsening symptoms whatsoever.

## 2016-08-08 ENCOUNTER — Telehealth: Payer: Self-pay | Admitting: Nurse Practitioner

## 2016-08-08 NOTE — Telephone Encounter (Signed)
Patient has been holding the anastrozole oral therapy for the last week-essentially complained of rash to her bilateral antecubital spaces with some itchiness as well.  She has been using Benadryl and Pepcid; along with hydrocortisone cream occasionally to the site.  She states that it is some better and has only minimal itchiness at this time.  Also, patient is happy to report that the apparent yeast infection underneath her breast is greatly improved since she is taken Diflucan orally and using the nystatin powder.  Reviewed all with Dr. Jana Hakim; and he recommended the patient restart the anastrozole oral therapy.  He also recommended that patient follow up with her dermatologist as well.  Patient states that she does have a dermatologist; but she has not seen him for quite some time.  She will call and try to make an appointment with a dermatologist as soon as possible.  Patient was advised call/return for any new or worsening symptoms whatsoever.

## 2016-10-04 ENCOUNTER — Other Ambulatory Visit (HOSPITAL_BASED_OUTPATIENT_CLINIC_OR_DEPARTMENT_OTHER): Payer: 59

## 2016-10-04 DIAGNOSIS — Z170 Estrogen receptor positive status [ER+]: Secondary | ICD-10-CM

## 2016-10-04 DIAGNOSIS — C50411 Malignant neoplasm of upper-outer quadrant of right female breast: Secondary | ICD-10-CM | POA: Diagnosis not present

## 2016-10-04 LAB — CBC WITH DIFFERENTIAL/PLATELET
BASO%: 1 % (ref 0.0–2.0)
BASOS ABS: 0.1 10*3/uL (ref 0.0–0.1)
EOS%: 1.6 % (ref 0.0–7.0)
Eosinophils Absolute: 0.1 10*3/uL (ref 0.0–0.5)
HEMATOCRIT: 39.4 % (ref 34.8–46.6)
HEMOGLOBIN: 12.9 g/dL (ref 11.6–15.9)
LYMPH#: 2 10*3/uL (ref 0.9–3.3)
LYMPH%: 26 % (ref 14.0–49.7)
MCH: 27.3 pg (ref 25.1–34.0)
MCHC: 32.6 g/dL (ref 31.5–36.0)
MCV: 83.7 fL (ref 79.5–101.0)
MONO#: 0.5 10*3/uL (ref 0.1–0.9)
MONO%: 6.9 % (ref 0.0–14.0)
NEUT%: 64.5 % (ref 38.4–76.8)
NEUTROS ABS: 5 10*3/uL (ref 1.5–6.5)
Platelets: 285 10*3/uL (ref 145–400)
RBC: 4.71 10*6/uL (ref 3.70–5.45)
RDW: 15.1 % — AB (ref 11.2–14.5)
WBC: 7.8 10*3/uL (ref 3.9–10.3)

## 2016-10-04 LAB — COMPREHENSIVE METABOLIC PANEL
ALBUMIN: 3.9 g/dL (ref 3.5–5.0)
ALK PHOS: 87 U/L (ref 40–150)
ALT: 27 U/L (ref 0–55)
AST: 26 U/L (ref 5–34)
Anion Gap: 11 mEq/L (ref 3–11)
BILIRUBIN TOTAL: 0.37 mg/dL (ref 0.20–1.20)
BUN: 7.8 mg/dL (ref 7.0–26.0)
CALCIUM: 9.4 mg/dL (ref 8.4–10.4)
CO2: 26 mEq/L (ref 22–29)
CREATININE: 0.8 mg/dL (ref 0.6–1.1)
Chloride: 104 mEq/L (ref 98–109)
EGFR: 77 mL/min/{1.73_m2} — ABNORMAL LOW (ref >90)
GLUCOSE: 111 mg/dL (ref 70–140)
POTASSIUM: 3.2 meq/L — AB (ref 3.5–5.1)
SODIUM: 141 meq/L (ref 136–145)
TOTAL PROTEIN: 6.8 g/dL (ref 6.4–8.3)

## 2016-10-05 LAB — VITAMIN D 25 HYDROXY (VIT D DEFICIENCY, FRACTURES): Vitamin D, 25-Hydroxy: 23.3 ng/mL — ABNORMAL LOW (ref 30.0–100.0)

## 2016-10-11 ENCOUNTER — Ambulatory Visit (HOSPITAL_BASED_OUTPATIENT_CLINIC_OR_DEPARTMENT_OTHER): Payer: 59 | Admitting: Oncology

## 2016-10-11 VITALS — BP 118/57 | HR 105 | Temp 98.2°F | Resp 18 | Wt 136.2 lb

## 2016-10-11 DIAGNOSIS — E559 Vitamin D deficiency, unspecified: Secondary | ICD-10-CM

## 2016-10-11 DIAGNOSIS — C50411 Malignant neoplasm of upper-outer quadrant of right female breast: Secondary | ICD-10-CM

## 2016-10-11 DIAGNOSIS — Z79811 Long term (current) use of aromatase inhibitors: Secondary | ICD-10-CM

## 2016-10-11 DIAGNOSIS — Z170 Estrogen receptor positive status [ER+]: Secondary | ICD-10-CM | POA: Diagnosis not present

## 2016-10-11 NOTE — Progress Notes (Signed)
[place]  Telephone:(336) [phone] [phone]     ID: Meghan Padilla DOB: 08-10-1951  MR#: [id number]  FIE#:[id number]  Patient Care Team: [name], MD as PCP - General (Family Medicine) Chauncey Cruel, MD as Consulting Physician (Oncology) Erroll Luna, MD as Consulting Physician (General Surgery) Gery Pray, MD as Consulting Physician (Radiation Oncology) Arvella Nigh, MD (Obstetrics and Gynecology) Franchot Gallo, MD as Consulting Physician (Urology) Wilford Corner, MD as Consulting Physician (Gastroenterology) OTHER MD:  CHIEF COMPLAINT: Estrogen receptor positive breast cancer  CURRENT TREATMENT: Anastrozole   BREAST CANCER HISTORY: From the original intake note:  Meghan Padilla routine [facility] showing a possible right breast distortion and or mass and possibly a right axillary lymph node. She was recalled for diagnostic right mammography with tomography and ultrasonography at the [facility] 02/24/2016. This found her right breast density to be category C. In the upper right breast there was a site of distortion. The mass of concern was consistent with an intramamm [name]. On exam there was no palpable abnormality. Ultrasonography found a hypoechoic irregular mass with spiculated margins at the 12:00 position of the right breast measuring 0.5 cm. Ultrasound of the right axilla showed multiple normal-appearing lymph nodes.  Biopsy of the right breast mass in question 02/29/2016 showed orifices [id number]) and invasive ductal carcinoma, grade 2, estrogen receptor 95% positive, with strong staining intensity, progesterone receptor 2% positive, with strong staining intensity, with an MIB-1 of 10%, and no HER-2 amplification, the signals ratio being 1.48 and the number per cell 2.30.  Her subsequent history is as detailed below.  INTERVAL HISTORY: Meghan Padilla started anastrozole  07/22/2017. Immediately after starting she developed a little nausea and shortness of breath. The shortness of breath resolved on its own and she took care of the nausea by taking the pill at bedtime. In about 2 weeks later she developed a rash under her right breast and on her left arm. She came to see [place]. The rash under the right breast was clearly a yeast infection. She received Nizoral powder and Diflucan and that cleared. The rash on the left arm was found to be more like eczema by her dermatologist and that also has clear.  She is now taking anastrozole with no significant side effects. There has been a minimal increase in her hot flashes. There are no vaginal dryness problems that bother her. She is obtaining the drug at approximately $4 a month.  REVIEW OF SYSTEMS: She is very active, doing a lot of cleaning of her own home and also her mother's home, but she does not otherwise exercise regularly. A detailed review of systems today was benign except as noted, appears well  PAST MEDICAL HISTORY: Past Medical History:  Diagnosis Date  . Cancer [facility]) 2017   right breast  . Diabetes mellitus   . GERD (gastroesophageal reflux disease)   . History of radiation therapy 05/04/16-06/01/16   right breast 42.72 Gy boost to 10 Gy  . Hyperlipidemia   . Hypertension   . UTI (urinary tract infection)     PAST SURGICAL HISTORY: Past Surgical History:  Procedure Laterality Date  . DILATION AND CURETTAGE OF UTERUS    . HAND SURGERY    . HAND SURGERY    . HYSTEROSCOPY W/D&C  11/28/2008  . RADIOACTIVE SEED GUIDED MASTECTOMY WITH AXILLARY SENTINEL LYMPH NODE BIOPSY Right 03/24/2016   Procedure: RADIOACTIVE SEED GUIDED PARTIAL MASTECTOMY WITH AXILLARY SENTINEL LYMPH  NODE BIOPSY;  Surgeon: Erroll Luna, MD;  Location: [place];  Service: General;  Laterality: Right;  . TONSILLECTOMY      FAMILY HISTORY Family History  Problem Relation Age of Onset  . Cancer Neg Hx    The  patient's parents are still living, in their late 40s. The patient had no brothers, 2 sisters. There is no history of breast or ovarian or any other cancer in the family to the patient's knowledge   GYNECOLOGIC HISTORY:  No LMP recorded. Patient is postmenopausal.  menarche age 46, first live birth age 58. The patient went through menopause in her early 57s. She did not take hormone replacement. 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 Her husband Meghan Padilla was a Dealer at [employer]. He is retired and disabled after multiple back surgeries. Daughter Rhilyn Padilla lives in [place] and is disabled secondary to cognitive and behavioral issues. Son Meghan Padilla lives in [place] where he does car painting and body work. The patient has no grandchildren. She is a Tourist information centre manager.    ADVANCED DIRECTIVES: Not in place    HEALTH MAINTENANCE: Social History  Substance Use Topics  . Smoking status: Never Smoker  . Smokeless tobacco: Never Used  . Alcohol use No     Colonoscopy:  PAP:  Bone density:   Allergies  Allergen Reactions  . Sulfa Antibiotics Nausea Only    Current Outpatient Prescriptions  Medication Sig Dispense Refill  . anastrozole (ARIMIDEX) 1 MG tablet Take 1 tablet (1 mg total) by mouth daily. 90 tablet 4  . atorvastatin (LIPITOR) 40 MG tablet Take 40 mg by mouth daily.    . fluconazole (DIFLUCAN) 100 MG tablet Take 1 tablet (100 mg total) by mouth daily. 14 tablet 0  . metFORMIN (GLUCOPHAGE) 500 MG tablet Take 1,500 mg by mouth at bedtime.     Marland Kitchen nystatin (MYCOSTATIN/NYSTOP) powder Apply topically 4 (four) times daily as needed. 15 g 1  . omeprazole (PRILOSEC) 20 MG capsule Take 20 mg by mouth daily.    . quinapril-hydrochlorothiazide (ACCURETIC) 20-25 MG per tablet Take 1 tablet by mouth daily.    . sitaGLIPtin (JANUVIA) 100 MG tablet  Take 100 mg by mouth daily.    . solifenacin (VESICARE) 10 MG tablet Take 10 mg by mouth daily.     No current facility-administered medications for this visit.     OBJECTIVE: Middle-aged white woman Who appears well Vitals:   10/11/16 1417  BP: (!) 118/57  Pulse: (!) 105  Resp: 18  Temp: 98.2 F (36.8 C)     Body mass index is 25.73 kg/m.    ECOG FS:0 - Asymptomatic  Sclerae unicteric, EOMs intact Oropharynx clear and moist No cervical or supraclavicular adenopathy Lungs no rales or rhonchi Heart regular rate and rhythm Abd soft, nontender, positive bowel sounds MSK no focal spinal tenderness, no upper extremity lymphedema Neuro: nonfocal, well oriented, appropriate affect Breasts: The right breast is status post lumpectomy and radiation with no evidence of local recurrence. The right axilla is benign. The left breast is unremarkable.    LAB RESULTS:  CMP     Component Value Date/Time   NA 141 10/04/2016 1329   K 3.2 (L) 10/04/2016 1329   CL 103 03/21/2016 1459   CO2 26 10/04/2016 1329   GLUCOSE 111 10/04/2016 1329   BUN  7.8 10/04/2016 1329   CREATININE 0.8 10/04/2016 1329   CALCIUM 9.4 10/04/2016 1329   PROT 6.8 10/04/2016 1329   ALBUMIN 3.9 10/04/2016 1329   AST 26 10/04/2016 1329   ALT 27 10/04/2016 1329   ALKPHOS 87 10/04/2016 1329   BILITOT 0.37 10/04/2016 1329   GFRNONAA >60 03/21/2016 1459   GFRAA >60 03/21/2016 1459    INo results found for: SPEP, UPEP  Lab Results  Component Value Date   WBC 7.8 10/04/2016   NEUTROABS 5.0 10/04/2016   HGB 12.9 10/04/2016   HCT 39.4 10/04/2016   MCV 83.7 10/04/2016   PLT 285 10/04/2016      Chemistry      Component Value Date/Time   NA 141 10/04/2016 1329   K 3.2 (L) 10/04/2016 1329   CL 103 03/21/2016 1459   CO2 26 10/04/2016 1329   BUN 7.8 10/04/2016 1329   CREATININE 0.8 10/04/2016 1329      Component Value Date/Time   CALCIUM 9.4 10/04/2016 1329   ALKPHOS 87 10/04/2016 1329   AST 26 10/04/2016  1329   ALT 27 10/04/2016 1329   BILITOT 0.37 10/04/2016 1329       No results found for: LABCA2  No components found for: LABCA125  No results for input(s): INR in the last 168 hours.  Urinalysis    Component Value Date/Time   COLORURINE YELLOW 01/28/2012 1952   APPEARANCEUR CLEAR 01/28/2012 1952   LABSPEC 1.006 01/28/2012 1952   PHURINE 7.0 01/28/2012 1952   GLUCOSEU NEGATIVE 01/28/2012 1952   HGBUR NEGATIVE 01/28/2012 1952   BILIRUBINUR NEGATIVE 01/28/2012 1952   KETONESUR NEGATIVE 01/28/2012 1952   PROTEINUR NEGATIVE 01/28/2012 1952   UROBILINOGEN 0.2 01/28/2012 1952   NITRITE NEGATIVE 01/28/2012 1952   LEUKOCYTESUR TRACE (A) 01/28/2012 1952     STUDIES: No results found.  ELIGIBLE FOR AVAILABLE RESEARCH PROTOCOL: no  ASSESSMENT: 66 y.o. [place] woman status post right breast upper outer quadrant biopsy 02/29/2016 for a clinical T1a N0, stage IA invasive ductal carcinoma, grade 2, estrogen receptor positive, progesterone receptor positive at 2%, HER-2 nonamplified, with an MIB-1 of 10%  (1) breast conserving 03/24/2016 confirmed a pT1c pN0, stage IA invasive lobular carcinoma, grade 2, with negative margins  (2) Oncotype DX score of 16 ("low risk" "), predicts a risk of outside the breast recurrence within 10 years of 10% if the patient's only systemic therapy is tamoxifen for 5 years. It also predicts no benefit from adjuvant chemotherapy.  (3) adjuvant radiation 05/04/16-06/01/16:  52.72 Gy to the right breast+boost  (4) started anastrozole 07/22/2016  (a) Vitamin D level 23.3 on 10/04/2016  (b) bone density pending  PLAN: Laquisha is tolerating the anastrozole well and the plan will be to continue that for a total of 5 years.  We discussed her low vitamin D level. She has not been taking her vitamin D supplementation regularly but she well from this point. She also needs to start a walking regimen. We discussed that at length.  It would be helpful to have a  baseline bone density. She believes she is due for this and she plans to obtain it through Dr. Lisbeth Ply office when she sees him this spring.  Otherwise she will see me again late August, after her next mammogram. If everything is going well at that time I will start seeing her on a once a year basis until she completes her 5 years of follow-up    Steele Ledonne C, MD   10/11/2016 2:18  PM Medical Oncology and Hematology [facility] [address] [place], [place] [address] Tel. [phone]    Fax. [phone]

## 2016-10-19 ENCOUNTER — Telehealth: Payer: Self-pay | Admitting: *Deleted

## 2016-10-19 NOTE — Telephone Encounter (Signed)
"  I saw Dr. Jana Hakim 10-11-2016.  Was told my potassium level has been low several times previously and to take potassium.  Will something be called in, do I take something over the counter?  Please call me so I know what to do [phone]." Uses [facility] at [address].

## 2016-10-20 ENCOUNTER — Other Ambulatory Visit: Payer: Self-pay | Admitting: Emergency Medicine

## 2016-10-20 MED ORDER — POTASSIUM CHLORIDE ER 10 MEQ PO TBCR: 20.0000 meq | EXTENDED_RELEASE_TABLET | Freq: Every day | ORAL | 0 refills | Status: DC

## 2016-10-30 IMAGING — US US ABDOMEN COMPLETE
1 series · 14 of 25 positions shown · non-contrast
Comparison: No prior Gy

CLINICAL DATA: Right upper quadrant pain.

EXAM:
ABDOMEN ULTRASOUND COMPLETE

[Series 1: us abdomen complete · 0.19mm/px · 14 of 69 slices shown]
[im 1/69]
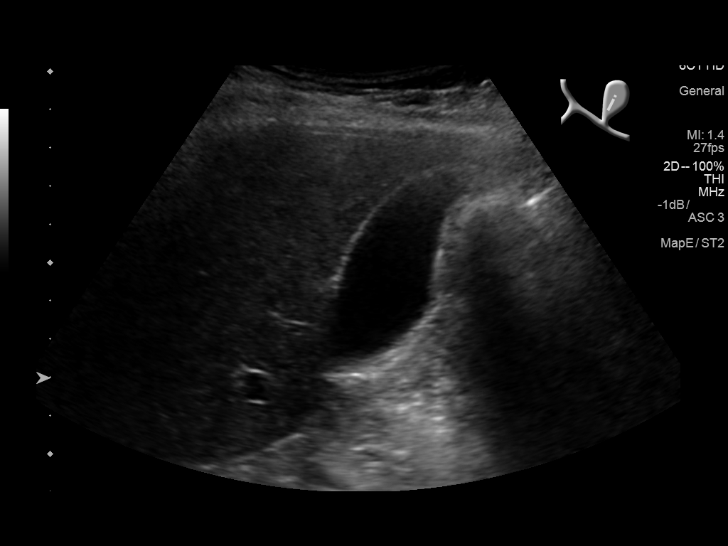
[im 6/69]
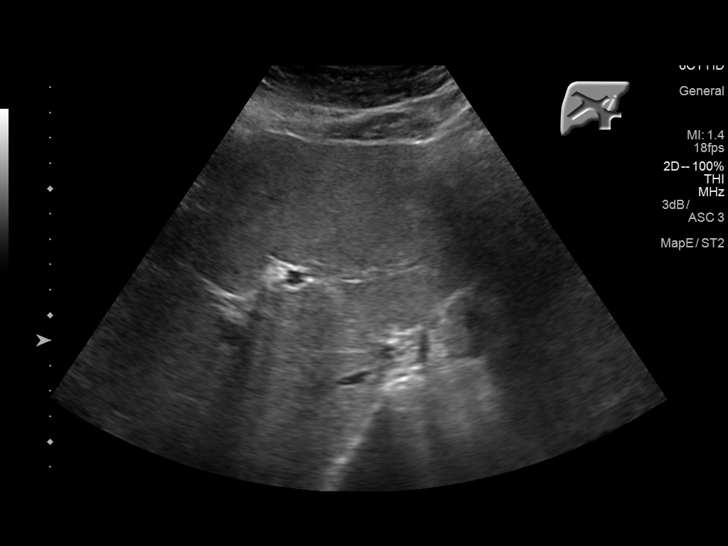
[im 12/69]
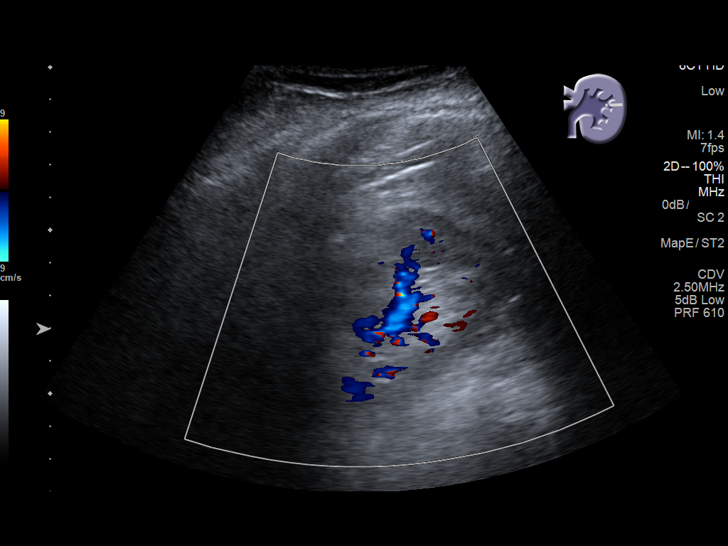
[im 18/69]
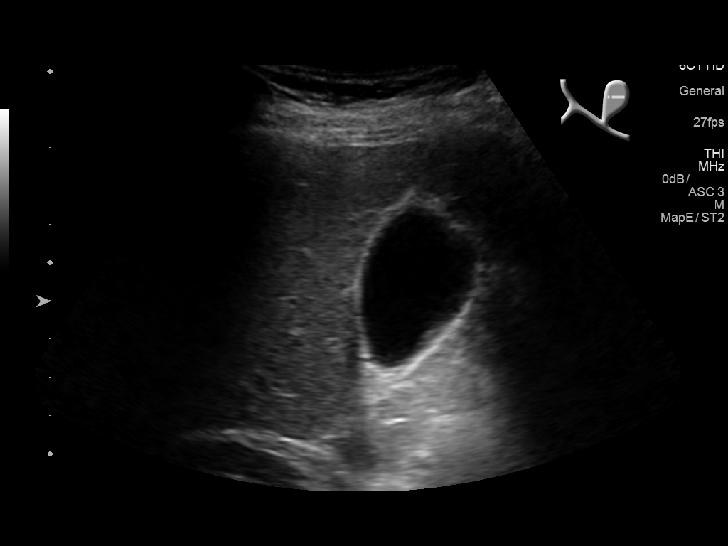
[im 23/69]
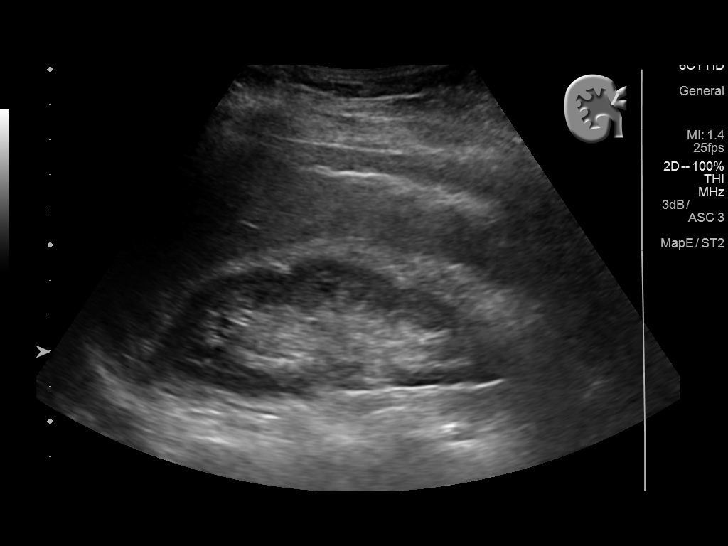
[im 26/69]
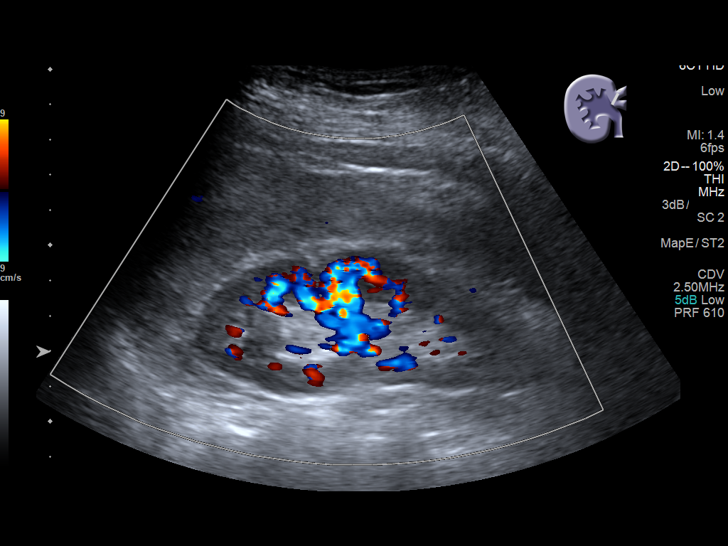
[im 32/69]
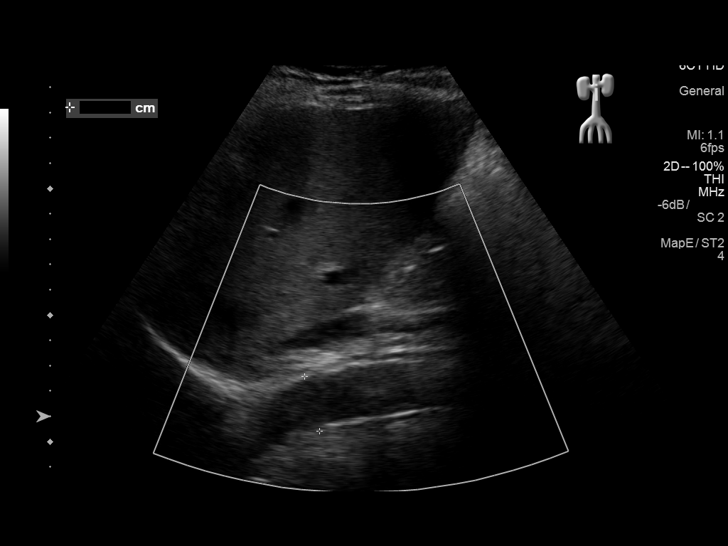
[im 37/69]
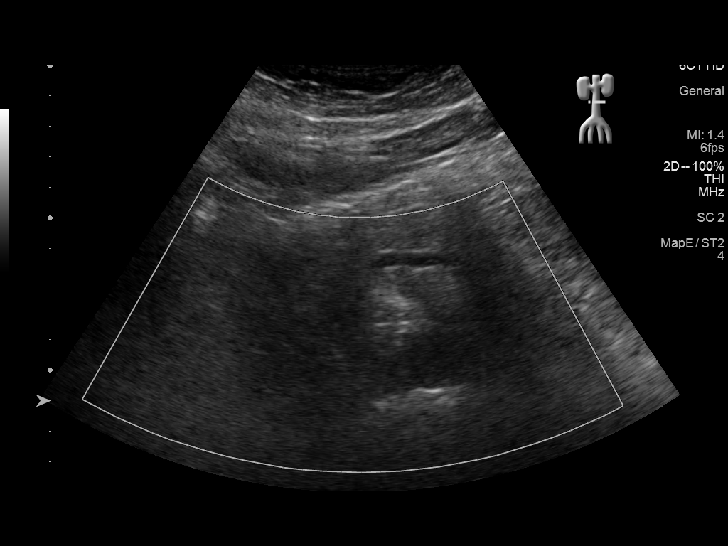
[im 43/69]
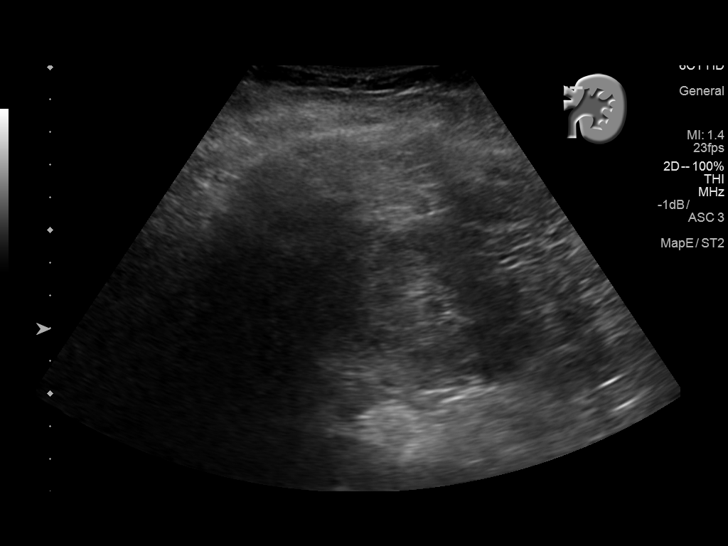
[im 46/69]
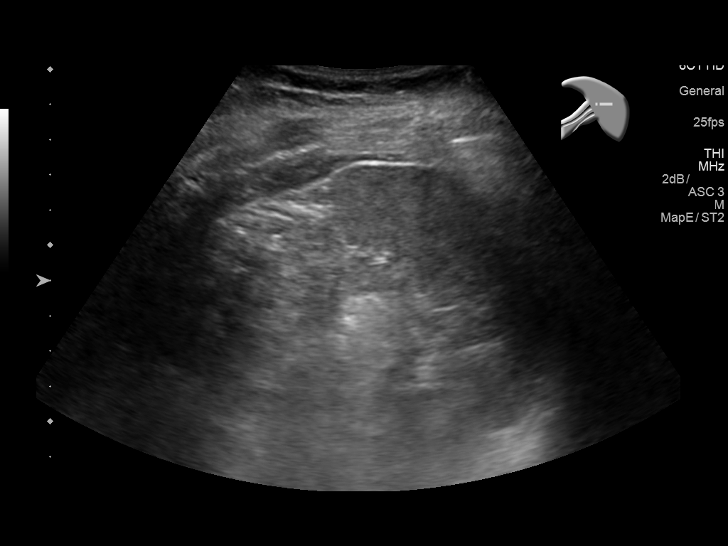
[im 52/69]
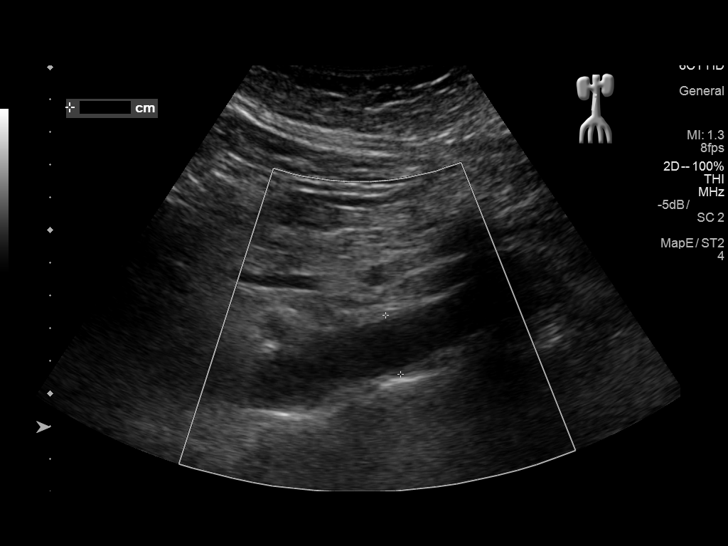
[im 57/69]
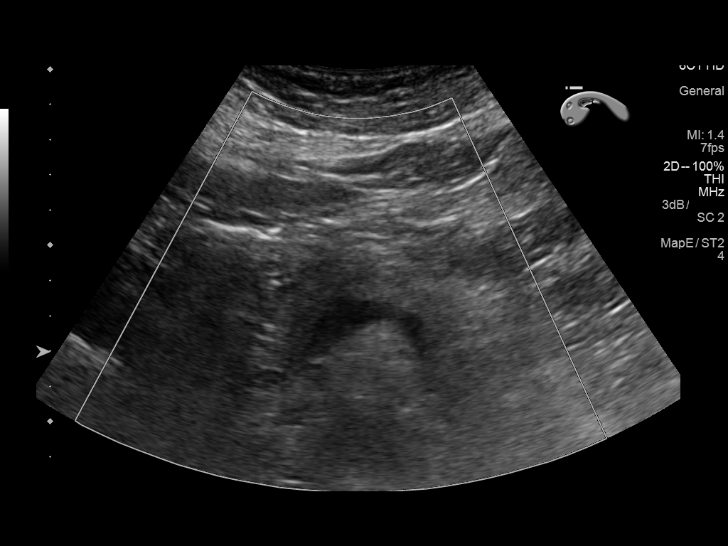
[im 63/69]
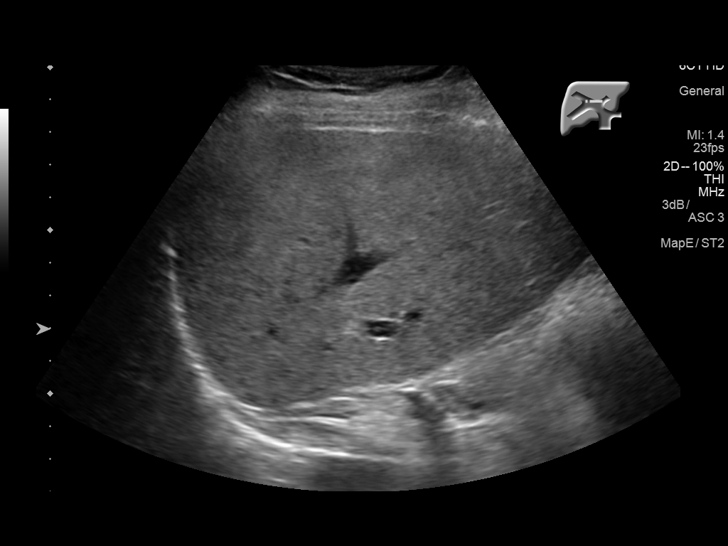
[im 69/69]
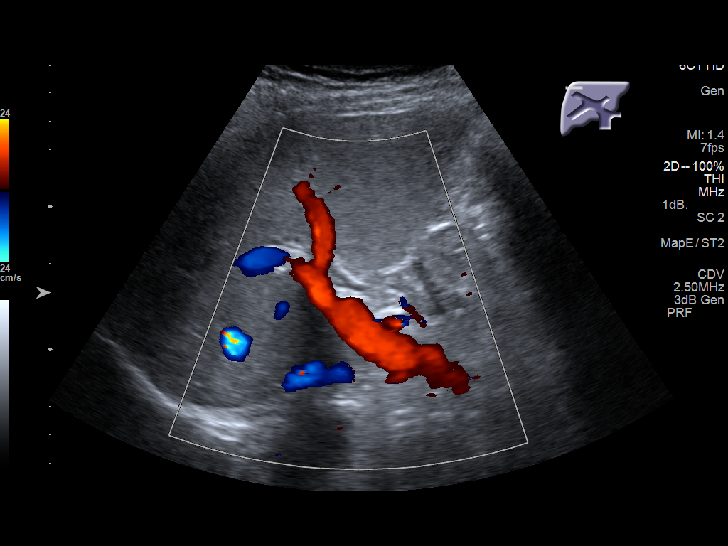

[14 of 25 positions shown; findings below may reference images not displayed]

FINDINGS: Gallbladder: No gallstones or wall thickening visualized. No
sonographic Murphy sign noted by sonographer.

Common bile duct: Diameter: 3.3 mm

Liver: No focal lesion identified. Within normal limits in
parenchymal echogenicity.

IVC: No abnormality visualized.

Pancreas: Visualized portion unremarkable.

Spleen: Size and appearance within normal limits.

Right Kidney: Length: 10.1 cm. Echogenicity within normal limits. No
mass or hydronephrosis visualized.

Left Kidney: Length: 11.1 cm. Echogenicity within normal limits. No
mass or hydronephrosis visualized.

Abdominal aorta: No aneurysm visualized.

Other findings: None.
IMPRESSION: No acute or focal abnormality identified.

## 2016-11-07 ENCOUNTER — Telehealth: Payer: Self-pay | Admitting: Adult Health

## 2016-11-07 NOTE — Telephone Encounter (Signed)
Called and husband [name] answered. He is aware of new appt date and time and will let Patient know. Reminder letter sent out.

## 2016-11-20 DIAGNOSIS — I10 Essential (primary) hypertension: Secondary | ICD-10-CM | POA: Insufficient documentation

## 2016-11-20 DIAGNOSIS — K219 Gastro-esophageal reflux disease without esophagitis: Secondary | ICD-10-CM | POA: Insufficient documentation

## 2016-11-20 DIAGNOSIS — E785 Hyperlipidemia, unspecified: Secondary | ICD-10-CM | POA: Insufficient documentation

## 2016-12-08 ENCOUNTER — Ambulatory Visit (HOSPITAL_BASED_OUTPATIENT_CLINIC_OR_DEPARTMENT_OTHER): Payer: 59 | Admitting: Adult Health

## 2016-12-08 VITALS — BP 136/84 | HR 81 | Temp 98.0°F | Resp 20 | Wt 136.0 lb

## 2016-12-08 DIAGNOSIS — Z170 Estrogen receptor positive status [ER+]: Secondary | ICD-10-CM

## 2016-12-08 DIAGNOSIS — C50411 Malignant neoplasm of upper-outer quadrant of right female breast: Secondary | ICD-10-CM | POA: Diagnosis not present

## 2016-12-08 DIAGNOSIS — Z79811 Long term (current) use of aromatase inhibitors: Secondary | ICD-10-CM | POA: Diagnosis not present

## 2016-12-08 DIAGNOSIS — E2839 Other primary ovarian failure: Secondary | ICD-10-CM

## 2016-12-08 NOTE — Progress Notes (Signed)
CLINIC:  Survivorship   REASON FOR VISIT:  Routine follow-up post-treatment for a recent history of breast cancer.  BRIEF ONCOLOGIC HISTORY:    Breast cancer of upper-outer quadrant of right female breast ([place])   03/17/2016 Initial Diagnosis    Breast cancer of upper-outer quadrant of right female breast ([place])     03/24/2016 Surgery    Right lumpectomy ([place]): ILC, grade 2, 1.3 cm, LCIS, margins negative, 3 SLN negative, ER+(95%), PR+(2%), Ki-67 10%, Her2 negative      03/24/2016 Oncotype testing    Oncotype score: 16, ROR 10%.       05/04/2016 - 06/01/2016 Radiation Therapy    Adjuvant Radiation ([place]): 1) Right Breast: 42.72 Gy in 16 fractions.  2) Right Breast Boost: 10 Gy in 5 fractions      07/22/2016 -  Anti-estrogen oral therapy    Anastrozole daily, 5 years of therapy planned.         INTERVAL HISTORY:  Meghan Padilla presents to the Survivorship Clinic today for our initial meeting to review her survivorship care plan detailing her treatment course for breast cancer, as well as monitoring long-term side effects of that treatment, education regarding health maintenance, screening, and overall wellness and health promotion.     Overall, Meghan Padilla reports feeling quite well.  She is taking anastrozole daily and is feeling well.  She is taking it at night and without much more difficutly.  Her nausea from the medication has subsided.  She denies hot flashes, arthralgias, vaginal dryness or further concerns.      REVIEW OF SYSTEMS:  Review of Systems  Constitutional: Negative for chills, diaphoresis, fever, malaise/fatigue and weight loss.  HENT: Negative for hearing loss and tinnitus.   Eyes: Negative for blurred vision and double vision.  Respiratory: Negative for cough and shortness of breath.   Cardiovascular: Negative for chest pain and palpitations.  Gastrointestinal: Negative for abdominal pain, blood in stool, constipation, diarrhea, heartburn, melena, nausea and  vomiting.  Genitourinary: Negative for dysuria and urgency.  Musculoskeletal: Negative for back pain, joint pain and myalgias.  Skin: Negative for rash.  Neurological: Negative for dizziness, tingling, tremors, sensory change, weakness and headaches.  Endo/Heme/Allergies: Negative for environmental allergies. Does not bruise/bleed easily.  Breast: Denies any new nodularity, masses, tenderness, nipple changes, or nipple discharge.       ONCOLOGY TREATMENT TEAM:  1. Surgeon:  Dr. Brantley Stage at [facility] 2. Medical Oncologist: Dr. Jana Hakim 3. Radiation Oncologist: Dr. Sondra Come    PAST MEDICAL/SURGICAL HISTORY:  Past Medical History:  Diagnosis Date  . Cancer [facility]) 2017   right breast  . Diabetes mellitus   . GERD (gastroesophageal reflux disease)   . History of radiation therapy 05/04/16-06/01/16   right breast 42.72 Gy boost to 10 Gy  . Hyperlipidemia   . Hypertension   . UTI (urinary tract infection)    Past Surgical History:  Procedure Laterality Date  . DILATION AND CURETTAGE OF UTERUS    . HAND SURGERY    . HAND SURGERY    . HYSTEROSCOPY W/D&C  11/28/2008  . RADIOACTIVE SEED GUIDED MASTECTOMY WITH AXILLARY SENTINEL LYMPH NODE BIOPSY Right 03/24/2016   Procedure: RADIOACTIVE SEED GUIDED PARTIAL MASTECTOMY WITH AXILLARY SENTINEL LYMPH NODE BIOPSY;  Surgeon: Erroll Luna, MD;  Location: [facility];  Service: General;  Laterality: Right;  . TONSILLECTOMY       ALLERGIES:  Allergies  Allergen Reactions  . Sulfa Antibiotics Nausea Only     CURRENT  MEDICATIONS:  Outpatient Encounter Prescriptions as of 12/08/2016  Medication Sig Note  . anastrozole (ARIMIDEX) 1 MG tablet Take 1 tablet (1 mg total) by mouth daily. 08/03/2016: Restarted on December 6  . atorvastatin (LIPITOR) 40 MG tablet Take 40 mg by mouth daily.   . metFORMIN (GLUCOPHAGE) 500 MG tablet Take 1,500 mg by mouth at bedtime.    Marland Kitchen omeprazole (PRILOSEC) 20 MG capsule Take 20 mg by  mouth daily.   . potassium chloride (K-DUR) 10 MEQ tablet Take 2 tablets (20 mEq total) by mouth daily.   . quinapril-hydrochlorothiazide (ACCURETIC) 20-25 MG per tablet Take 1 tablet by mouth daily.   . sitaGLIPtin (JANUVIA) 100 MG tablet Take 100 mg by mouth daily.   . solifenacin (VESICARE) 10 MG tablet Take 10 mg by mouth daily.   . [DISCONTINUED] fluconazole (DIFLUCAN) 100 MG tablet Take 1 tablet (100 mg total) by mouth daily.   . [DISCONTINUED] nystatin (MYCOSTATIN/NYSTOP) powder Apply topically 4 (four) times daily as needed.    No facility-administered encounter medications on file as of 12/08/2016.      ONCOLOGIC FAMILY HISTORY:  Family History  Problem Relation Age of Onset  . Cancer Neg Hx      GENETIC COUNSELING/TESTING: none  SOCIAL HISTORY:  Meghan Padilla is married and lives in [place], [place].  She has 2 children and they live in [place] and [place].  Meghan Padilla is currently retired.  She denies any current or history of tobacco, alcohol, or illicit drug use.     PHYSICAL EXAMINATION:  Vital Signs:   Vitals:   12/08/16 1201  BP: 136/84  Pulse: 81  Resp: 20  Temp: 98 F (36.7 C)   Filed Weights   12/08/16 1201  Weight: 136 lb (61.7 kg)   General: Well-nourished, well-appearing female in no acute distress.  She is unaccompanied today.   HEENT: Head is normocephalic.  Pupils equal and reactive to light. Conjunctivae clear without exudate.  Sclerae anicteric. Oral mucosa is pink, moist.  Oropharynx is pink without lesions or erythema.  Lymph: No cervical, supraclavicular, or infraclavicular lymphadenopathy noted on palpation.  Cardiovascular: Regular rate and rhythm.Marland Kitchen Respiratory: Clear to auscultation bilaterally. Chest expansion symmetric; breathing non-labored.  GI: Abdomen soft and round; non-tender, non-distended. Bowel sounds normoactive.  GU: Deferred.  Neuro: No focal deficits. Steady gait.  Psych: Mood and affect normal and appropriate  for situation.  Extremities: No edema. Skin: Warm and dry.  LABORATORY DATA:  None for this visit.  DIAGNOSTIC IMAGING:  None for this visit.      ASSESSMENT AND PLAN:  Meghan Padilla is a pleasant 66 y.o. female with Stage IA right breast invasive lobular carcinoma, ER+/PR+/HER2-, diagnosed in (date), treated with lumpectomy, adjuvant radiation therapy, and anti-estrogen therapy with Anastrozole beginning in 07/2016.  She presents to the Survivorship Clinic for our initial meeting and routine follow-up post-completion of treatment for breast cancer.    1. Stage IA right breast cancer:  Meghan Padilla is continuing to recover from definitive treatment for breast cancer. She will follow-up with her medical oncologist, Dr. Jana Hakim in August, 2018 with history and physical exam per surveillance protocol.  She will continue her anti-estrogen therapy with Anastrozole. Thus far, she is tolerating the medication well, with minimal side effects. Today, a comprehensive survivorship care plan and treatment summary was reviewed with the patient today detailing her breast cancer diagnosis, treatment course, potential late/long-term effects of treatment, appropriate follow-up care with recommendations for the future, and patient education  resources.  A copy of this summary, along with a letter will be sent to the patient's primary care provider via mail/fax/In Basket message after today's visit.    2. Bone health:  Given Meghan Padilla age/history of breast cancer and her current treatment regimen including anti-estrogen therapy with Anastrozole, she is at risk for bone demineralization.    Her last DEXA was in 2015 (per Dr. Sherran Needs office), so she is due for another one in light of starting Anastrozole.  I have requested this be done at the breast center at the same time as her mammogram.  In the meantime, she was encouraged to increase her consumption of foods rich in calcium, as well as increase her weight-bearing  activities.  She was given education on specific activities to promote bone health.  3. Cancer screening:  Due to Meghan Padilla's history and her age, she should receive screening for skin cancers, colon cancer, and gynecologic cancers.  The information and recommendations are listed on the patient's comprehensive care plan/treatment summary and were reviewed in detail with the patient.    4. Health maintenance and wellness promotion: Meghan Padilla was encouraged to consume 5-7 servings of fruits and vegetables per day. We reviewed the "Nutrition Rainbow" handout, as well as the handout "Take Control of Your Health and Reduce Your Cancer Risk" from the [place].  She was also encouraged to engage in moderate to vigorous exercise for 30 minutes per day most days of the week. We discussed the [employer] fitness program, which is designed for cancer survivors to help them become more physically fit after cancer treatments.  She was instructed to limit her alcohol consumption and continue to abstain from tobacco use.     5. Support services/counseling: It is not uncommon for this period of the patient's cancer care trajectory to be one of many emotions and stressors.  We discussed an opportunity for her to participate in the next session of [facility] ("Finding Your New Normal") support group series designed for patients after they have completed treatment.   Meghan Padilla was encouraged to take advantage of our many other support services programs, support groups, and/or counseling in coping with her new life as a cancer survivor after completing anti-cancer treatment.  She was offered support today through active listening and expressive supportive counseling.  She was given information regarding our available services and encouraged to contact me with any questions or for help enrolling in any of our support group/programs.    Dispo:   -Return to cancer center in August, 2018 for follow up with Dr.  Jana Hakim -Mammogram and bone density in July, 2018.   -She is welcome to return back to the Survivorship Clinic at any time; no additional follow-up needed at this time.  -Consider referral back to survivorship as a long-term survivor for continued surveillance  A total of (30) minutes of face-to-face time was spent with this patient with greater than 50% of that time in counseling and care-coordination.   Gardenia Phlegm, NP Survivorship Program [facility] [phone]   Note: PRIMARY CARE PROVIDER Osborne Casco, [place] [phone]

## 2016-12-09 ENCOUNTER — Encounter: Payer: Self-pay | Admitting: Adult Health

## 2017-01-19 ENCOUNTER — Encounter: Payer: Self-pay | Admitting: Radiation Oncology

## 2017-01-19 ENCOUNTER — Ambulatory Visit
Admission: RE | Admit: 2017-01-19 | Discharge: 2017-01-19 | Disposition: A | Discharge status: Home / Self Care | Payer: 59 | Source: Ambulatory Visit | Attending: Radiation Oncology | Admitting: Radiation Oncology

## 2017-01-19 DIAGNOSIS — Z170 Estrogen receptor positive status [ER+]: Secondary | ICD-10-CM | POA: Insufficient documentation

## 2017-01-19 DIAGNOSIS — C50411 Malignant neoplasm of upper-outer quadrant of right female breast: Secondary | ICD-10-CM | POA: Diagnosis not present

## 2017-01-19 NOTE — Progress Notes (Signed)
  Radiation Oncology         ([phone]) [phone] ________________________________  Name: Meghan Padilla MRN: [id number]  Date: 01/19/2017  DOB: [birth year]  Follow-Up Visit Note  CC: [name], MD  Erroll Luna, MD    ICD-9-CM ICD-10-CM   1. Malignant neoplasm of upper-outer quadrant of right breast in female, estrogen receptor positive ([place]) 174.4 C50.411    V86.0 Z17.0     Diagnosis:   Stage IA (pT1c, pN0) invasive lobular carcinoma of the right breast (ER/PR +, HER2 -)     Interval Since Last Radiation:  8 months 05/04/16-06/01/16 52.72 Gy to the right breast+boost  Narrative:  The patient returns today for routine follow-up. Patient denies having pain and reports a good energy level.. Patient denies nipple discharge/ bleeding, or issues with lymphedema.    Tolerating anastrozole well.                   ALLERGIES:  is allergic to sulfa antibiotics.  Meds: Current Outpatient Prescriptions  Medication Sig Dispense Refill  . anastrozole (ARIMIDEX) 1 MG tablet Take 1 tablet (1 mg total) by mouth daily. 90 tablet 4  . atorvastatin (LIPITOR) 40 MG tablet Take 40 mg by mouth daily.    . metFORMIN (GLUCOPHAGE) 500 MG tablet Take 1,500 mg by mouth at bedtime.     Marland Kitchen omeprazole (PRILOSEC) 20 MG capsule Take 20 mg by mouth daily.    . quinapril-hydrochlorothiazide (ACCURETIC) 20-25 MG per tablet Take 1 tablet by mouth daily. Now taking 12.5 tablete.    . sitaGLIPtin (JANUVIA) 100 MG tablet Take 100 mg by mouth daily.    . solifenacin (VESICARE) 10 MG tablet Take 10 mg by mouth daily.    . potassium chloride (K-DUR) 10 MEQ tablet Take 2 tablets (20 mEq total) by mouth daily. (Patient not taking: Reported on 01/19/2017) 20 tablet 0   No current facility-administered medications for this encounter.     Physical Findings: The patient is in no acute distress. Patient is alert and oriented.  height is '5\' 1"'$  (1.549 m) and weight is 135 lb (61.2 kg). Her oral temperature is 98.4 F (36.9 C).  Her blood pressure is 142/70 (abnormal) and her pulse is 81. Her oxygen saturation is 99%. .  No significant changes. Lungs are clear to auscultation bilaterally. Heart has regular rate and rhythm. No palpable cervical, supraclavicular, or axillary adenopathy. Abdomen soft, non-tender, normal bowel sounds. Mild hyperpigmentation noted in the treatment area on the right breast. No palpable mass or nipple discharge in bilateral breasts.  Lab Findings: Lab Results  Component Value Date   WBC 7.8 10/04/2016   HGB 12.9 10/04/2016   HCT 39.4 10/04/2016   MCV 83.7 10/04/2016   PLT 285 10/04/2016    Radiographic Findings: No results found.  Impression:  No evidence of recurrence on clinical exam today.  Plan:  Follow-up in radiation oncology prn. Patient will remain under close follow-up with Dr. Jana Hakim and remain on Arimidex.  ____________________________________  This document serves as a record of services personally performed by Gery Pray, MD. It was created on his behalf by Bethann Humble, a trained medical scribe. The creation of this record is based on the scribe's personal observations and the provider's statements to them. This document has been checked and approved by the attending provider.

## 2017-01-19 NOTE — Progress Notes (Signed)
Meghan Padilla is here for follow up after treatment to her right breast.  She denies having pain.  She is taking Arimidex.  She reports having a good energy level.  The skin on her right breast has slight hyperpigmentation.  BP (!) 142/70 (BP Location: Left Arm, Patient Position: Sitting)   Pulse 81   Temp 98.4 F (36.9 C) (Oral)   Ht 5\' 1"  (1.549 m)   Wt 135 lb (61.2 kg)   SpO2 99%   BMI 25.51 kg/m    Wt Readings from Last 3 Encounters:  01/19/17 135 lb (61.2 kg)  12/08/16 136 lb (61.7 kg)  10/11/16 136 lb 3.2 oz (61.8 kg)

## 2017-02-15 ENCOUNTER — Other Ambulatory Visit (HOSPITAL_COMMUNITY): Payer: Self-pay | Admitting: Obstetrics and Gynecology

## 2017-02-15 DIAGNOSIS — R1011 Right upper quadrant pain: Secondary | ICD-10-CM

## 2017-02-20 ENCOUNTER — Ambulatory Visit (HOSPITAL_COMMUNITY)
Admission: RE | Admit: 2017-02-20 | Discharge: 2017-02-20 | Disposition: A | Discharge status: Home / Self Care | Payer: 59 | Source: Ambulatory Visit | Attending: Obstetrics and Gynecology | Admitting: Obstetrics and Gynecology

## 2017-02-20 DIAGNOSIS — R1011 Right upper quadrant pain: Secondary | ICD-10-CM | POA: Diagnosis present

## 2017-02-27 ENCOUNTER — Ambulatory Visit
Admission: RE | Admit: 2017-02-27 | Discharge: 2017-02-27 | Disposition: A | Discharge status: Home / Self Care | Payer: 59 | Source: Ambulatory Visit | Attending: Adult Health | Admitting: Adult Health

## 2017-02-27 DIAGNOSIS — Z170 Estrogen receptor positive status [ER+]: Secondary | ICD-10-CM

## 2017-02-27 DIAGNOSIS — C50411 Malignant neoplasm of upper-outer quadrant of right female breast: Secondary | ICD-10-CM

## 2017-02-27 HISTORY — DX: Personal history of irradiation: Z92.3

## 2017-02-27 HISTORY — DX: Malignant neoplasm of unspecified site of unspecified female breast: C50.919

## 2017-02-27 IMAGING — MG 2D DIGITAL DIAGNOSTIC BILATERAL MAMMOGRAM WITH CAD AND ADJUNCT T
9 of 16 series · 9 of 36 positions shown · non-contrast
Comparison: Previous exam(s).

CLINICAL DATA: 65-year-old female with history of right lumpectomy
in [DATE] demonstrating invasive lobular carcinoma and lobular
carcinoma in situ. Margins were negative. Sentinel lymph node biopsy
was negative.

EXAM:
2D DIGITAL DIAGNOSTIC BILATERAL MAMMOGRAM WITH CAD AND ADJUNCT TOMO

[R MLO (1 of 3)]
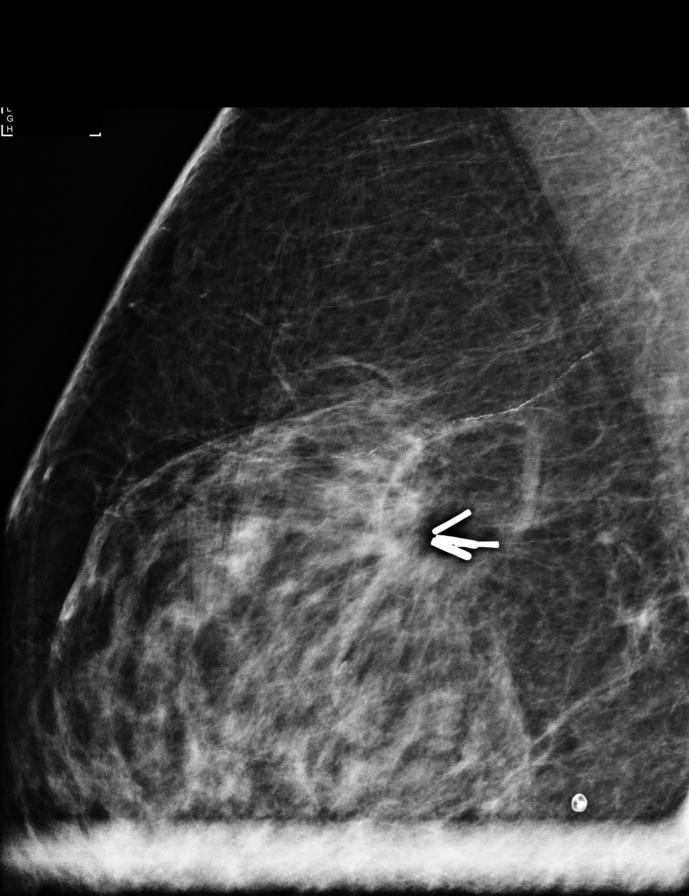

[R MLO (2 of 3)]
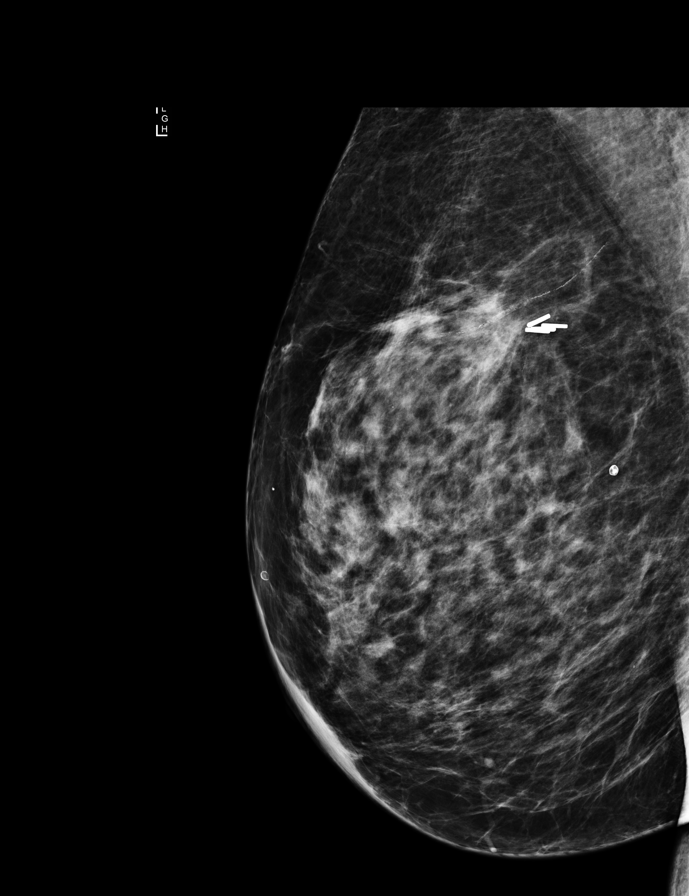

[R CC synth-2D]
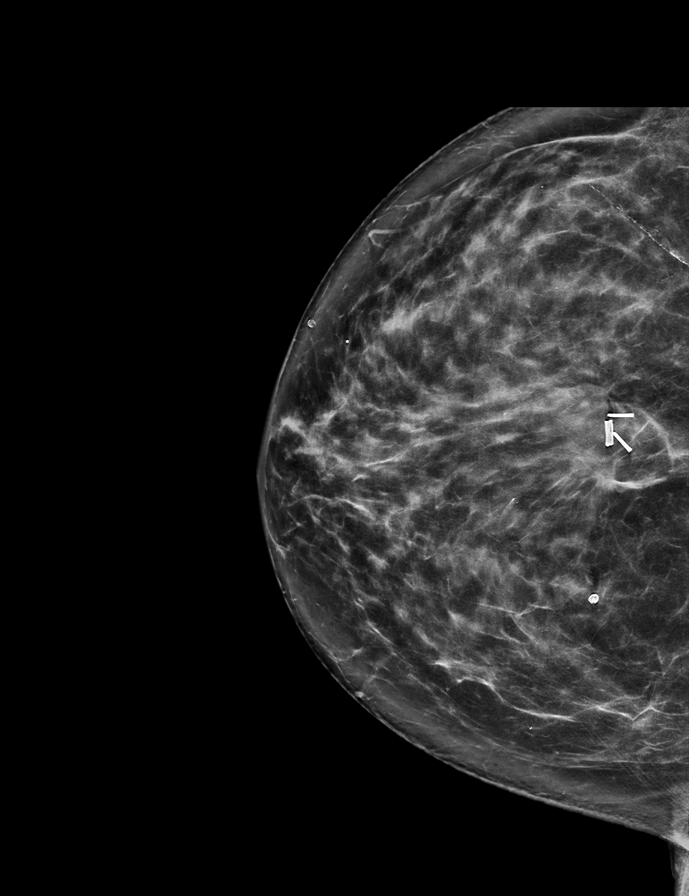

[L CC]
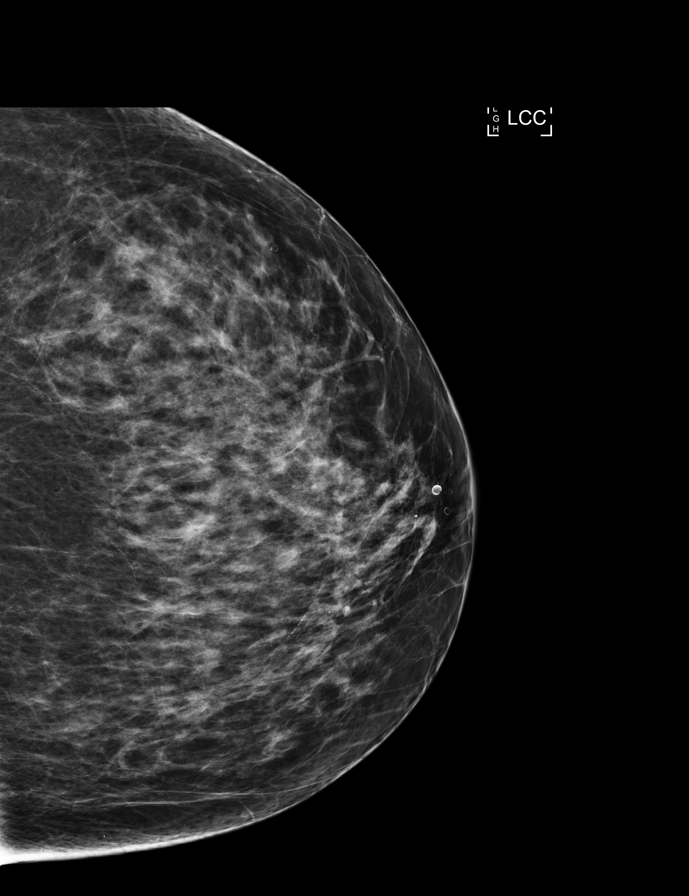

[R MLO (3 of 3)]
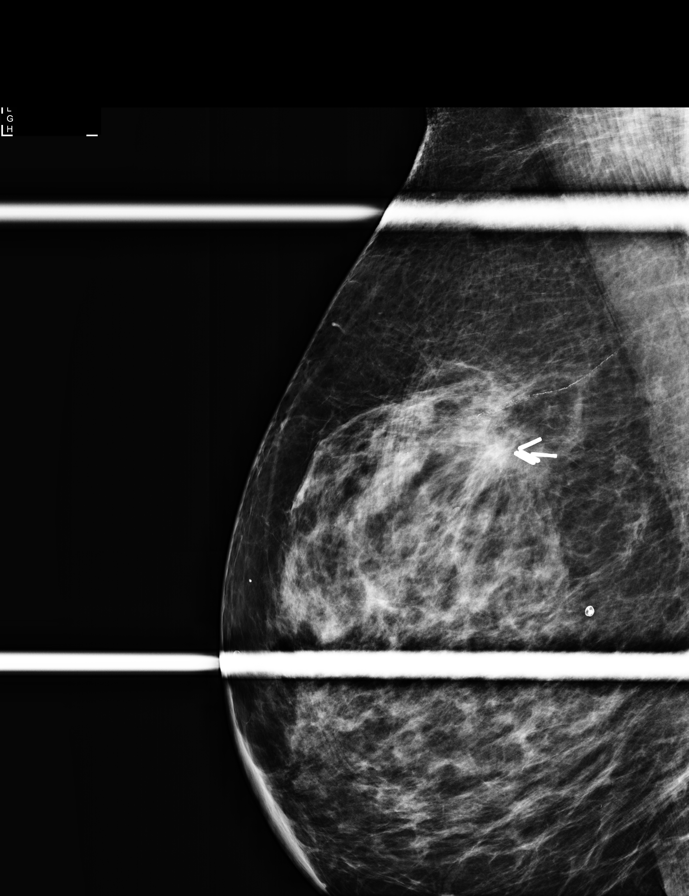

[L MLO]
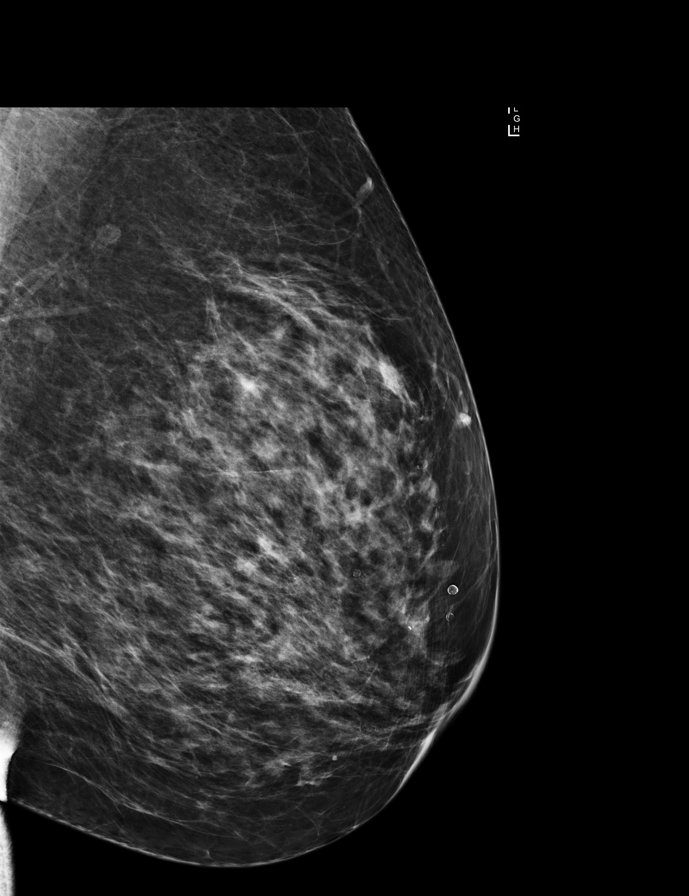

[R MLO synth-2D]
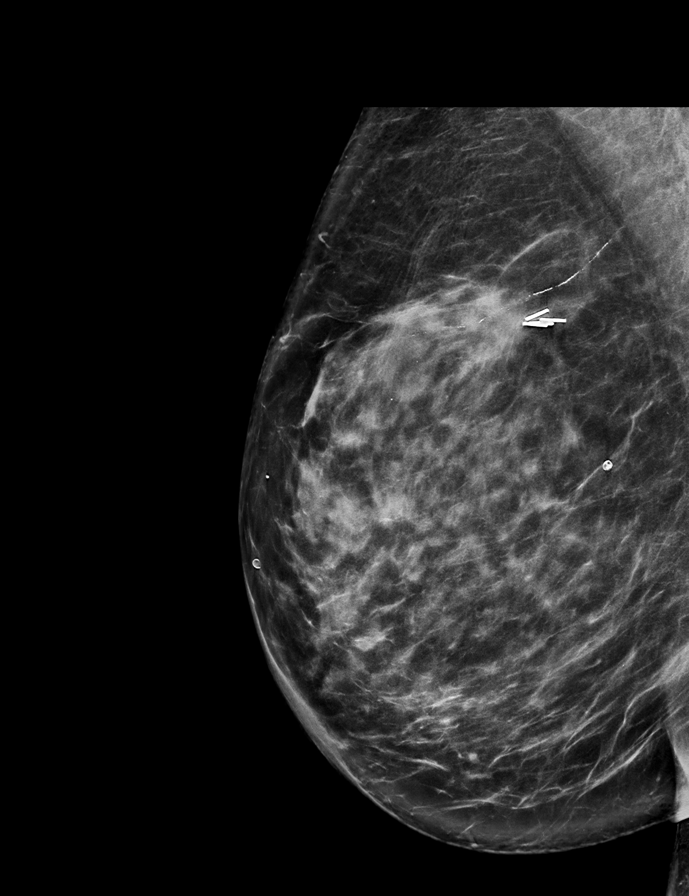

[L CC synth-2D]
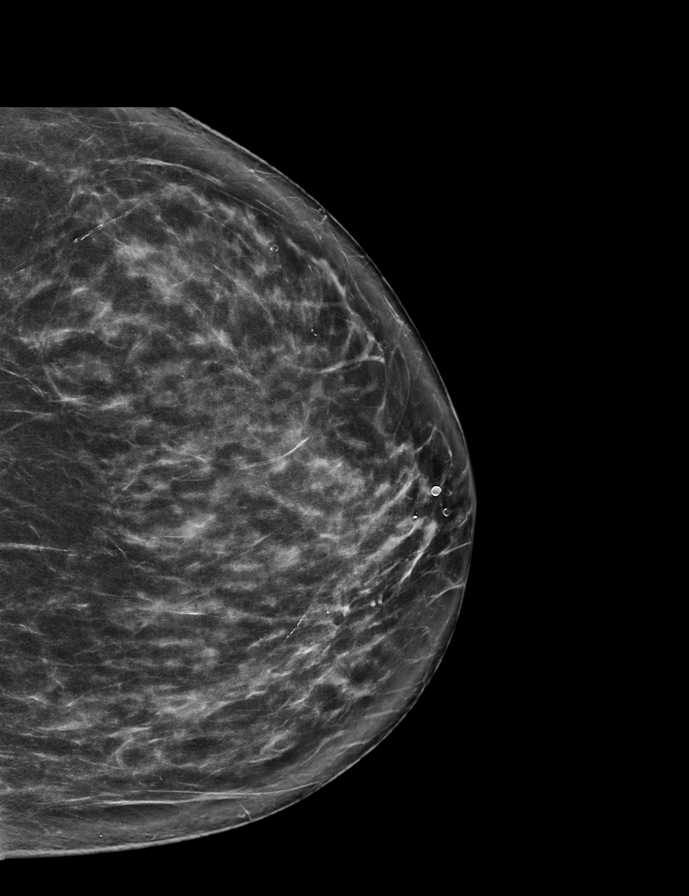

[L MLO synth-2D]
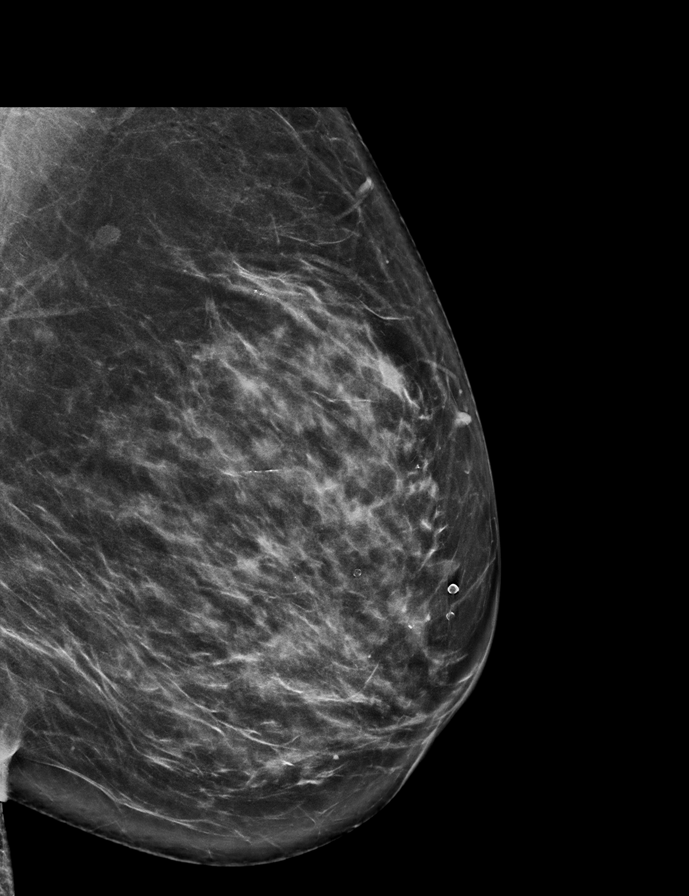

[9 of 36 positions shown; findings below may reference images not displayed]

ACR Breast Density Category c: The breast tissue is heterogeneously
dense, which may obscure small masses.
FINDINGS: New posttreatment changes are seen of the right breast. No
suspicious mass, calcifications, or other abnormality is identified
within either breast.

Mammographic images were processed with CAD.
IMPRESSION: No mammographic evidence of malignancy.

RECOMMENDATION:
Bilateral diagnostic mammogram in 1 year.

I have discussed the findings and recommendations with the patient.
Results were also provided in writing at the conclusion of the
visit. If applicable, a reminder letter will be sent to the patient
regarding the next appointment.

BI-RADS CATEGORY  2: Benign.

## 2017-04-10 ENCOUNTER — Ambulatory Visit (HOSPITAL_BASED_OUTPATIENT_CLINIC_OR_DEPARTMENT_OTHER): Payer: 59 | Admitting: Oncology

## 2017-04-10 ENCOUNTER — Other Ambulatory Visit (HOSPITAL_BASED_OUTPATIENT_CLINIC_OR_DEPARTMENT_OTHER): Payer: 59

## 2017-04-10 VITALS — BP 145/62 | HR 106 | Temp 98.0°F | Resp 18 | Ht 61.0 in | Wt 132.9 lb

## 2017-04-10 DIAGNOSIS — C50411 Malignant neoplasm of upper-outer quadrant of right female breast: Secondary | ICD-10-CM | POA: Diagnosis not present

## 2017-04-10 DIAGNOSIS — E559 Vitamin D deficiency, unspecified: Secondary | ICD-10-CM

## 2017-04-10 DIAGNOSIS — Z170 Estrogen receptor positive status [ER+]: Secondary | ICD-10-CM

## 2017-04-10 DIAGNOSIS — E2839 Other primary ovarian failure: Secondary | ICD-10-CM

## 2017-04-10 DIAGNOSIS — Z79811 Long term (current) use of aromatase inhibitors: Secondary | ICD-10-CM

## 2017-04-10 LAB — COMPREHENSIVE METABOLIC PANEL
ALT: 20 U/L (ref 0–55)
AST: 22 U/L (ref 5–34)
Albumin: 3.6 g/dL (ref 3.5–5.0)
Alkaline Phosphatase: 78 U/L (ref 40–150)
Anion Gap: 7 mEq/L (ref 3–11)
BUN: 7.3 mg/dL (ref 7.0–26.0)
CHLORIDE: 104 meq/L (ref 98–109)
CO2: 31 mEq/L — ABNORMAL HIGH (ref 22–29)
CREATININE: 0.8 mg/dL (ref 0.6–1.1)
Calcium: 9.4 mg/dL (ref 8.4–10.4)
EGFR: 79 mL/min/{1.73_m2} — ABNORMAL LOW (ref >90)
GLUCOSE: 174 mg/dL — AB (ref 70–140)
POTASSIUM: 3.5 meq/L (ref 3.5–5.1)
SODIUM: 142 meq/L (ref 136–145)
Total Bilirubin: 0.25 mg/dL (ref 0.20–1.20)
Total Protein: 6.4 g/dL (ref 6.4–8.3)

## 2017-04-10 LAB — CBC WITH DIFFERENTIAL/PLATELET
BASO%: 0.8 % (ref 0.0–2.0)
BASOS ABS: 0.1 10*3/uL (ref 0.0–0.1)
EOS%: 13 % — AB (ref 0.0–7.0)
Eosinophils Absolute: 1.1 10*3/uL — ABNORMAL HIGH (ref 0.0–0.5)
HCT: 37.9 % (ref 34.8–46.6)
HGB: 12 g/dL (ref 11.6–15.9)
LYMPH%: 22.7 % (ref 14.0–49.7)
MCH: 27.1 pg (ref 25.1–34.0)
MCHC: 31.7 g/dL (ref 31.5–36.0)
MCV: 85.7 fL (ref 79.5–101.0)
MONO#: 0.6 10*3/uL (ref 0.1–0.9)
MONO%: 7.1 % (ref 0.0–14.0)
NEUT#: 4.8 10*3/uL (ref 1.5–6.5)
NEUT%: 56.4 % (ref 38.4–76.8)
Platelets: 226 10*3/uL (ref 145–400)
RBC: 4.42 10*6/uL (ref 3.70–5.45)
RDW: 15 % — AB (ref 11.2–14.5)
WBC: 8.4 10*3/uL (ref 3.9–10.3)
lymph#: 1.9 10*3/uL (ref 0.9–3.3)

## 2017-04-10 MED ORDER — VITAMIN D 1000 UNITS PO TABS: 1000.0000 [IU] | ORAL_TABLET | Freq: Every day | ORAL | 4 refills | Status: DC

## 2017-04-10 MED ORDER — ANASTROZOLE 1 MG PO TABS: 1.0000 mg | ORAL_TABLET | Freq: Every day | ORAL | 4 refills | Status: DC

## 2017-04-10 NOTE — Progress Notes (Signed)
[place]  [phone] [phone]     ID: Meghan Padilla DOB: [birth year]  MR#: [id number]  EXH#:[id number]  Patient Care Team: [name], MD as PCP - General (Family Medicine) Magrinat, [name], MD as Consulting Physician (Oncology) Erroll Luna, MD as Consulting Physician (General Surgery) Gery Pray, MD as Consulting Physician (Radiation Oncology) Arvella Nigh, MD (Obstetrics and Gynecology) Franchot Gallo, MD as Consulting Physician (Urology) Wilford Corner, MD as Consulting Physician (Gastroenterology) Delice Bison, [name], NP as Nurse Practitioner (Hematology and Oncology) OTHER MD:  CHIEF COMPLAINT: Estrogen receptor positive breast cancer  CURRENT TREATMENT: Anastrozole   BREAST CANCER HISTORY: From the original intake note:  Meghan Padilla routine [facility] showing a possible right breast distortion and or mass and possibly a right axillary lymph node. She was recalled for diagnostic right mammography with tomography and ultrasonography at the [facility] 02/24/2016. This found her right breast density to be category C. In the upper right breast there was a site of distortion. The mass of concern was consistent with an intramamm [name]. On exam there was no palpable abnormality. Ultrasonography found a hypoechoic irregular mass with spiculated margins at the 12:00 position of the right breast measuring 0.5 cm. Ultrasound of the right axilla showed multiple normal-appearing lymph nodes.  Biopsy of the right breast mass in question 02/29/2016 showed orifices [id number]) and invasive ductal carcinoma, grade 2, estrogen receptor 95% positive, with strong staining intensity, progesterone receptor 2% positive, with strong staining intensity, with an MIB-1 of 10%, and no HER-2 amplification, the signals ratio being 1.48 and the number per cell 2.30.  Her  subsequent history is as detailed below.  INTERVAL HISTORY: Meghan Padilla returns today for follow-up and treatment of her estrogen receptor positive breast cancer. Interval history is unremarkable. She continues on anastrozole, with good tolerance.  Hot flashes and vaginal dryness are not a major issue. She never developed the arthralgias or myalgias that many patients can experience on this medication. She obtains it at a good price.  REVIEW OF SYSTEMS: She does all her housework and yard work but does not otherwise exercise regularly. She travels to see her daughter in [place] (in the group home close) at least a once a month and most often twice. She tells me her blood sugars are well controlled. The niece was having fairly awful bladder spasms. These are slightly better controlled on the tolterodine she just started A detailed review of systems today was otherwise stable  PAST MEDICAL HISTORY: Past Medical History:  Diagnosis Date  . Breast cancer ([place])   . Cancer [facility]) 2017   right breast  . Diabetes mellitus   . GERD (gastroesophageal reflux disease)   . History of radiation therapy 05/04/16-06/01/16   right breast 42.72 Gy boost to 10 Gy  . Hyperlipidemia   . Hypertension   . Personal history of radiation therapy   . UTI (urinary tract infection)     PAST SURGICAL HISTORY: Past Surgical History:  Procedure Laterality Date  . BREAST BIOPSY    . BREAST LUMPECTOMY     right 03/2016  . DILATION AND CURETTAGE OF UTERUS    . HAND SURGERY    . HAND SURGERY    . HYSTEROSCOPY W/D&C  11/28/2008  . RADIOACTIVE SEED GUIDED MASTECTOMY WITH AXILLARY SENTINEL LYMPH NODE BIOPSY Right 03/24/2016   Procedure: RADIOACTIVE SEED GUIDED PARTIAL MASTECTOMY WITH AXILLARY SENTINEL LYMPH NODE BIOPSY;  Surgeon: Erroll Luna,  MD;  Location: [place];  Service: General;  Laterality: Right;  . TONSILLECTOMY      FAMILY HISTORY Family History  Problem Relation Age of Onset  .  Cancer Neg Hx    The patient's parents are still living, in their late 89s. The patient had no brothers, 2 sisters. There is no history of breast or ovarian or any other cancer in the family to the patient's knowledge   GYNECOLOGIC HISTORY:  No LMP recorded. Patient is postmenopausal.  menarche age 35, first live birth age 48. The patient went through menopause in her early 34s. She did not take hormone replacement. 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 Her husband Meghan Padilla was a Dealer at [employer]. He is retired and disabled after multiple back surgeries. Daughter Meghan Padilla lives in [place] and is disabled secondary to cognitive and behavioral issues. Son Meghan Padilla lives in [place] where he does car painting and body work. The patient has no grandchildren. She is a Tourist information centre manager.    ADVANCED DIRECTIVES: Not in place    HEALTH MAINTENANCE: Social History  Substance Use Topics  . Smoking status: Never Smoker  . Smokeless tobacco: Never Used  . Alcohol use No     Colonoscopy:  PAP:  Bone density:   Allergies  Allergen Reactions  . Sulfa Antibiotics Nausea Only    Current Outpatient Prescriptions  Medication Sig Dispense Refill  . quinapril (ACCUPRIL) 20 MG tablet Take 20 mg by mouth at bedtime.    . tolterodine (DETROL LA) 4 MG 24 hr capsule Take 4 mg by mouth daily.    Marland Kitchen anastrozole (ARIMIDEX) 1 MG tablet Take 1 tablet (1 mg total) by mouth daily. 90 tablet 4  . atorvastatin (LIPITOR) 40 MG tablet Take 40 mg by mouth daily.    . cholecalciferol (VITAMIN D) 1000 units tablet Take 1 tablet (1,000 Units total) by mouth daily. 100 tablet 4  . metFORMIN (GLUCOPHAGE) 500 MG tablet Take 1,500 mg by mouth at bedtime.     Marland Kitchen omeprazole (PRILOSEC) 20 MG capsule Take 20 mg by mouth daily.    . sitaGLIPtin (JANUVIA) 100 MG tablet Take  100 mg by mouth daily.    . solifenacin (VESICARE) 10 MG tablet Take 10 mg by mouth daily.     No current facility-administered medications for this visit.     OBJECTIVE: Middle-aged white woman In no acute distress  Vitals:   04/10/17 1341  BP: (!) 145/62  Pulse: (!) 106  Resp: 18  Temp: 98 F (36.7 C)  SpO2: 99%     Body mass index is 25.11 kg/m.    ECOG FS:0 - Asymptomatic  Sclerae unicteric, pupils round and equal Oropharynx clear and moist No cervical or supraclavicular adenopathy Lungs no rales or rhonchi Heart regular rate and rhythm Abd soft, nontender, positive bowel sounds MSK no focal spinal tenderness, no upper extremity lymphedema Neuro: nonfocal, well oriented, appropriate affect Breasts: The right breast has undergone lumpectomy followed by radiation, with no evidence of recurrence. Left breast is benign. Both axillae are benign.   LAB RESULTS:  CMP     Component Value Date/Time   NA 141 10/04/2016 1329   K 3.2 (L) 10/04/2016 1329   CL 103 03/21/2016 1459   CO2 26 10/04/2016 1329   GLUCOSE 111 10/04/2016 1329   BUN  7.8 10/04/2016 1329   CREATININE 0.8 10/04/2016 1329   CALCIUM 9.4 10/04/2016 1329   PROT 6.8 10/04/2016 1329   ALBUMIN 3.9 10/04/2016 1329   AST 26 10/04/2016 1329   ALT 27 10/04/2016 1329   ALKPHOS 87 10/04/2016 1329   BILITOT 0.37 10/04/2016 1329   GFRNONAA >60 03/21/2016 1459   GFRAA >60 03/21/2016 1459    INo results found for: SPEP, UPEP  Lab Results  Component Value Date   WBC 8.4 04/10/2017   NEUTROABS 4.8 04/10/2017   HGB 12.0 04/10/2017   HCT 37.9 04/10/2017   MCV 85.7 04/10/2017   PLT 226 04/10/2017      Chemistry      Component Value Date/Time   NA 141 10/04/2016 1329   K 3.2 (L) 10/04/2016 1329   CL 103 03/21/2016 1459   CO2 26 10/04/2016 1329   BUN 7.8 10/04/2016 1329   CREATININE 0.8 10/04/2016 1329      Component Value Date/Time   CALCIUM 9.4 10/04/2016 1329   ALKPHOS 87 10/04/2016 1329   AST 26  10/04/2016 1329   ALT 27 10/04/2016 1329   BILITOT 0.37 10/04/2016 1329       No results found for: LABCA2  No components found for: LABCA125  No results for input(s): INR in the last 168 hours.  Urinalysis    Component Value Date/Time   COLORURINE YELLOW 01/28/2012 1952   APPEARANCEUR CLEAR 01/28/2012 1952   LABSPEC 1.006 01/28/2012 1952   PHURINE 7.0 01/28/2012 1952   GLUCOSEU NEGATIVE 01/28/2012 1952   HGBUR NEGATIVE 01/28/2012 1952   BILIRUBINUR NEGATIVE 01/28/2012 [place] NEGATIVE 01/28/2012 1952   PROTEINUR NEGATIVE 01/28/2012 1952   UROBILINOGEN 0.2 01/28/2012 1952   NITRITE NEGATIVE 01/28/2012 1952   LEUKOCYTESUR TRACE (A) 01/28/2012 1952     STUDIES: Bilateral diagnostic mammography with tomography at the [facility] 02/27/2017 found the breast density to be category C. There was no evidence of malignancy.  ELIGIBLE FOR AVAILABLE RESEARCH PROTOCOL: no  ASSESSMENT: 66 y.o. [place] woman status post right breast upper outer quadrant biopsy 02/29/2016 for a clinical T1a N0, stage IA invasive ductal carcinoma, grade 2, estrogen receptor positive, progesterone receptor positive at 2%, HER-2 nonamplified, with an MIB-1 of 10%  (1) breast conserving 03/24/2016 confirmed a pT1c pN0, stage IA invasive lobular carcinoma, grade 2, with negative margins  (2) Oncotype DX score of 16 ("low risk" "), predicts a risk of outside the breast recurrence within 10 years of 10% if the patient's only systemic therapy is tamoxifen for 5 years. It also predicts no benefit from adjuvant chemotherapy.  (3) adjuvant radiation 05/04/16-06/01/16:  52.72 Gy to the right breast+boost  (4) started anastrozole 07/22/2016  (a) Vitamin D level 23.3 on 10/04/2016  (b) bone density pending  PLAN: Yomara is now a year out from definitive surgery for her breast cancer with no evidence of disease recurrence. This is favorable.  She is tolerating her anastrozole well. The plan is to  continue that for a total of 5 years.  Her vitamin D level was low last time we checked it. It was rechecked today. I am starting her on vitamin D supplementation and I encouraged her to start a walking program.  I am adding an order for bone density to be done with her next mammogram, August of next year.  She sees her primary physician January and July every year, she sees her gynecologist also in July, and she will see her surgeon Dr. Brantley Stage  later this year.  Accordingly were going to start seeing her in April on a yearly basis until she completes her 5 years of follow-up    Chauncey Cruel, MD   04/10/2017 2:02 PM Medical Oncology and Hematology [facility] [place], [place] [address] Tel. [phone]    Fax. [phone]

## 2017-12-05 ENCOUNTER — Telehealth: Payer: Self-pay | Admitting: Oncology

## 2017-12-05 ENCOUNTER — Inpatient Hospital Stay: Payer: 59

## 2017-12-05 ENCOUNTER — Inpatient Hospital Stay: Payer: 59 | Attending: Adult Health | Admitting: Adult Health

## 2017-12-05 VITALS — BP 158/69 | HR 90 | Temp 98.1°F | Resp 18 | Ht 61.0 in | Wt 133.1 lb

## 2017-12-05 DIAGNOSIS — N898 Other specified noninflammatory disorders of vagina: Secondary | ICD-10-CM | POA: Diagnosis not present

## 2017-12-05 DIAGNOSIS — I10 Essential (primary) hypertension: Secondary | ICD-10-CM | POA: Diagnosis not present

## 2017-12-05 DIAGNOSIS — N951 Menopausal and female climacteric states: Secondary | ICD-10-CM

## 2017-12-05 DIAGNOSIS — Z79811 Long term (current) use of aromatase inhibitors: Secondary | ICD-10-CM

## 2017-12-05 DIAGNOSIS — N6459 Other signs and symptoms in breast: Secondary | ICD-10-CM | POA: Diagnosis not present

## 2017-12-05 DIAGNOSIS — E2839 Other primary ovarian failure: Secondary | ICD-10-CM

## 2017-12-05 DIAGNOSIS — Z170 Estrogen receptor positive status [ER+]: Secondary | ICD-10-CM

## 2017-12-05 DIAGNOSIS — C50411 Malignant neoplasm of upper-outer quadrant of right female breast: Secondary | ICD-10-CM | POA: Insufficient documentation

## 2017-12-05 DIAGNOSIS — N6312 Unspecified lump in the right breast, upper inner quadrant: Secondary | ICD-10-CM

## 2017-12-05 LAB — CBC WITH DIFFERENTIAL/PLATELET
Basophils Absolute: 0.1 10*3/uL (ref 0.0–0.1)
Basophils Relative: 1 %
EOS ABS: 0.2 10*3/uL (ref 0.0–0.5)
Eosinophils Relative: 2 %
HCT: 36.9 % (ref 34.8–46.6)
Hemoglobin: 11.8 g/dL (ref 11.6–15.9)
LYMPHS PCT: 29 %
Lymphs Abs: 2.1 10*3/uL (ref 0.9–3.3)
MCH: 25.6 pg (ref 25.1–34.0)
MCHC: 32 g/dL (ref 31.5–36.0)
MCV: 80.1 fL (ref 79.5–101.0)
MONO ABS: 0.4 10*3/uL (ref 0.1–0.9)
MONOS PCT: 6 %
Neutro Abs: 4.5 10*3/uL (ref 1.5–6.5)
Neutrophils Relative %: 62 %
PLATELETS: 270 10*3/uL (ref 145–400)
RBC: 4.61 MIL/uL (ref 3.70–5.45)
RDW: 15.7 % — AB (ref 11.2–14.5)
WBC: 7.2 10*3/uL (ref 3.9–10.3)

## 2017-12-05 LAB — COMPREHENSIVE METABOLIC PANEL
ALBUMIN: 3.9 g/dL (ref 3.5–5.0)
ALK PHOS: 83 U/L (ref 40–150)
ALT: 19 U/L (ref 0–55)
AST: 22 U/L (ref 5–34)
Anion gap: 7 (ref 3–11)
BUN: 8 mg/dL (ref 7–26)
CALCIUM: 9.3 mg/dL (ref 8.4–10.4)
CO2: 27 mmol/L (ref 22–29)
CREATININE: 0.72 mg/dL (ref 0.60–1.10)
Chloride: 107 mmol/L (ref 98–109)
GFR calc Af Amer: >60 mL/min (ref >60)
GFR calc non Af Amer: >60 mL/min (ref >60)
GLUCOSE: 94 mg/dL (ref 70–140)
Potassium: 3.6 mmol/L (ref 3.5–5.1)
Sodium: 141 mmol/L (ref 136–145)
Total Bilirubin: 0.3 mg/dL (ref 0.2–1.2)
Total Protein: 6.8 g/dL (ref 6.4–8.3)

## 2017-12-05 NOTE — Progress Notes (Addendum)
CLINIC:  Survivorship   REASON FOR VISIT:  Routine follow-up for history of breast cancer.   BRIEF ONCOLOGIC HISTORY:    Malignant neoplasm of upper-outer quadrant of right breast in female, estrogen receptor positive ([place])   03/17/2016 Initial Diagnosis    Breast cancer of upper-outer quadrant of right female breast ([place])      03/24/2016 Surgery    Right lumpectomy ([place]): ILC, grade 2, 1.3 cm, LCIS, margins negative, 3 SLN negative, ER+(95%), PR+(2%), Ki-67 10%, Her2 negative      03/24/2016 Oncotype testing    Oncotype score: 16, ROR 10%.       05/04/2016 - 06/01/2016 Radiation Therapy    Adjuvant Radiation ([place]): 1) Right Breast: 42.72 Gy in 16 fractions.  2) Right Breast Boost: 10 Gy in 5 fractions      07/22/2016 -  Anti-estrogen oral therapy    Anastrozole daily, 5 years of therapy planned.          INTERVAL HISTORY:  Meghan Padilla presents to the [facility] today for routine follow-up for her history of breast cancer.  Overall, she reports feeling quite well. She is taking Anastrozole daily.  She has occasional hot flashes that are tolerable.  She has mild vaginal dryness that she uses Re[place] for.  She denies achiness.  She has bone density typically with her GYN.  She isn't sure when her last one was.      REVIEW OF SYSTEMS:  Review of Systems  Constitutional: Negative for appetite change, chills, fatigue, fever and unexpected weight change.  HENT:   Negative for hearing loss, lump/mass and trouble swallowing.   Eyes: Negative for eye problems and icterus.  Respiratory: Negative for chest tightness, cough and shortness of breath.   Cardiovascular: Negative for chest pain, leg swelling and palpitations.  Gastrointestinal: Negative for abdominal distention, abdominal pain, constipation, diarrhea, nausea and vomiting.  Endocrine: Positive for hot flashes.  Musculoskeletal: Negative for arthralgias.  Skin: Negative for itching and rash.  Neurological:  Negative for dizziness, extremity weakness and numbness.  Hematological: Negative for adenopathy. Does not bruise/bleed easily.  Psychiatric/Behavioral: Negative for depression. The patient is not nervous/anxious.   Breast: Denies any new nodularity, masses, tenderness, nipple changes, or nipple discharge.       PAST MEDICAL/SURGICAL HISTORY:  Past Medical History:  Diagnosis Date  . Breast cancer ([place])   . Cancer [facility]) 2017   right breast  . Diabetes mellitus   . GERD (gastroesophageal reflux disease)   . History of radiation therapy 05/04/16-06/01/16   right breast 42.72 Gy boost to 10 Gy  . Hyperlipidemia   . Hypertension   . Personal history of radiation therapy   . UTI (urinary tract infection)    Past Surgical History:  Procedure Laterality Date  . BREAST BIOPSY    . BREAST LUMPECTOMY     right 03/2016  . DILATION AND CURETTAGE OF UTERUS    . HAND SURGERY    . HAND SURGERY    . HYSTEROSCOPY W/D&C  11/28/2008  . RADIOACTIVE SEED GUIDED PARTIAL MASTECTOMY WITH AXILLARY SENTINEL LYMPH NODE BIOPSY Right 03/24/2016   Procedure: RADIOACTIVE SEED GUIDED PARTIAL MASTECTOMY WITH AXILLARY SENTINEL LYMPH NODE BIOPSY;  Surgeon: Erroll Luna, MD;  Location: [place];  Service: General;  Laterality: Right;  . TONSILLECTOMY       ALLERGIES:  Allergies  Allergen Reactions  . Sulfa Antibiotics Nausea Only     CURRENT MEDICATIONS:  Outpatient Encounter Medications as of 12/05/2017  Medication Sig  . anastrozole (ARIMIDEX) 1 MG tablet Take 1 tablet (1 mg total) by mouth daily.  Marland Kitchen atorvastatin (LIPITOR) 40 MG tablet Take 40 mg by mouth daily.  . cholecalciferol (VITAMIN D) 1000 units tablet Take 1 tablet (1,000 Units total) by mouth daily.  . hydrOXYzine (ATARAX/VISTARIL) 10 MG tablet TAKE 1 TABLET BY MOUTH EVERYDAY AT BEDTIME  . metFORMIN (GLUCOPHAGE) 500 MG tablet Take 1,500 mg by mouth at bedtime.   Marland Kitchen omeprazole (PRILOSEC) 20 MG capsule Take 20 mg by mouth  daily.  . quinapril (ACCUPRIL) 20 MG tablet Take 20 mg by mouth at bedtime.  . sitaGLIPtin (JANUVIA) 100 MG tablet Take 100 mg by mouth daily.  Marland Kitchen tolterodine (DETROL LA) 4 MG 24 hr capsule Take 4 mg by mouth daily.  . [DISCONTINUED] solifenacin (VESICARE) 10 MG tablet Take 10 mg by mouth daily.   No facility-administered encounter medications on file as of 12/05/2017.      ONCOLOGIC FAMILY HISTORY:  Family History  Problem Relation Age of Onset  . Cancer Neg Hx       SOCIAL HISTORY:  Social History   Socioeconomic History  . Marital status: Married    Spouse name: Not on file  . Number of children: 2  . Years of education: Not on file  . Highest education level: Not on file  Occupational History  . Occupation: retired Print production planner  Social Needs  . Financial resource strain: Not on file  . Food insecurity:    Worry: Not on file    Inability: Not on file  . Transportation needs:    Medical: Not on file    Non-medical: Not on file  Tobacco Use  . Smoking status: Never Smoker  . Smokeless tobacco: Never Used  Substance and Sexual Activity  . Alcohol use: No  . Drug use: No  . Sexual activity: Not on file  Lifestyle  . Physical activity:    Days per week: Not on file    Minutes per session: Not on file  . Stress: Not on file  Relationships  . Social connections:    Talks on phone: Not on file    Gets together: Not on file    Attends religious service: Not on file    Active member of club or organization: Not on file    Attends meetings of clubs or organizations: Not on file    Relationship status: Not on file  . Intimate partner violence:    Fear of current or ex partner: Not on file    Emotionally abused: Not on file    Physically abused: Not on file    Forced sexual activity: Not on file  Other Topics Concern  . Not on file  Social History Narrative  . Not on file     PHYSICAL EXAMINATION:  Vital Signs: Vitals:   12/05/17 1255  BP: (!) 158/69    Pulse: 90  Resp: 18  Temp: 98.1 F (36.7 C)  SpO2: 100%   Filed Weights   12/05/17 1255  Weight: 133 lb 1.6 oz (60.4 kg)   General: Well-nourished, well-appearing female in no acute distress.  Unaccompanied today.   HEENT: Head is normocephalic.  Pupils equal and reactive to light. Conjunctivae clear without exudate.  Sclerae anicteric. Oral mucosa is pink, moist.  Oropharynx is pink without lesions or erythema.  Lymph: No cervical, supraclavicular, or infraclavicular lymphadenopathy noted on palpation.  Cardiovascular: Regular rate and rhythm.Marland Kitchen Respiratory: Clear to auscultation bilaterally. Chest expansion  symmetric; breathing non-labored.  Breast Exam:  -Left breast: No appreciable masses on palpation. No skin redness, thickening, or peau d'orange appearance; no nipple retraction or nipple discharge; .  -Right breast: No appreciable masses on palpation. No skin redness, or peau d'orange appearance; no nipple retraction or nipple discharge;  mild distortion in symmetry at previous lumpectomy site well healed scar without erythema or nodularity. There is an area of thickening in the upper inner quadrant of the left breast that is new per the patient -Axilla: No axillary adenopathy bilaterally.  GI: Abdomen soft and round; non-tender, non-distended. Bowel sounds normoactive. No hepatosplenomegaly.   GU: Deferred.  Neuro: No focal deficits. Steady gait.  Psych: Mood and affect normal and appropriate for situation.  MSK: No focal spinal tenderness to palpation, full range of motion in bilateral upper extremities Extremities: No edema. Skin: Warm and dry.  LABORATORY DATA:  None for this visit   DIAGNOSTIC IMAGING:  Most recent mammogram:      ASSESSMENT AND PLAN:  Ms.. Dombek is a pleasant 67 y.o. female with history of Stage IA right breast invasive lobular carcinoma, ER+/PR+/HER2-, diagnosed in 02/2016, treated with lumpectomy, adjuvant radiation therapy, and anti-estrogen  therapy with Anastrozole  beginning in 07/2016.  She [facility] for surveillance and routine follow-up.   1. History of breast cancer:  Ms. Vecchio is currently clinically and radiographically without evidence of disease or recurrence of breast cancer. She will be due for mammogram in 02/2018; orders placed today.  She will continue her anti-estrogen therapy with Anastrozole, with plans to continue for 5 years.  She will return to the cancer center to see her medical oncologist, Dr. Jana Hakim in 6 months time.  I encouraged her to call me with any questions or concerns before her next visit at the cancer center, and I would be happy to see her sooner, if needed.    2. Right breast thickness: This is new, and I ordered a diagnostic right breast mammogram and ultrasound to evaluate.  3. Bone health:  Given Ms. Marandola age, history of breast cancer, and her current anti-estrogen therapy with Anastrozole, she is at risk for bone demineralization. She is unsure when her last DEXA was, she thinks a few years ago, and is unsure of the results.  She has this done with her GYN and will see them again in 2 months.  In the meantime, she was encouraged to increase her consumption of foods rich in calcium, as well as increase her weight-bearing activities.  She was given education on specific food and activities to promote bone health.  4. Cancer screening:  Due to Ms. Bittick's history and her age, she should receive screening for skin cancers, colon cancer, and gynecologic cancers. She was encouraged to follow-up with her PCP for appropriate cancer screenings.   5. Health maintenance and wellness promotion: Ms. Pain was encouraged to consume 5-7 servings of fruits and vegetables per day. She was also encouraged to engage in moderate to vigorous exercise for 30 minutes per day most days of the week. She was instructed to limit her alcohol consumption and continue to abstain from tobacco use.     Dispo:  -Return to cancer center in 6 months for f/u with Dr. Jana Hakim and in one year for LTS follow up -Right breast mammogram and ultrasound -Mammogram due 02/2018   A total of (30) minutes of face-to-face time was spent with this patient with greater than 50% of that time in counseling  and care-coordination.   Gardenia Phlegm, [place] [phone]   Note: PRIMARY CARE PROVIDER [name], [place] [phone]

## 2017-12-05 NOTE — Telephone Encounter (Signed)
Patient decline avs and calendar °

## 2017-12-06 ENCOUNTER — Encounter: Payer: Self-pay | Admitting: Adult Health

## 2017-12-06 LAB — VITAMIN D 25 HYDROXY (VIT D DEFICIENCY, FRACTURES): Vit D, 25-Hydroxy: 33.3 ng/mL (ref 30.0–100.0)

## 2017-12-13 ENCOUNTER — Ambulatory Visit
Admission: RE | Admit: 2017-12-13 | Discharge: 2017-12-13 | Disposition: A | Discharge status: Home / Self Care | Payer: 59 | Source: Ambulatory Visit | Attending: Adult Health | Admitting: Adult Health

## 2017-12-13 ENCOUNTER — Other Ambulatory Visit: Payer: 59

## 2017-12-13 DIAGNOSIS — C50411 Malignant neoplasm of upper-outer quadrant of right female breast: Secondary | ICD-10-CM

## 2017-12-13 DIAGNOSIS — N6312 Unspecified lump in the right breast, upper inner quadrant: Secondary | ICD-10-CM

## 2017-12-13 DIAGNOSIS — Z170 Estrogen receptor positive status [ER+]: Secondary | ICD-10-CM

## 2017-12-13 IMAGING — [facility] DIGITAL DIAGNOSTIC UNILATERAL RIGHT MAMMOGRAM WITH TOMO AND CAD
6 series · 6 of 18 positions shown · non-contrast
Comparison: Previous exam(s).

CLINICAL DATA: Patient presents for palpable abnormality within the
upper right breast posterior depth.

EXAM:
DIGITAL DIAGNOSTIC RIGHT MAMMOGRAM WITH CAD AND TOMO
ULTRASOUND RIGHT BREAST

[R TAN synth-2D]
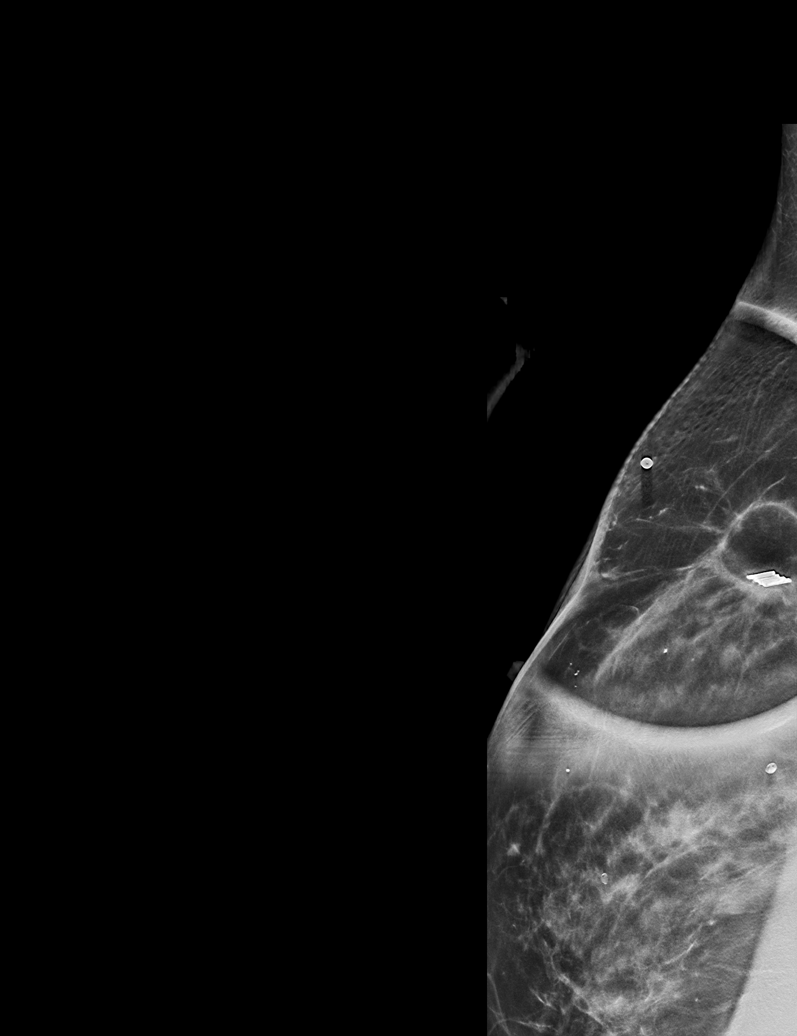

[R MLO synth-2D]
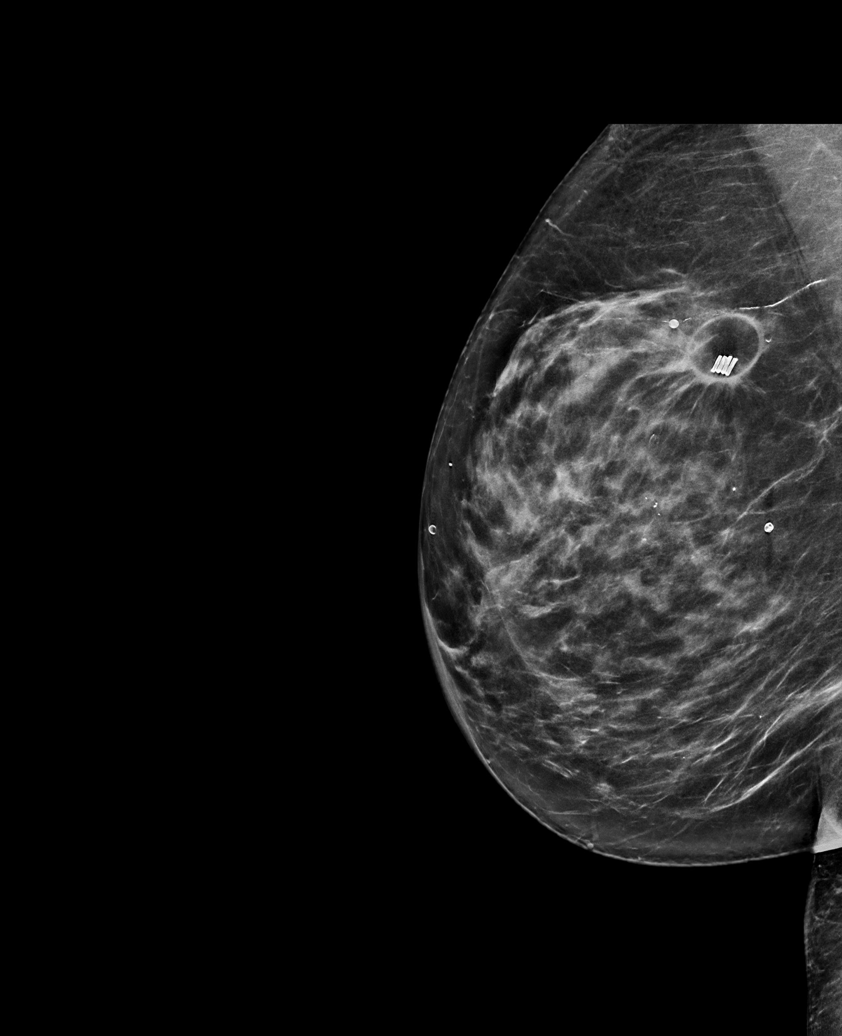

[R CC synth-2D]
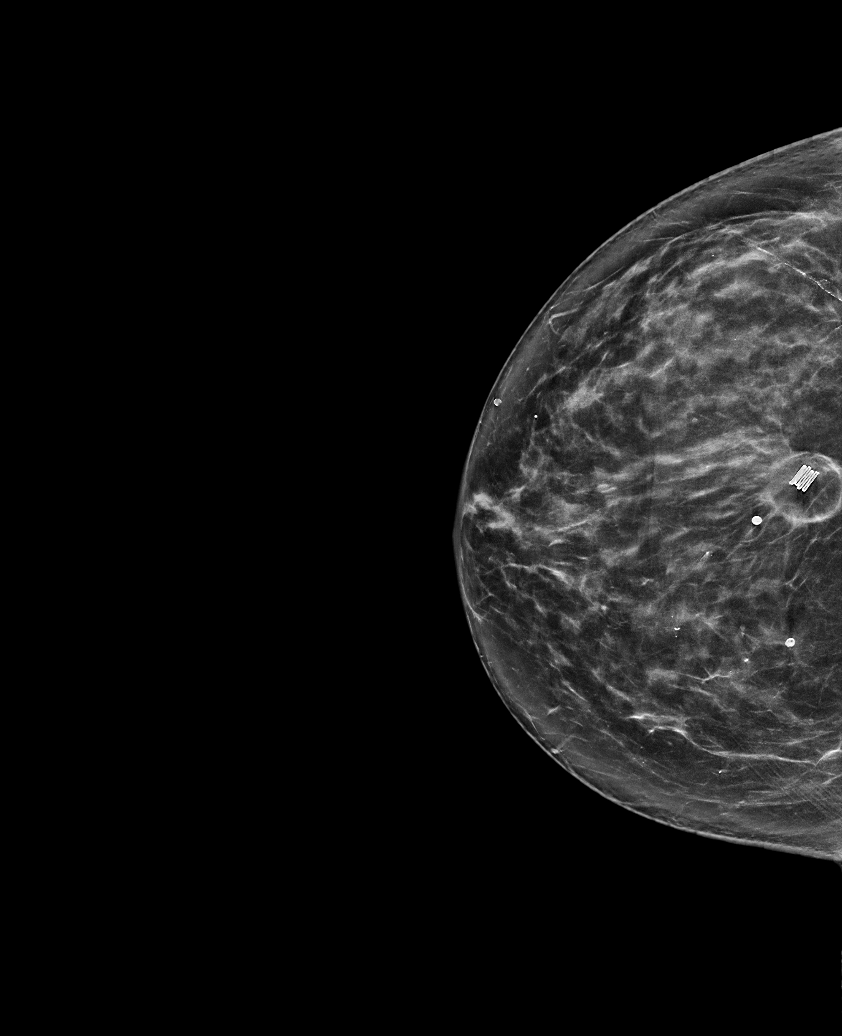

[R CC tomo · tomo slice 39/77.0]
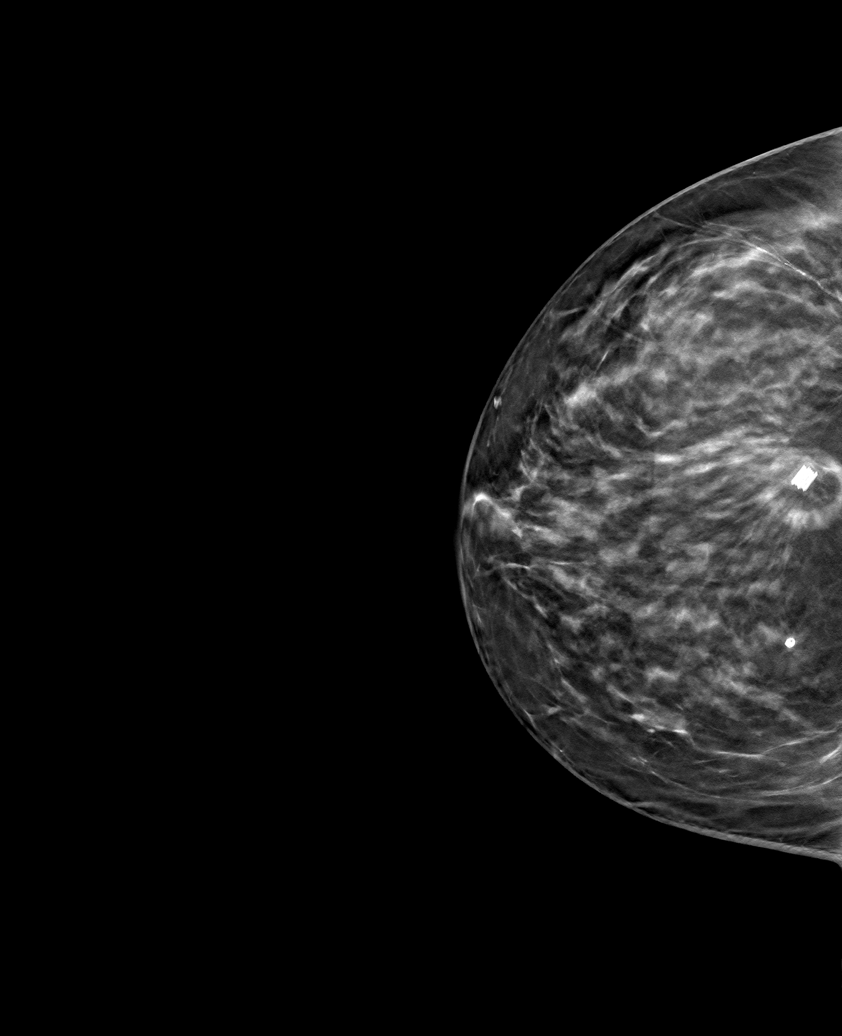

[R MLO tomo · tomo slice 39/76.0]
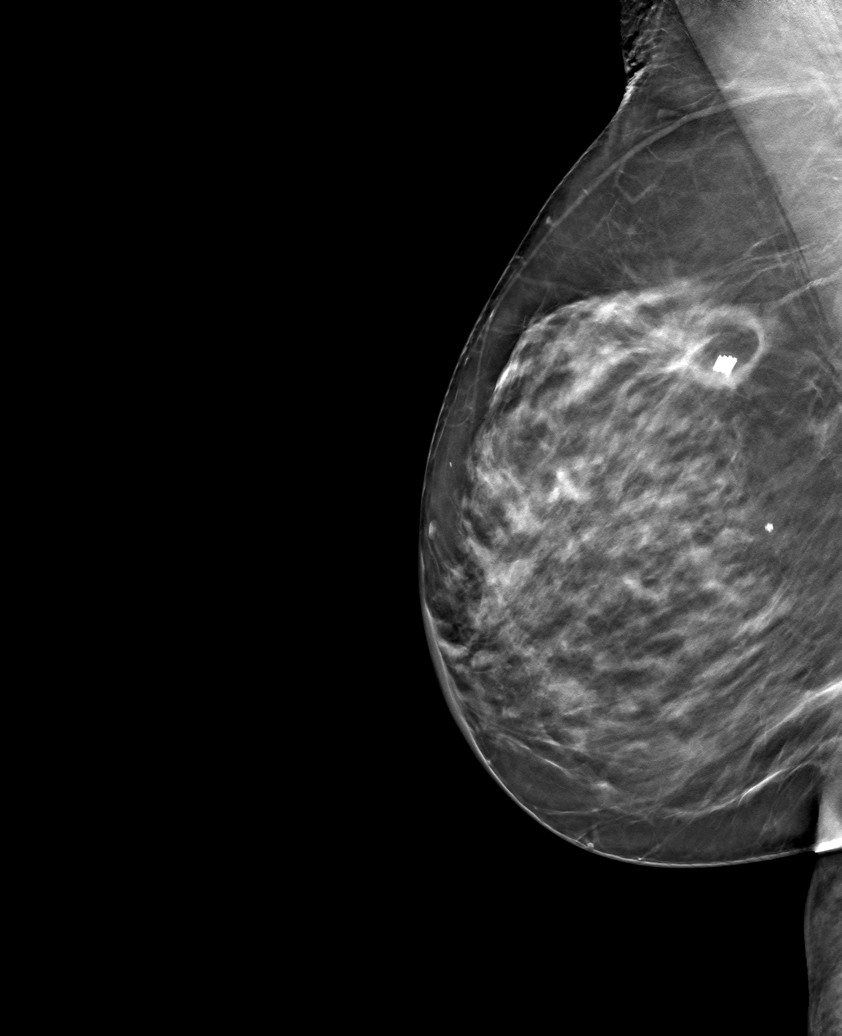

[R TAN tomo · tomo slice 35/70.0]
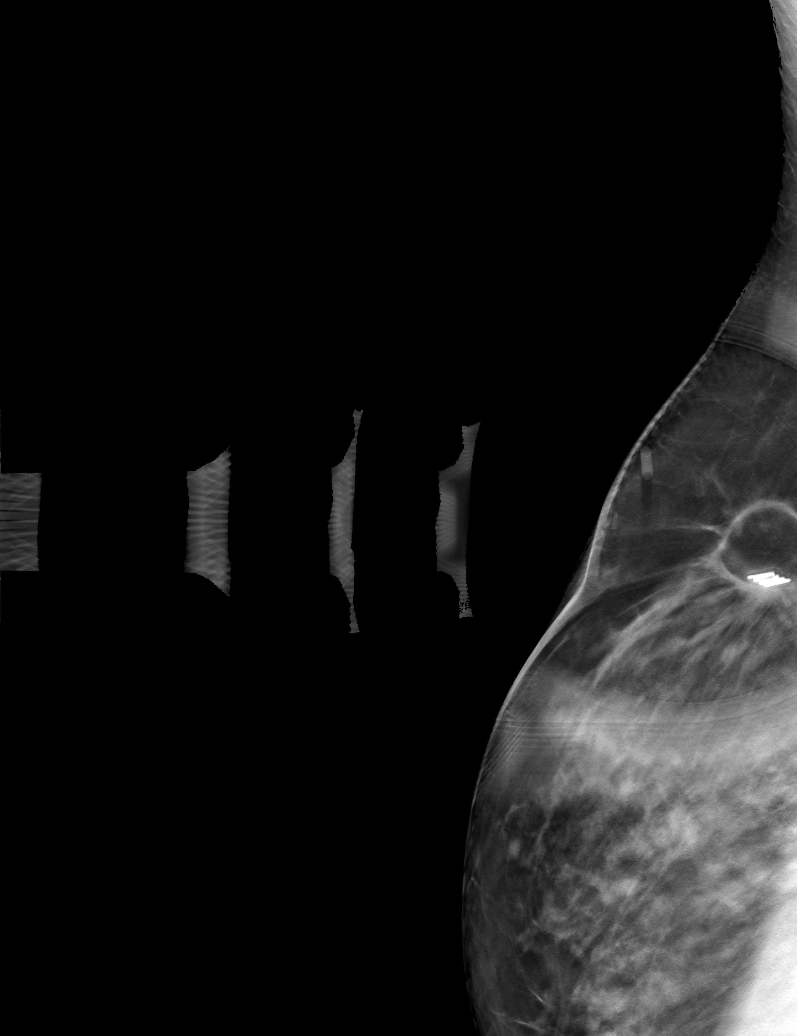

[6 of 18 positions shown; findings below may reference images not displayed]

ACR Breast Density Category c: The breast tissue is heterogeneously
dense, which may obscure small masses.
FINDINGS: Re demonstrated postlumpectomy changes within the superior posterior
right breast. Surgical changes with central fatty density are
identified underlying the palpable marker within the superior
posterior right breast on tangential view, corresponding with
palpable abnormality. No suspicious masses or areas of nonsurgical
distortion identified within the right breast.

Mammographic images were processed with CAD.

On physical exam, there is a palpable mass within the
posterosuperior right breast.

Targeted ultrasound is performed, showing a 1.8 x 1.5 x 1.8 cm mixed
echogenicity round mass within the right breast 12:30 o'clock 9 cm
from the nipple, corresponding with palpable abnormality. This
corresponds with postsurgical change/fat necrosis identified on
mammogram.
IMPRESSION: Palpable abnormality within the right breast compatible with post
lumpectomy changes/fat necrosis.

No mammographic evidence for malignancy.

RECOMMENDATION:
Return to bilateral diagnostic mammography [DATE].

I have discussed the findings and recommendations with the patient.
Results were also provided in writing at the conclusion of the
visit. If applicable, a reminder letter will be sent to the patient
regarding the next appointment.

BI-RADS CATEGORY  2: Benign.

## 2017-12-13 IMAGING — US ULTRASOUND RIGHT BREAST LIMITED
1 series · 6 of 6 positions shown · non-contrast
Comparison: Previous exam(s).

CLINICAL DATA: Patient presents for palpable abnormality within the
upper right breast posterior depth.

EXAM:
DIGITAL DIAGNOSTIC RIGHT MAMMOGRAM WITH CAD AND TOMO
ULTRASOUND RIGHT BREAST

[Series 1: ultrasound right breast limited · 0.07mm/px · 6 of 6 slices shown]
[im 1/6]
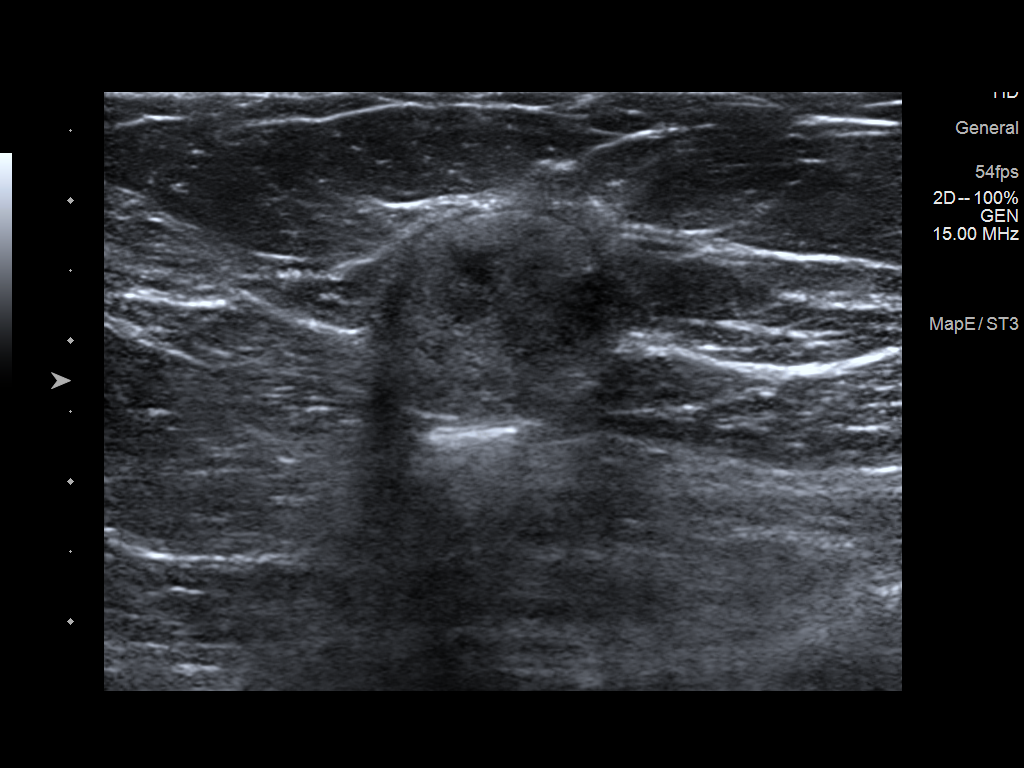
[im 2/6]
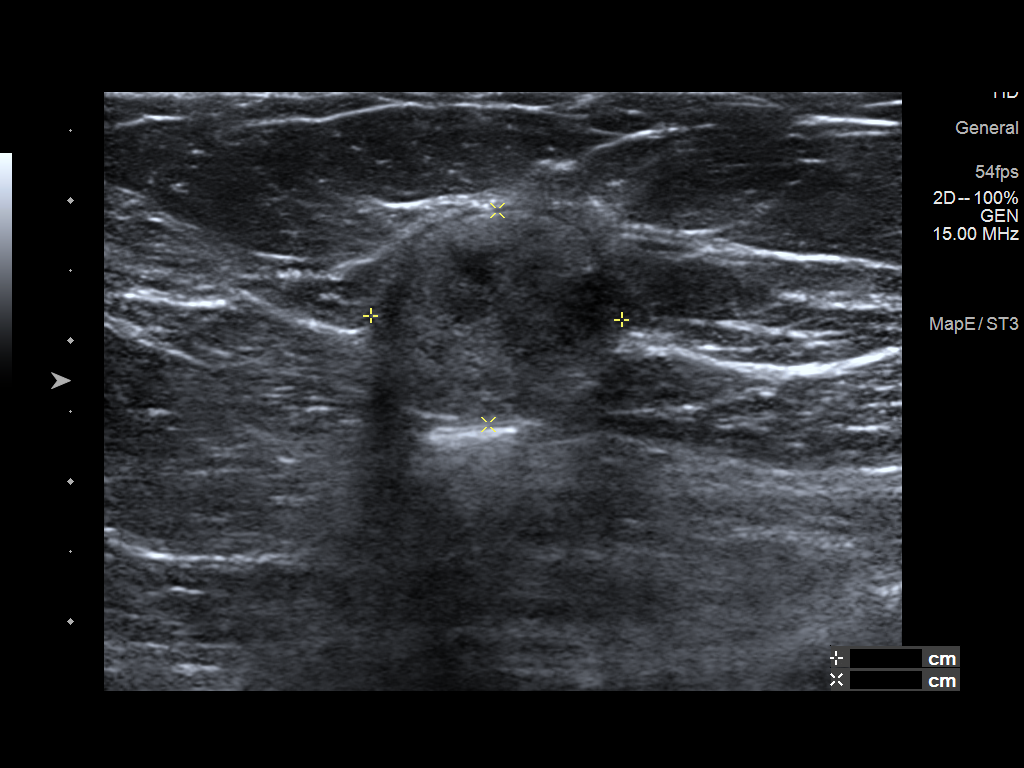
[im 3/6]
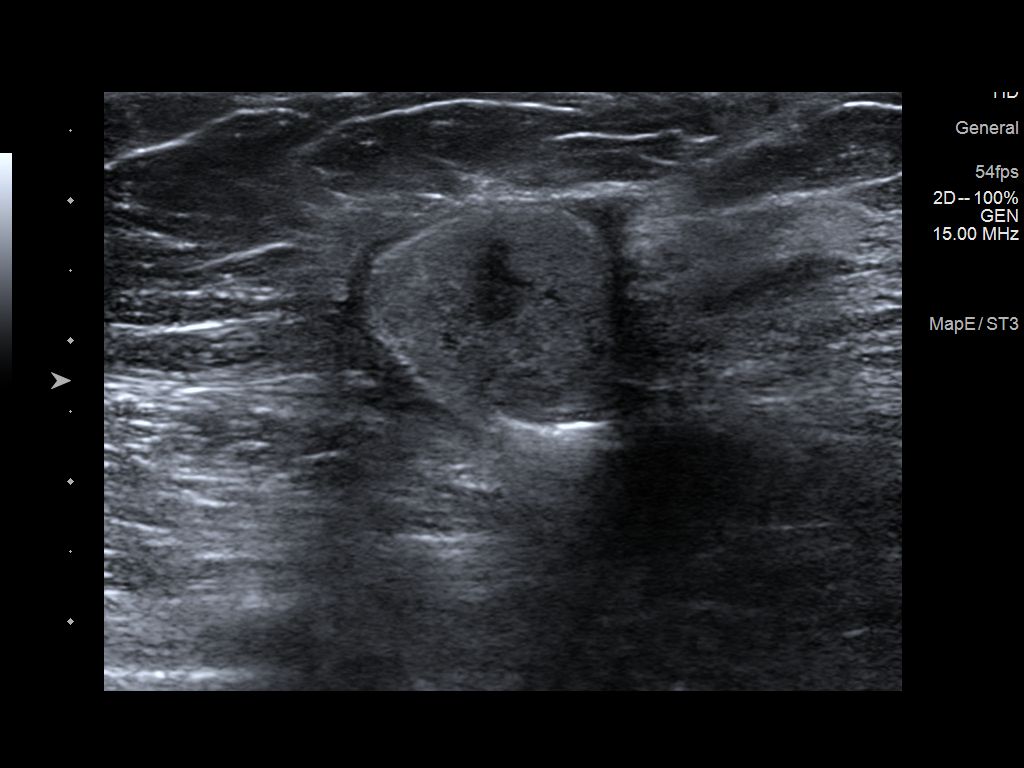
[im 4/6]
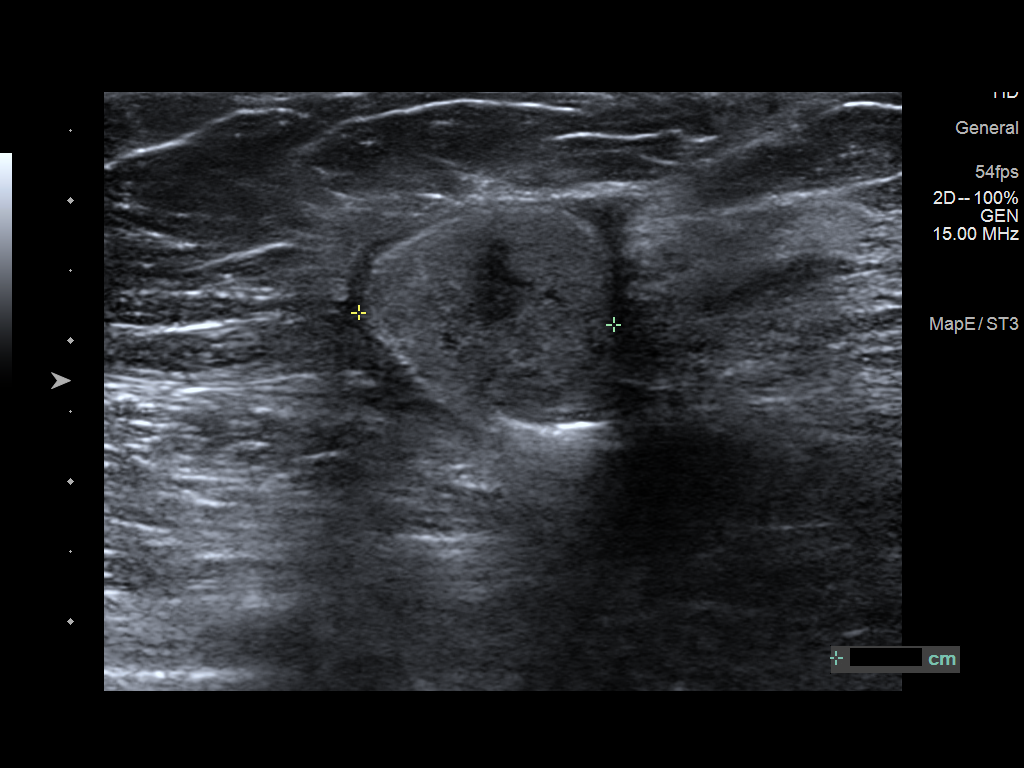
[im 5/6]
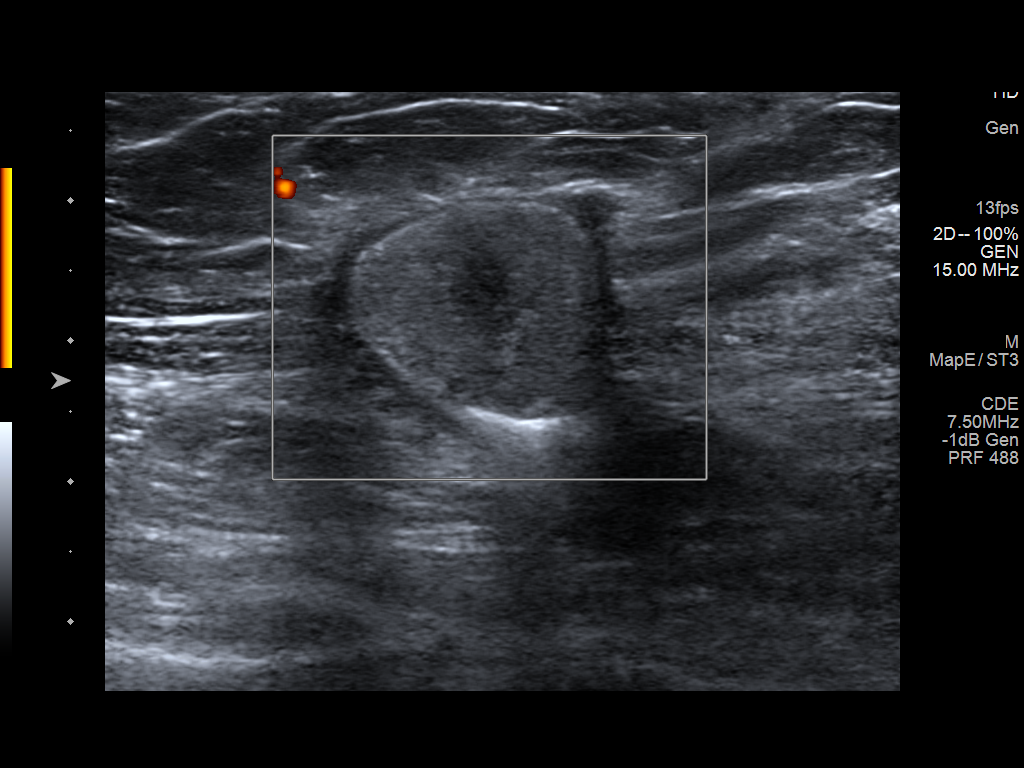
[im 6/6]
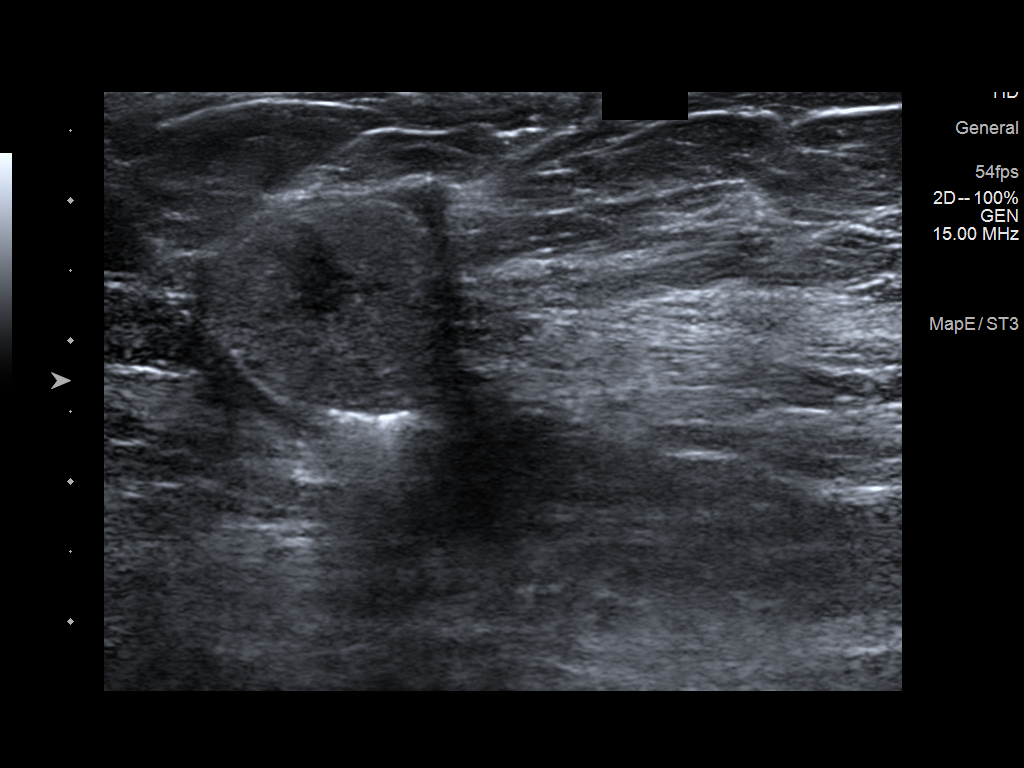

[6 of 6 positions shown; findings below may reference images not displayed]

ACR Breast Density Category c: The breast tissue is heterogeneously
dense, which may obscure small masses.
FINDINGS: Re demonstrated postlumpectomy changes within the superior posterior
right breast. Surgical changes with central fatty density are
identified underlying the palpable marker within the superior
posterior right breast on tangential view, corresponding with
palpable abnormality. No suspicious masses or areas of nonsurgical
distortion identified within the right breast.

Mammographic images were processed with CAD.

On physical exam, there is a palpable mass within the
posterosuperior right breast.

Targeted ultrasound is performed, showing a 1.8 x 1.5 x 1.8 cm mixed
echogenicity round mass within the right breast 12:30 o'clock 9 cm
from the nipple, corresponding with palpable abnormality. This
corresponds with postsurgical change/fat necrosis identified on
mammogram.
IMPRESSION: Palpable abnormality within the right breast compatible with post
lumpectomy changes/fat necrosis.

No mammographic evidence for malignancy.

RECOMMENDATION:
Return to bilateral diagnostic mammography [DATE].

I have discussed the findings and recommendations with the patient.
Results were also provided in writing at the conclusion of the
visit. If applicable, a reminder letter will be sent to the patient
regarding the next appointment.

BI-RADS CATEGORY  2: Benign.

## 2018-01-19 ENCOUNTER — Other Ambulatory Visit: Payer: Self-pay | Admitting: Adult Health

## 2018-01-19 DIAGNOSIS — Z853 Personal history of malignant neoplasm of breast: Secondary | ICD-10-CM

## 2018-03-06 ENCOUNTER — Ambulatory Visit
Admission: RE | Admit: 2018-03-06 | Discharge: 2018-03-06 | Disposition: A | Discharge status: Home / Self Care | Payer: 59 | Source: Ambulatory Visit | Attending: Adult Health | Admitting: Adult Health

## 2018-03-06 DIAGNOSIS — Z853 Personal history of malignant neoplasm of breast: Secondary | ICD-10-CM

## 2018-03-06 IMAGING — MG DIGITAL DIAGNOSTIC BILATERAL MAMMOGRAM WITH TOMO AND CAD
4 series · 4 of 12 positions shown · non-contrast
Comparison: Previous exam(s).

CLINICAL DATA: 66-year-old female presenting for routine annual
surveillance status post right breast lumpectomy in [DATE].

EXAM:
DIGITAL DIAGNOSTIC BILATERAL MAMMOGRAM WITH CAD AND TOMO

[R TAN]
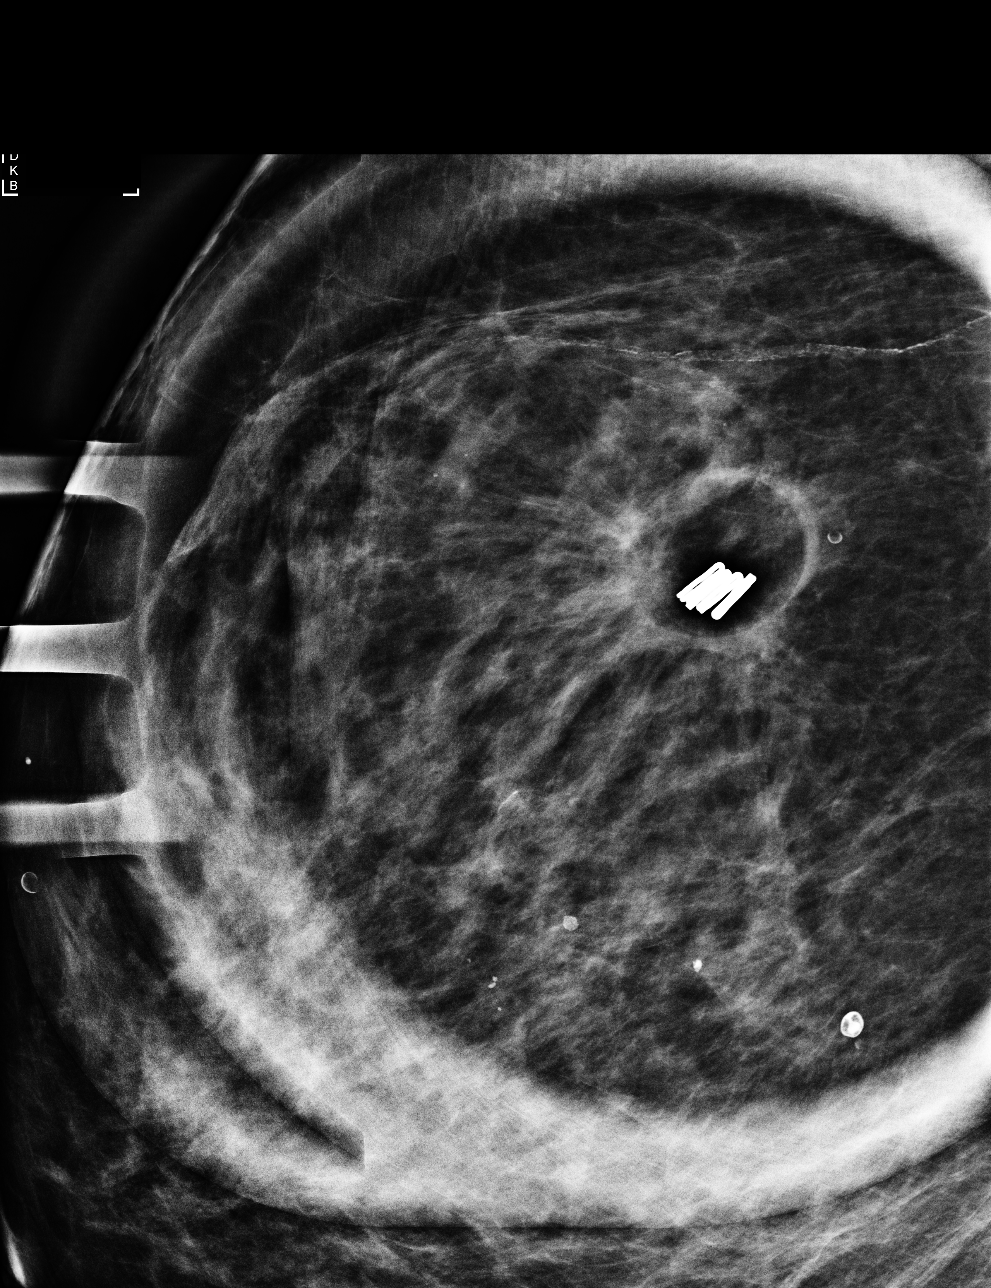

[R MLO synth-2D]
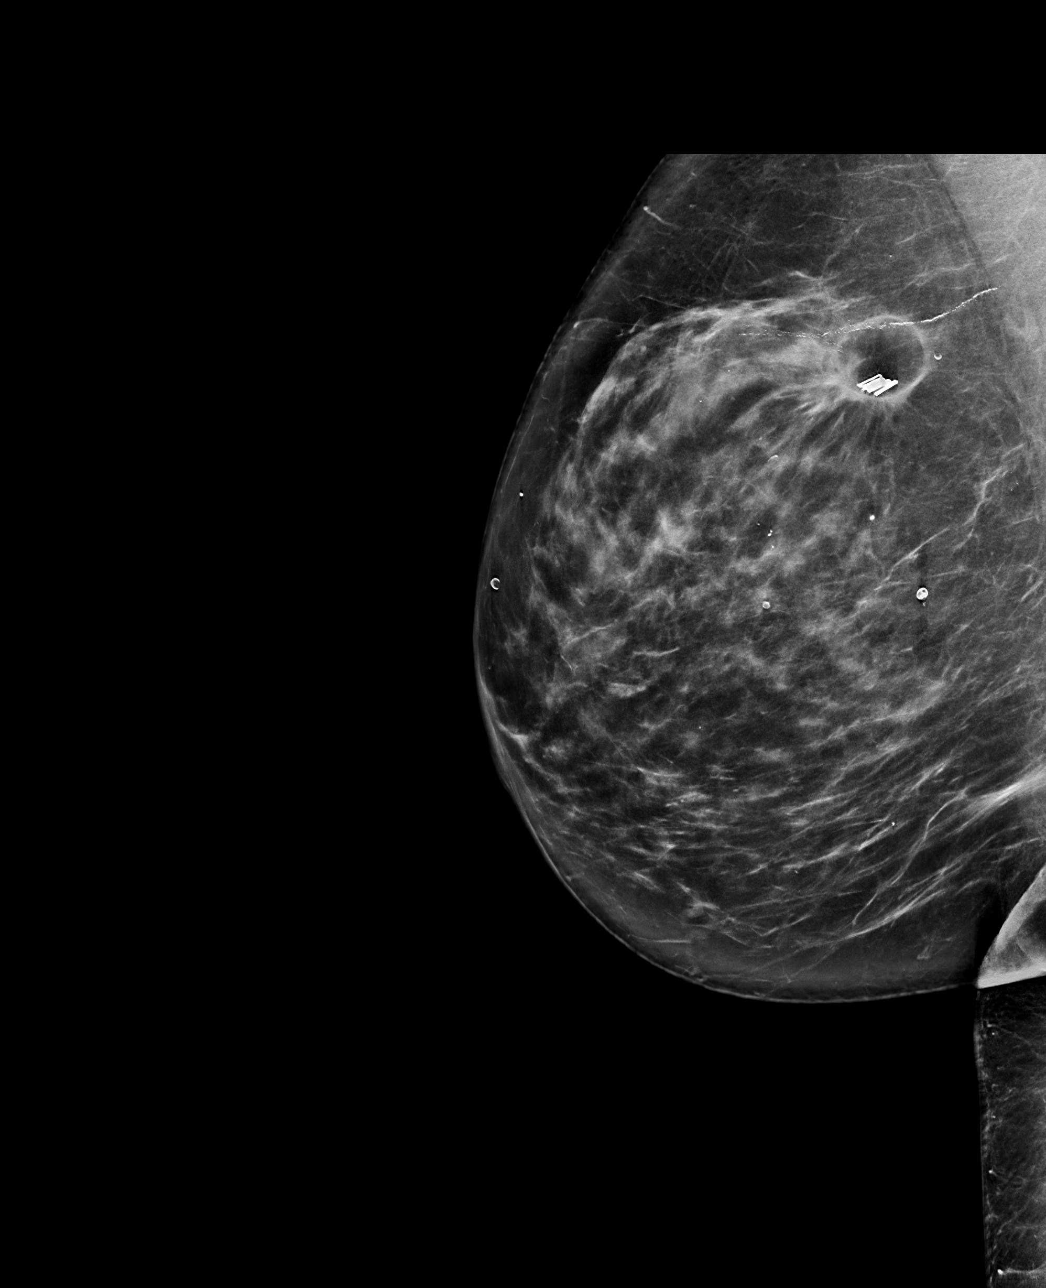

[R MLO tomo · tomo slice 43/84.0]
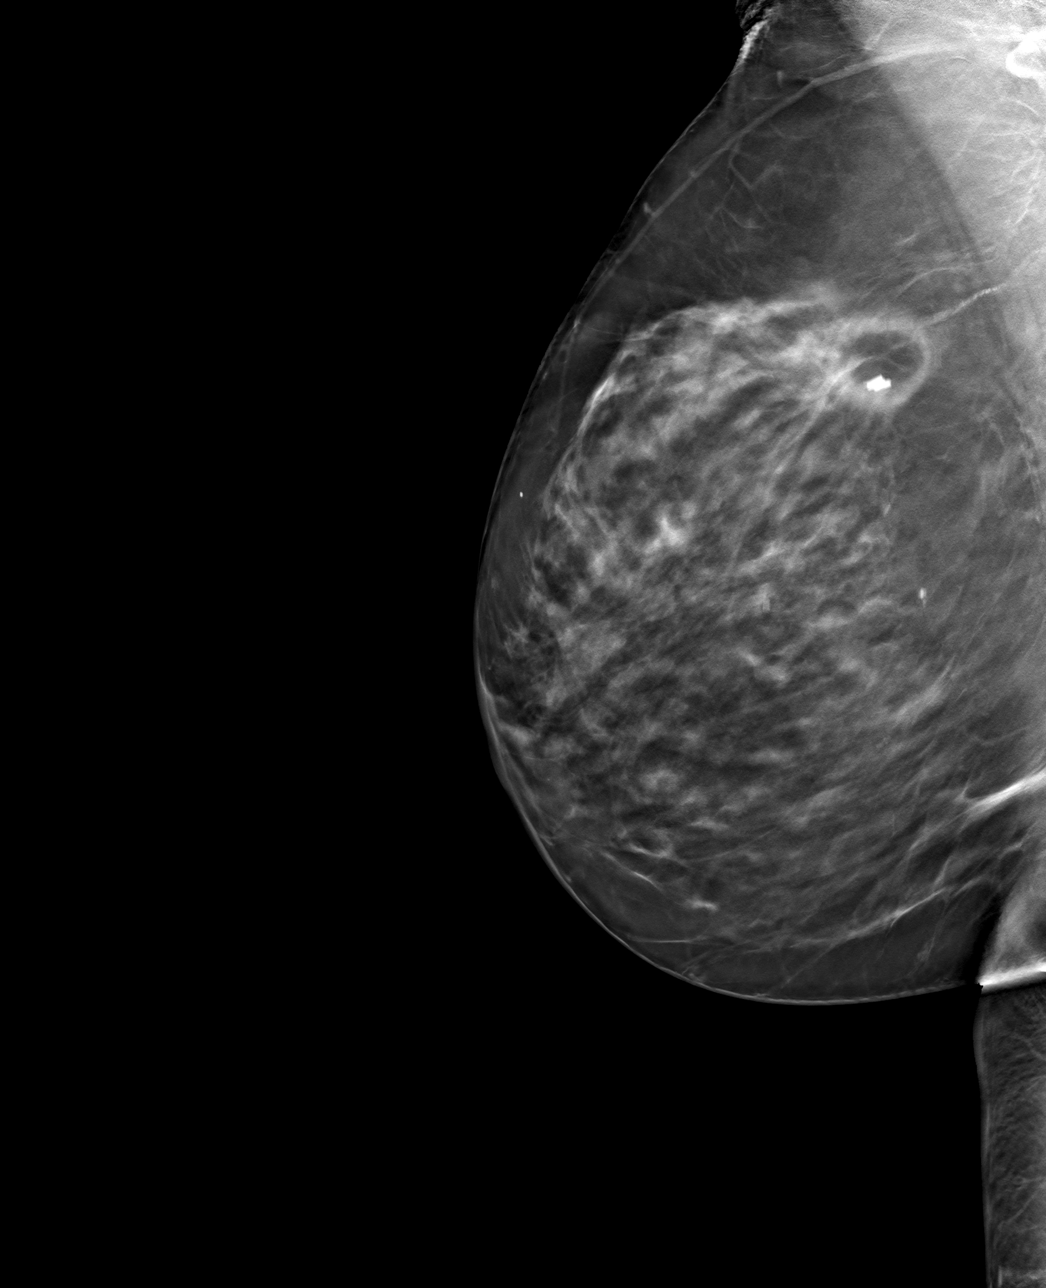

[L MLO tomo · tomo slice 42/83.0]
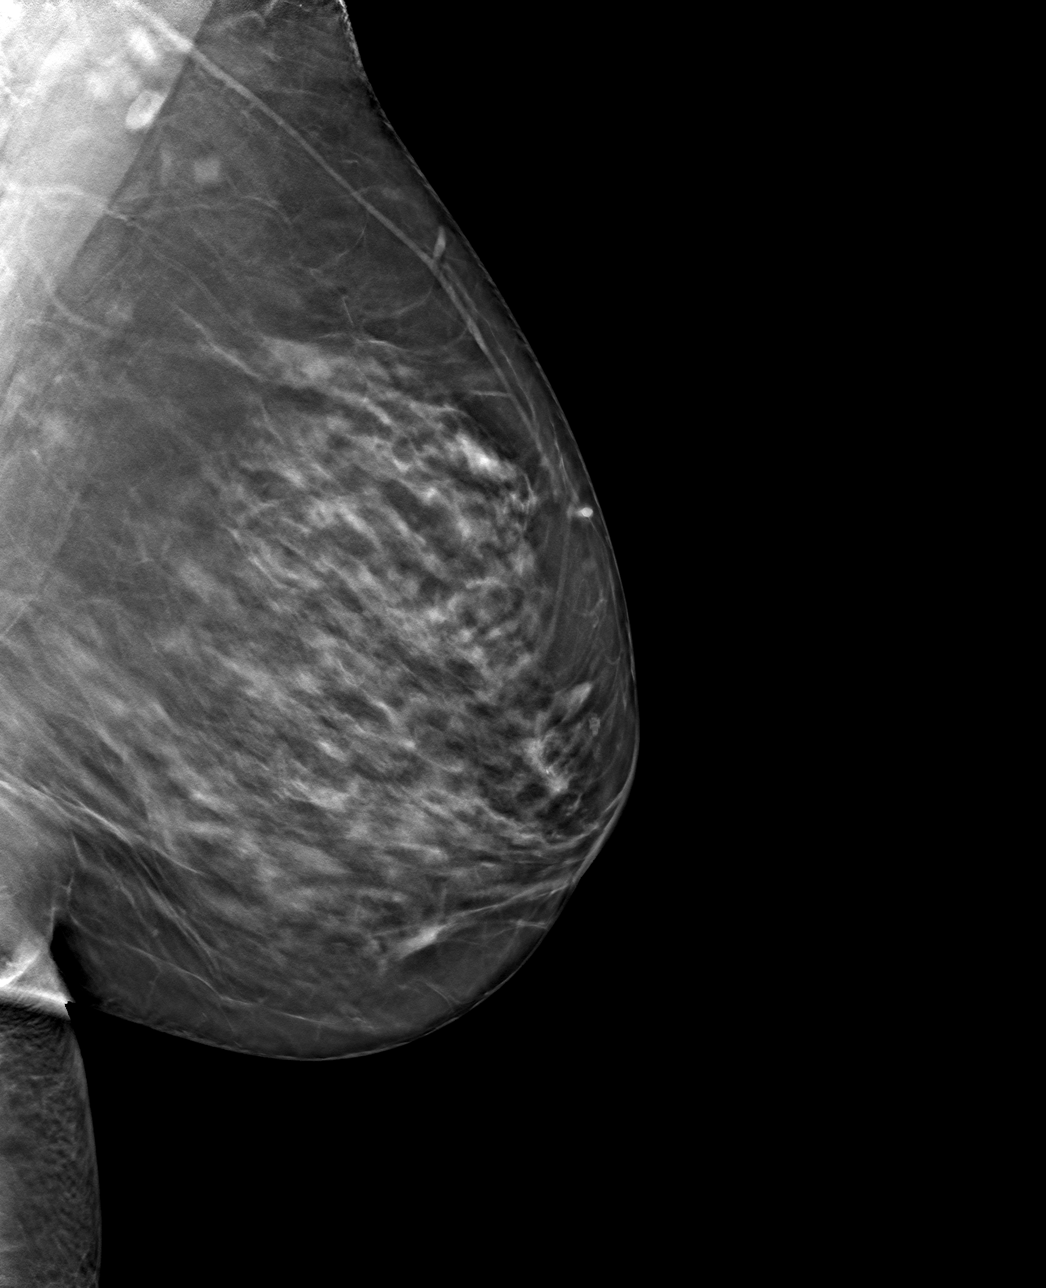

[4 of 12 positions shown; findings below may reference images not displayed]

ACR Breast Density Category c: The breast tissue is heterogeneously
dense, which may obscure small masses.
FINDINGS: The right breast lumpectomy site is stable. No suspicious
calcifications, masses or areas of distortion are seen in the
bilateral breasts.

Mammographic images were processed with CAD.
IMPRESSION: Stable right breast lumpectomy site. No mammographic evidence of
malignancy in the bilateral breasts.

RECOMMENDATION:
Diagnostic mammogram is suggested in 1 year. (Code:[U2])

I have discussed the findings and recommendations with the patient.
Results were also provided in writing at the conclusion of the
visit. If applicable, a reminder letter will be sent to the patient
regarding the next appointment.

BI-RADS CATEGORY  2: Benign.

## 2018-05-26 ENCOUNTER — Other Ambulatory Visit: Payer: Self-pay | Admitting: Oncology

## 2018-06-01 NOTE — Progress Notes (Signed)
Meghan Padilla  [phone] Fax:([phone]) [phone]     ID: Meghan Padilla DOB: June 25, 1951  MR#: [id number]  [facility]#:[id number]  Patient Care Team: [name], MD as PCP - General (Family Medicine) , [name], MD as Consulting Physician (Oncology) Erroll Luna, MD as Consulting Physician (General Surgery) Gery Pray, MD as Consulting Physician (Radiation Oncology) Arvella Nigh, MD (Obstetrics and Gynecology) Franchot Gallo, MD as Consulting Physician (Urology) Wilford Corner, MD as Consulting Physician (Gastroenterology) Delice Bison, [name], NP as Nurse Practitioner (Hematology and Oncology) OTHER MD:  CHIEF COMPLAINT: Estrogen receptor positive breast cancer  CURRENT TREATMENT: Anastrozole   BREAST CANCER HISTORY: From the original intake note:  Meghan Padilla routine [facility] showing a possible right breast distortion and or mass and possibly a right axillary lymph node. She was recalled for diagnostic right mammography with tomography and ultrasonography at the [facility] 02/24/2016. This found her right breast density to be category C. In the upper right breast there was a site of distortion. The mass of concern was consistent with an intramamm [name]. On exam there was no palpable abnormality. Ultrasonography found a hypoechoic irregular mass with spiculated margins at the 12:00 position of the right breast measuring 0.5 cm. Ultrasound of the right axilla showed multiple normal-appearing lymph nodes.  Biopsy of the right breast mass in question 02/29/2016 showed orifices [id number]) and invasive ductal carcinoma, grade 2, estrogen receptor 95% positive, with strong staining intensity, progesterone receptor 2% positive, with strong staining intensity, with an MIB-1 of 10%, and no HER-2 amplification, the signals ratio being 1.48 and the number per cell 2.30.  Her  subsequent history is as detailed below.  INTERVAL HISTORY: Allecia returns today for follow-up and treatment of her estrogen receptor positive breast cancer. She continues on anastrozole, with good tolerance. She denies issues with hot flashes or vaginal dryness.    Since her last visit, she underwent diagnostic bilateral mammography with CAD and tomography on 03/06/2018 at [facility] showing: breast density category C. There was no evidence of malignancy  She also completed a bone density recently at [facility] under Dr. Radene Knee. She notes that it showed a slight change in her bone density but it was still "nothing to worry about".  She has been having some vaginal issues and has seen her urologist regarding this.  They have suggested she consider vaginal estrogens and she wanted to discuss that today as well.  REVIEW OF SYSTEMS: Meghan Padilla reports that she is working at home and in her yard. For exercise, she walks a little outside. She has a dog, but he has health issues and can't walk much. She very rarely has nausea after she takes anastrozole. She takes omeprozole.. She denies unusual headaches, visual changes, nausea, vomiting, or dizziness. There has been no unusual cough, phlegm production, or pleurisy. There has been no change in bowel or bladder habits. She denies unexplained fatigue or unexplained weight loss, bleeding, rash, or fever. A detailed review of systems was otherwise stable.   PAST MEDICAL HISTORY: Past Medical History:  Diagnosis Date  . Breast cancer (Meghan Padilla)   . Cancer Meghan Padilla) 2017   right breast  . Diabetes mellitus   . GERD (gastroesophageal reflux disease)   . History of radiation therapy 05/04/16-06/01/16   right breast 42.72 Gy boost to 10 Gy  . Hyperlipidemia   . Hypertension   . Personal history of radiation therapy 05/05/2016   ended  radiaiton 06-01-2016  . UTI (urinary tract infection)     PAST SURGICAL HISTORY: Past Surgical History:    Procedure Laterality Date  . BREAST BIOPSY    . BREAST LUMPECTOMY     right 03/2016  . DILATION AND CURETTAGE OF UTERUS    . HAND SURGERY    . HAND SURGERY    . HYSTEROSCOPY W/D&C  11/28/2008  . RADIOACTIVE SEED GUIDED PARTIAL MASTECTOMY WITH AXILLARY SENTINEL LYMPH NODE BIOPSY Right 03/24/2016   Procedure: RADIOACTIVE SEED GUIDED PARTIAL MASTECTOMY WITH AXILLARY SENTINEL LYMPH NODE BIOPSY;  Surgeon: Erroll Luna, MD;  Location: [facility];  Service: General;  Laterality: Right;  . TONSILLECTOMY      FAMILY HISTORY Family History  Problem Relation Age of Onset  . Cancer Neg Hx    The patient's parents are still living, in their late 53s. The patient had no brothers, 2 sisters. There is no history of breast or ovarian or any other cancer in the family to the patient's knowledge   GYNECOLOGIC HISTORY:  No LMP recorded. Patient is postmenopausal.  menarche age 29, first live birth age 34. The patient went through menopause in her early 43s. She did not take hormone replacement. 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 Her husband Meghan Padilla was a Dealer at [employer]. He is retired and disabled after multiple back surgeries. Daughter Meghan Padilla lives in [place] and is disabled secondary to cognitive and behavioral issues. Son Meghan Padilla lives in [place] where he does car painting and body work. The patient has no grandchildren. She is a Tourist information centre manager.    ADVANCED DIRECTIVES: Not in place    HEALTH MAINTENANCE: Social History   Tobacco Use  . Smoking status: Never Smoker  . Smokeless tobacco: Never Used  Substance Use Topics  . Alcohol use: No  . Drug use: No     Colonoscopy:  PAP:  Bone density:   Allergies  Allergen Reactions  . Sulfa Antibiotics Nausea Only    Current Outpatient Medications  Medication Sig  Dispense Refill  . anastrozole (ARIMIDEX) 1 MG tablet TAKE 1 TABLET BY MOUTH EVERY DAY 90 tablet 0  . atorvastatin (LIPITOR) 40 MG tablet Take 40 mg by mouth daily.    . cholecalciferol (VITAMIN D) 1000 units tablet Take 1 tablet (1,000 Units total) by mouth daily. 100 tablet 4  . hydrOXYzine (ATARAX/VISTARIL) 10 MG tablet TAKE 1 TABLET BY MOUTH EVERYDAY AT BEDTIME  6  . metFORMIN (GLUCOPHAGE) 500 MG tablet Take 1,500 mg by mouth at bedtime.     Marland Kitchen omeprazole (PRILOSEC) 20 MG capsule Take 20 mg by mouth daily.    . quinapril (ACCUPRIL) 20 MG tablet Take 20 mg by mouth at bedtime.    . sitaGLIPtin (JANUVIA) 100 MG tablet Take 100 mg by mouth daily.    Marland Kitchen tolterodine (DETROL LA) 4 MG 24 hr capsule Take 4 mg by mouth daily.     No current facility-administered medications for this visit.     OBJECTIVE: Middle-aged white woman who appears stated age  9:   06/05/18 1030  BP: (!) 147/83  Pulse: 88  Resp: 18  Temp: 98.4 F (36.9 C)  SpO2: 100%     Body mass index is 24.98 kg/m.    ECOG FS:1 - Symptomatic but completely ambulatory  Sclerae unicteric, EOMs intact Oropharynx clear and  moist No cervical or supraclavicular adenopathy Lungs no rales or rhonchi Heart regular rate and rhythm Abd soft, nontender, positive bowel sounds MSK no focal spinal tenderness, no upper extremity lymphedema Neuro: nonfocal, well oriented, appropriate affect Breasts: The right breast is status post lumpectomy followed by radiation.  There is no evidence of disease recurrence.  The left breast is benign.  Both axillae are benign.   LAB RESULTS:  CMP     Component Value Date/Time   NA 141 12/05/2017 1227   NA 142 04/10/2017 1329   K 3.6 12/05/2017 1227   K 3.5 04/10/2017 1329   CL 107 12/05/2017 1227   CO2 27 12/05/2017 1227   CO2 31 (H) 04/10/2017 1329   GLUCOSE 94 12/05/2017 1227   GLUCOSE 174 (H) 04/10/2017 1329   BUN 8 12/05/2017 1227   BUN 7.3 04/10/2017 1329   CREATININE 0.72  12/05/2017 1227   CREATININE 0.8 04/10/2017 1329   CALCIUM 9.3 12/05/2017 1227   CALCIUM 9.4 04/10/2017 1329   PROT 6.8 12/05/2017 1227   PROT 6.4 04/10/2017 1329   ALBUMIN 3.9 12/05/2017 1227   ALBUMIN 3.6 04/10/2017 1329   AST 22 12/05/2017 1227   AST 22 04/10/2017 1329   ALT 19 12/05/2017 1227   ALT 20 04/10/2017 1329   ALKPHOS 83 12/05/2017 1227   ALKPHOS 78 04/10/2017 1329   BILITOT 0.3 12/05/2017 1227   BILITOT 0.25 04/10/2017 1329   GFRNONAA >60 12/05/2017 1227   GFRAA >60 12/05/2017 1227    INo results found for: SPEP, UPEP  Lab Results  Component Value Date   WBC 7.2 06/05/2018   NEUTROABS 4.5 06/05/2018   HGB 11.3 (L) 06/05/2018   HCT 36.4 06/05/2018   MCV 82.0 06/05/2018   PLT 274 06/05/2018      Chemistry      Component Value Date/Time   NA 141 12/05/2017 1227   NA 142 04/10/2017 1329   K 3.6 12/05/2017 1227   K 3.5 04/10/2017 1329   CL 107 12/05/2017 1227   CO2 27 12/05/2017 1227   CO2 31 (H) 04/10/2017 1329   BUN 8 12/05/2017 1227   BUN 7.3 04/10/2017 1329   CREATININE 0.72 12/05/2017 1227   CREATININE 0.8 04/10/2017 1329      Component Value Date/Time   CALCIUM 9.3 12/05/2017 1227   CALCIUM 9.4 04/10/2017 1329   ALKPHOS 83 12/05/2017 1227   ALKPHOS 78 04/10/2017 1329   AST 22 12/05/2017 1227   AST 22 04/10/2017 1329   ALT 19 12/05/2017 1227   ALT 20 04/10/2017 1329   BILITOT 0.3 12/05/2017 1227   BILITOT 0.25 04/10/2017 1329       No results found for: LABCA2  No components found for: LABCA125  No results for input(s): INR in the last 168 hours.  Urinalysis    Component Value Date/Time   COLORURINE YELLOW 01/28/2012 1952   APPEARANCEUR CLEAR 01/28/2012 1952   LABSPEC 1.006 01/28/2012 1952   PHURINE 7.0 01/28/2012 1952   GLUCOSEU NEGATIVE 01/28/2012 1952   HGBUR NEGATIVE 01/28/2012 1952   BILIRUBINUR NEGATIVE 01/28/2012 [place] NEGATIVE 01/28/2012 1952   PROTEINUR NEGATIVE 01/28/2012 1952   UROBILINOGEN 0.2  01/28/2012 1952   NITRITE NEGATIVE 01/28/2012 1952   LEUKOCYTESUR TRACE (A) 01/28/2012 1952     STUDIES: Since her last visit, she underwent diagnostic bilateral mammography with CAD and tomography on 03/06/2018 at [facility] showing: breast density category C. There was no evidence of malignancy.  ELIGIBLE FOR AVAILABLE RESEARCH PROTOCOL: no  ASSESSMENT: 67 y.o. [place] woman status post right breast upper outer quadrant biopsy 02/29/2016 for a clinical T1a N0, stage IA invasive ductal carcinoma, grade 2, estrogen receptor positive, progesterone receptor positive at 2%, HER-2 nonamplified, with an MIB-1 of 10%  (1) right lumpectomy and sentinel lymph node sampling 03/24/2016 confirmed a pT1c pN0, stage IA invasive lobular carcinoma, grade 2, with negative margins; a total of 3 sentinel lymph nodes were removed.  (2) Oncotype DX score of 16 ("low risk" "), predicts a risk of outside the breast recurrence within 10 years of 10% if the patient's only systemic therapy is tamoxifen for 5 years. It also predicts no benefit from adjuvant chemotherapy.  (3) adjuvant radiation 05/04/16-06/01/16:  52.72 Gy to the right breast+boost  (4) started anastrozole 07/22/2016  (a) Vitamin D level 23.3 on 10/04/2016  (b) bone density at Dr. Ophelia Charter 2019 showed minimal change per patient report  PLAN: Jessice is now just over 2 years out from definitive surgery for her breast cancer with no evidence of disease recurrence.  This is very febrile.  She is tolerating anastrozole generally well.  She would like to consider vaginal estrogens.  This is contraindicated with aromatase inhibitors, which work by blocking estrogen production.  However if she is switched over to tamoxifen she could consider vaginal estrogens.  We went over the possible toxicity side effects and complications of tamoxifen.  At this point she prefers to continue on anastrozole.  She will see me again in 1 year.  She knows to  call for any other issues that may develop before that visit     , [name], MD  06/05/18 10:57 AM Medical Oncology and Hematology [facility] [place], [place] [address] Tel. [phone]    Fax. [phone]  Alice Rieger, am acting as scribe for Chauncey Cruel MD.  I, Lurline Del MD, have reviewed the above documentation for accuracy and completeness, and I agree with the above.

## 2018-06-05 ENCOUNTER — Telehealth: Payer: Self-pay | Admitting: Oncology

## 2018-06-05 ENCOUNTER — Inpatient Hospital Stay: Payer: 59 | Attending: Oncology | Admitting: Oncology

## 2018-06-05 ENCOUNTER — Inpatient Hospital Stay: Payer: 59

## 2018-06-05 VITALS — BP 147/83 | HR 88 | Temp 98.4°F | Resp 18 | Ht 61.0 in | Wt 132.2 lb

## 2018-06-05 DIAGNOSIS — C50411 Malignant neoplasm of upper-outer quadrant of right female breast: Secondary | ICD-10-CM | POA: Diagnosis not present

## 2018-06-05 DIAGNOSIS — Z79811 Long term (current) use of aromatase inhibitors: Secondary | ICD-10-CM | POA: Diagnosis not present

## 2018-06-05 DIAGNOSIS — Z170 Estrogen receptor positive status [ER+]: Secondary | ICD-10-CM

## 2018-06-05 LAB — CBC WITH DIFFERENTIAL/PLATELET
ABS IMMATURE GRANULOCYTES: 0.02 10*3/uL (ref 0.00–0.07)
BASOS PCT: 1 %
Basophils Absolute: 0.1 10*3/uL (ref 0.0–0.1)
Eosinophils Absolute: 0.2 10*3/uL (ref 0.0–0.5)
Eosinophils Relative: 3 %
HCT: 36.4 % (ref 36.0–46.0)
Hemoglobin: 11.3 g/dL — ABNORMAL LOW (ref 12.0–15.0)
IMMATURE GRANULOCYTES: 0 %
LYMPHS ABS: 2 10*3/uL (ref 0.7–4.0)
LYMPHS PCT: 27 %
MCH: 25.5 pg — ABNORMAL LOW (ref 26.0–34.0)
MCHC: 31 g/dL (ref 30.0–36.0)
MCV: 82 fL (ref 80.0–100.0)
MONOS PCT: 6 %
Monocytes Absolute: 0.4 10*3/uL (ref 0.1–1.0)
NEUTROS ABS: 4.5 10*3/uL (ref 1.7–7.7)
Neutrophils Relative %: 63 %
Platelets: 274 10*3/uL (ref 150–400)
RBC: 4.44 MIL/uL (ref 3.87–5.11)
RDW: 15.3 % (ref 11.5–15.5)
WBC: 7.2 10*3/uL (ref 4.0–10.5)
nRBC: 0 % (ref 0.0–0.2)

## 2018-06-05 LAB — COMPREHENSIVE METABOLIC PANEL
ALBUMIN: 3.7 g/dL (ref 3.5–5.0)
ALT: 22 U/L (ref 0–44)
AST: 23 U/L (ref 15–41)
Alkaline Phosphatase: 79 U/L (ref 38–126)
Anion gap: 9 (ref 5–15)
BUN: 7 mg/dL — ABNORMAL LOW (ref 8–23)
CHLORIDE: 107 mmol/L (ref 98–111)
CO2: 27 mmol/L (ref 22–32)
Calcium: 9 mg/dL (ref 8.9–10.3)
Creatinine, Ser: 0.74 mg/dL (ref 0.44–1.00)
GFR calc Af Amer: >60 mL/min (ref >60)
GFR calc non Af Amer: >60 mL/min (ref >60)
GLUCOSE: 142 mg/dL — AB (ref 70–99)
POTASSIUM: 3.6 mmol/L (ref 3.5–5.1)
SODIUM: 143 mmol/L (ref 135–145)
Total Bilirubin: 0.3 mg/dL (ref 0.3–1.2)
Total Protein: 6.6 g/dL (ref 6.5–8.1)

## 2018-06-05 MED ORDER — ANASTROZOLE 1 MG PO TABS: 1.0000 mg | ORAL_TABLET | Freq: Every day | ORAL | 4 refills | Status: DC

## 2018-06-05 NOTE — Telephone Encounter (Signed)
Gave avs and calendar ° °

## 2018-06-05 NOTE — Progress Notes (Shared)
Bone Density on 02/20/2018 obtained at today's visit from [facility] showed a T score of -1.6 at the right femoral neck. (osteopenia). Previous bone density on 02/03/2014 at [facility] showed a T score of -1.2.

## 2018-07-16 DIAGNOSIS — Z853 Personal history of malignant neoplasm of breast: Secondary | ICD-10-CM | POA: Diagnosis not present

## 2018-07-23 DIAGNOSIS — Z23 Encounter for immunization: Secondary | ICD-10-CM | POA: Diagnosis not present

## 2018-08-02 DIAGNOSIS — N3281 Overactive bladder: Secondary | ICD-10-CM | POA: Diagnosis not present

## 2018-08-02 DIAGNOSIS — R350 Frequency of micturition: Secondary | ICD-10-CM | POA: Diagnosis not present

## 2018-09-04 DIAGNOSIS — R350 Frequency of micturition: Secondary | ICD-10-CM | POA: Diagnosis not present

## 2018-09-04 DIAGNOSIS — N3281 Overactive bladder: Secondary | ICD-10-CM | POA: Diagnosis not present

## 2018-10-08 DIAGNOSIS — E1169 Type 2 diabetes mellitus with other specified complication: Secondary | ICD-10-CM | POA: Diagnosis not present

## 2018-10-08 DIAGNOSIS — Z7984 Long term (current) use of oral hypoglycemic drugs: Secondary | ICD-10-CM | POA: Diagnosis not present

## 2018-10-08 DIAGNOSIS — N760 Acute vaginitis: Secondary | ICD-10-CM | POA: Diagnosis not present

## 2018-10-08 DIAGNOSIS — E7800 Pure hypercholesterolemia, unspecified: Secondary | ICD-10-CM | POA: Diagnosis not present

## 2018-10-08 DIAGNOSIS — I10 Essential (primary) hypertension: Secondary | ICD-10-CM | POA: Diagnosis not present

## 2018-10-08 DIAGNOSIS — C50911 Malignant neoplasm of unspecified site of right female breast: Secondary | ICD-10-CM | POA: Diagnosis not present

## 2018-10-11 DIAGNOSIS — N3281 Overactive bladder: Secondary | ICD-10-CM | POA: Diagnosis not present

## 2018-10-11 DIAGNOSIS — N3020 Other chronic cystitis without hematuria: Secondary | ICD-10-CM | POA: Diagnosis not present

## 2018-12-31 DIAGNOSIS — Z853 Personal history of malignant neoplasm of breast: Secondary | ICD-10-CM | POA: Diagnosis not present

## 2019-01-24 ENCOUNTER — Other Ambulatory Visit: Payer: Self-pay | Admitting: Adult Health

## 2019-01-24 DIAGNOSIS — Z853 Personal history of malignant neoplasm of breast: Secondary | ICD-10-CM

## 2019-01-25 DIAGNOSIS — Z7984 Long term (current) use of oral hypoglycemic drugs: Secondary | ICD-10-CM | POA: Diagnosis not present

## 2019-01-25 DIAGNOSIS — N898 Other specified noninflammatory disorders of vagina: Secondary | ICD-10-CM | POA: Diagnosis not present

## 2019-01-25 DIAGNOSIS — B373 Candidiasis of vulva and vagina: Secondary | ICD-10-CM | POA: Diagnosis not present

## 2019-01-25 DIAGNOSIS — E1169 Type 2 diabetes mellitus with other specified complication: Secondary | ICD-10-CM | POA: Diagnosis not present

## 2019-01-29 DIAGNOSIS — R350 Frequency of micturition: Secondary | ICD-10-CM | POA: Diagnosis not present

## 2019-02-05 DIAGNOSIS — R3915 Urgency of urination: Secondary | ICD-10-CM | POA: Diagnosis not present

## 2019-02-05 DIAGNOSIS — R350 Frequency of micturition: Secondary | ICD-10-CM | POA: Diagnosis not present

## 2019-02-13 DIAGNOSIS — R350 Frequency of micturition: Secondary | ICD-10-CM | POA: Diagnosis not present

## 2019-02-13 DIAGNOSIS — R3915 Urgency of urination: Secondary | ICD-10-CM | POA: Diagnosis not present

## 2019-02-19 DIAGNOSIS — R350 Frequency of micturition: Secondary | ICD-10-CM | POA: Diagnosis not present

## 2019-02-19 DIAGNOSIS — R3915 Urgency of urination: Secondary | ICD-10-CM | POA: Diagnosis not present

## 2019-02-26 DIAGNOSIS — R350 Frequency of micturition: Secondary | ICD-10-CM | POA: Diagnosis not present

## 2019-02-26 DIAGNOSIS — R3915 Urgency of urination: Secondary | ICD-10-CM | POA: Diagnosis not present

## 2019-02-27 DIAGNOSIS — E119 Type 2 diabetes mellitus without complications: Secondary | ICD-10-CM | POA: Insufficient documentation

## 2019-03-05 DIAGNOSIS — R350 Frequency of micturition: Secondary | ICD-10-CM | POA: Diagnosis not present

## 2019-03-08 ENCOUNTER — Ambulatory Visit
Admission: RE | Admit: 2019-03-08 | Discharge: 2019-03-08 | Disposition: A | Discharge status: Home / Self Care | Payer: BLUE CROSS/BLUE SHIELD | Source: Ambulatory Visit | Attending: Adult Health | Admitting: Adult Health

## 2019-03-08 ENCOUNTER — Other Ambulatory Visit: Payer: Self-pay

## 2019-03-08 DIAGNOSIS — R922 Inconclusive mammogram: Secondary | ICD-10-CM | POA: Diagnosis not present

## 2019-03-08 DIAGNOSIS — Z853 Personal history of malignant neoplasm of breast: Secondary | ICD-10-CM | POA: Diagnosis not present

## 2019-03-08 IMAGING — MG DIGITAL DIAGNOSTIC BILATERAL MAMMOGRAM WITH TOMO AND CAD
6 of 11 series · 6 of 31 positions shown · non-contrast
Comparison: Previous exam(s).

CLINICAL DATA: History of right breast cancer status post
lumpectomy in [E3].

EXAM:
DIGITAL DIAGNOSTIC BILATERAL MAMMOGRAM WITH CAD AND TOMO

[R CC]
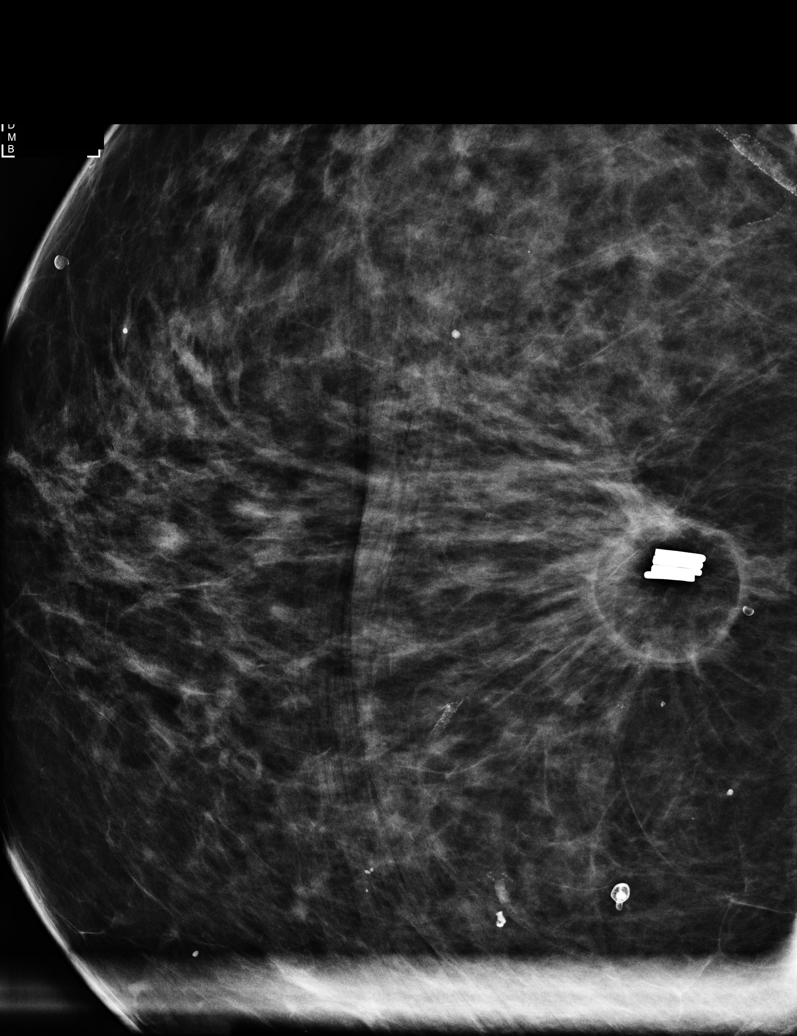

[R MLO synth-2D (1 of 2)]
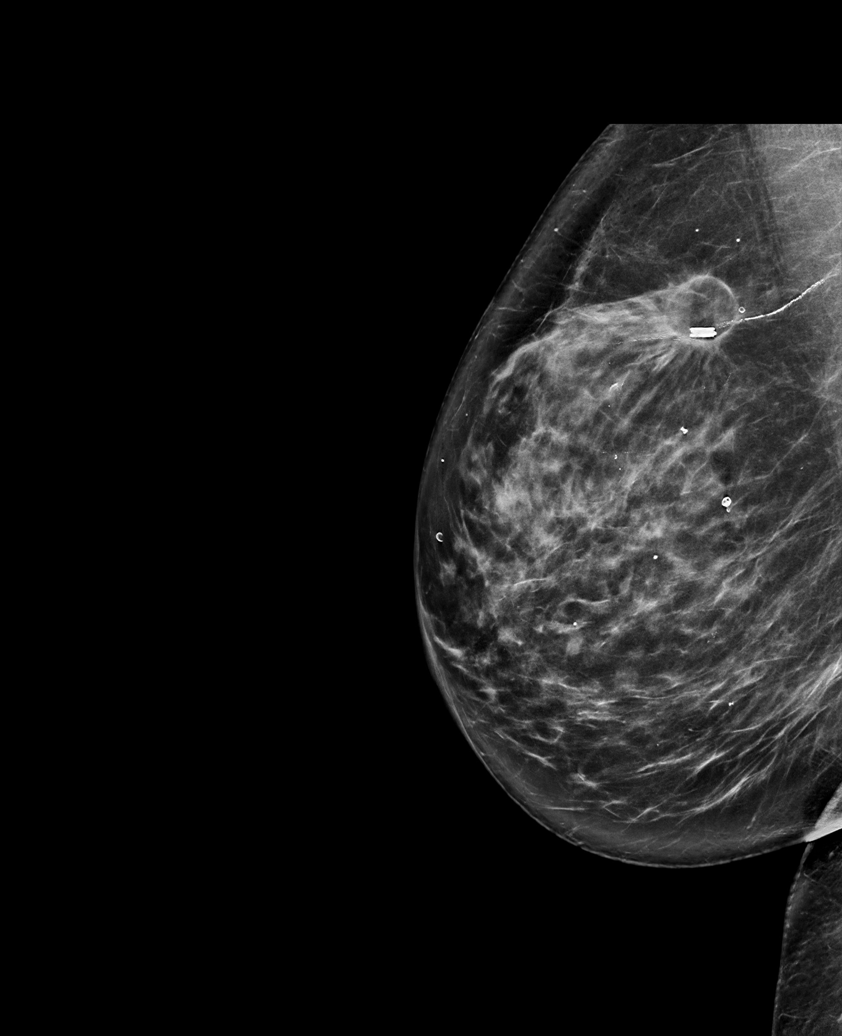

[R MLO synth-2D (2 of 2)]
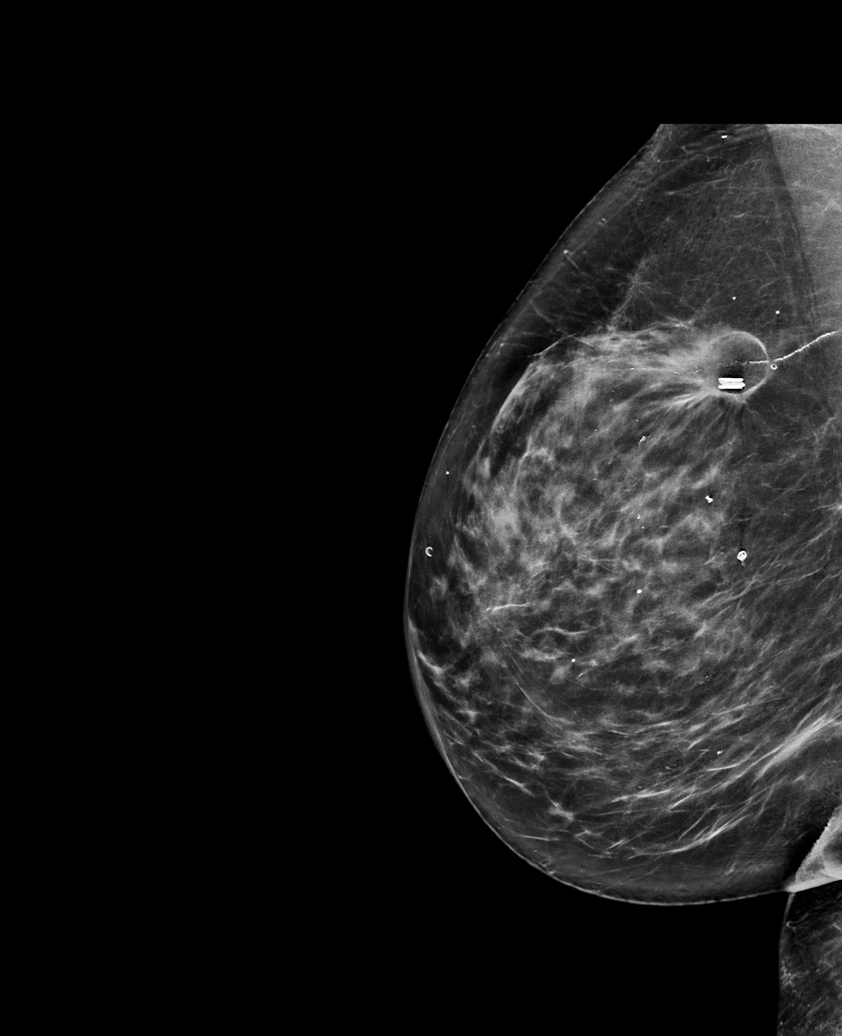

[L CC synth-2D]
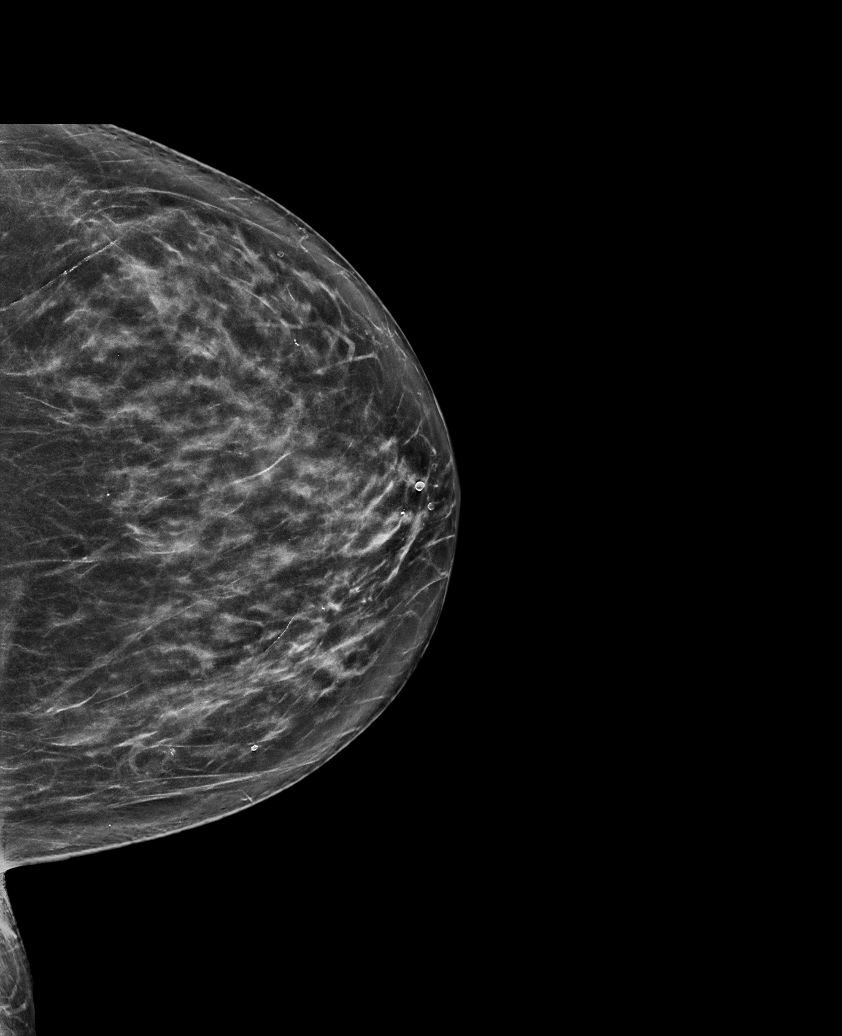

[R CC synth-2D]
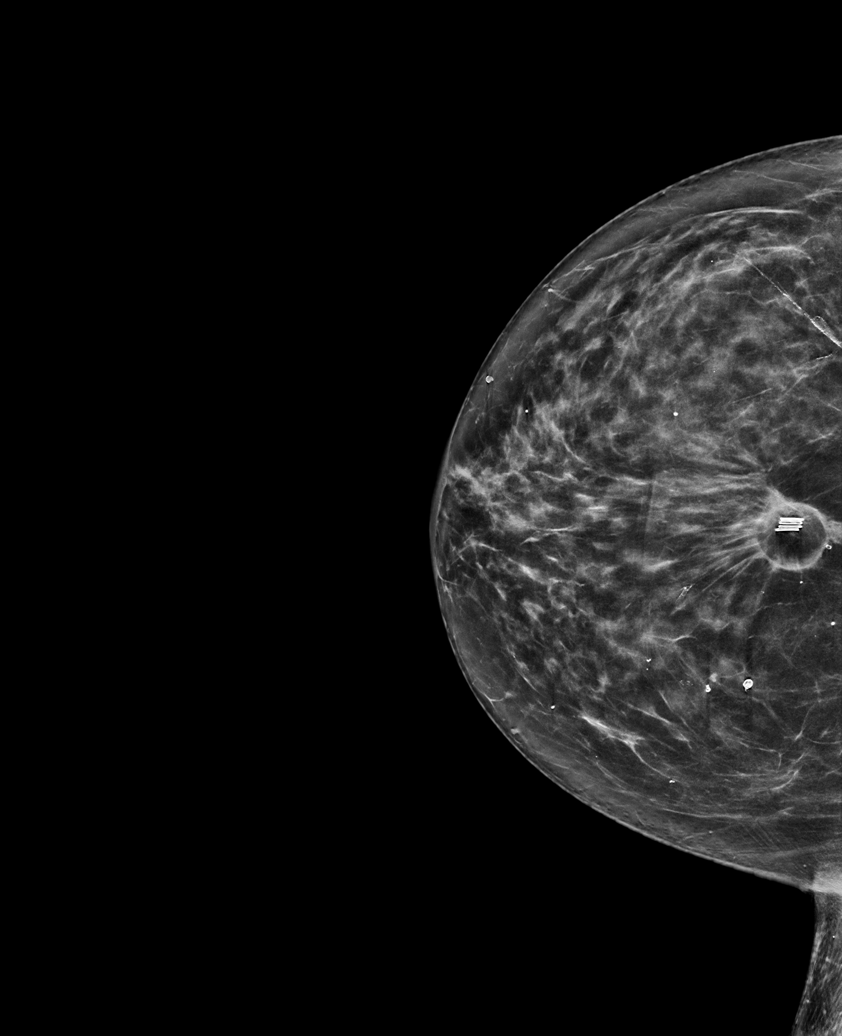

[L MLO synth-2D]
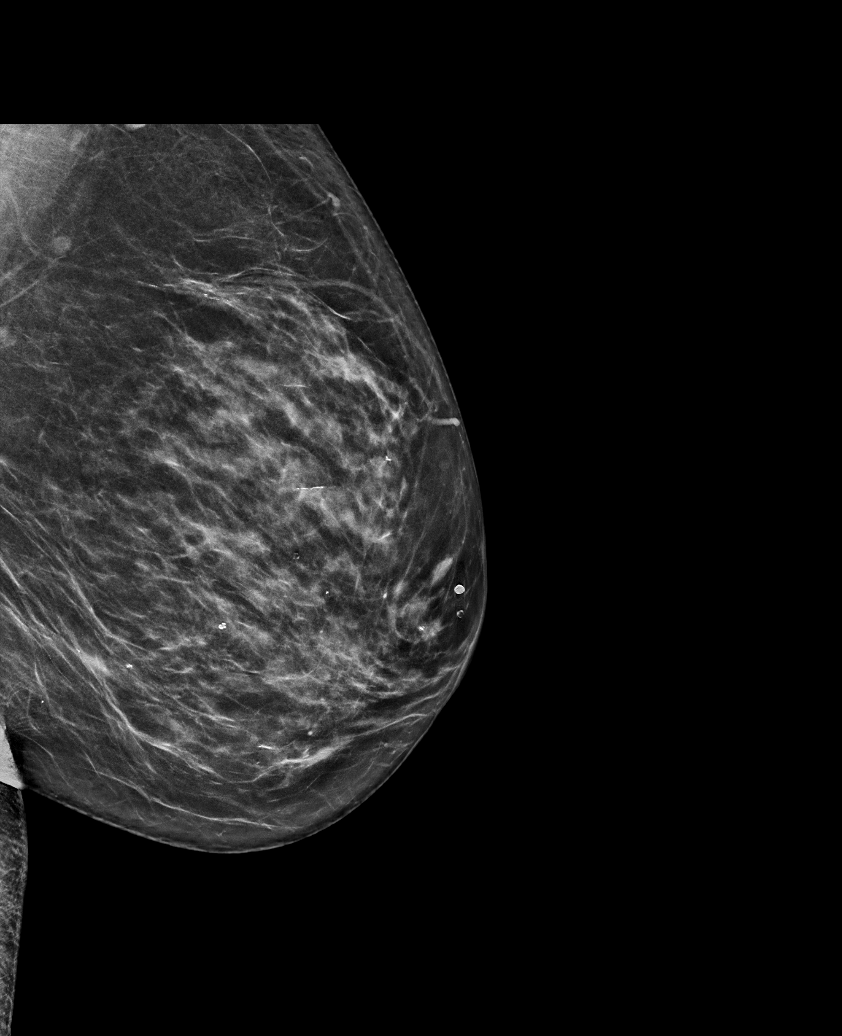

[6 of 31 positions shown; findings below may reference images not displayed]

ACR Breast Density Category c: The breast tissue is heterogeneously
dense, which may obscure small masses.
FINDINGS: Lumpectomy changes are seen in the right breast. No suspicious mass
or malignant type microcalcifications identified in either breast.

Mammographic images were processed with CAD.
IMPRESSION: No evidence of malignancy in either breast.

RECOMMENDATION:
Bilateral diagnostic mammogram in 1 year is recommended.

I have discussed the findings and recommendations with the patient.
Results were also provided in writing at the conclusion of the
visit. If applicable, a reminder letter will be sent to the patient
regarding the next appointment.

BI-RADS CATEGORY  2: Benign.

## 2019-03-11 DIAGNOSIS — Z6825 Body mass index (BMI) 25.0-25.9, adult: Secondary | ICD-10-CM | POA: Diagnosis not present

## 2019-03-11 DIAGNOSIS — Z01419 Encounter for gynecological examination (general) (routine) without abnormal findings: Secondary | ICD-10-CM | POA: Diagnosis not present

## 2019-03-12 DIAGNOSIS — R350 Frequency of micturition: Secondary | ICD-10-CM | POA: Diagnosis not present

## 2019-03-12 DIAGNOSIS — R3915 Urgency of urination: Secondary | ICD-10-CM | POA: Diagnosis not present

## 2019-03-19 DIAGNOSIS — R350 Frequency of micturition: Secondary | ICD-10-CM | POA: Diagnosis not present

## 2019-03-19 DIAGNOSIS — R3915 Urgency of urination: Secondary | ICD-10-CM | POA: Diagnosis not present

## 2019-03-26 DIAGNOSIS — R350 Frequency of micturition: Secondary | ICD-10-CM | POA: Diagnosis not present

## 2019-04-02 DIAGNOSIS — R3915 Urgency of urination: Secondary | ICD-10-CM | POA: Diagnosis not present

## 2019-04-02 DIAGNOSIS — R350 Frequency of micturition: Secondary | ICD-10-CM | POA: Diagnosis not present

## 2019-04-05 DIAGNOSIS — R350 Frequency of micturition: Secondary | ICD-10-CM | POA: Diagnosis not present

## 2019-04-05 DIAGNOSIS — R3915 Urgency of urination: Secondary | ICD-10-CM | POA: Diagnosis not present

## 2019-04-19 DIAGNOSIS — R350 Frequency of micturition: Secondary | ICD-10-CM | POA: Diagnosis not present

## 2019-05-03 DIAGNOSIS — N952 Postmenopausal atrophic vaginitis: Secondary | ICD-10-CM | POA: Diagnosis not present

## 2019-05-03 DIAGNOSIS — N904 Leukoplakia of vulva: Secondary | ICD-10-CM | POA: Diagnosis not present

## 2019-05-03 DIAGNOSIS — N760 Acute vaginitis: Secondary | ICD-10-CM | POA: Diagnosis not present

## 2019-05-10 DIAGNOSIS — R350 Frequency of micturition: Secondary | ICD-10-CM | POA: Diagnosis not present

## 2019-05-14 DIAGNOSIS — C50911 Malignant neoplasm of unspecified site of right female breast: Secondary | ICD-10-CM | POA: Diagnosis not present

## 2019-05-14 DIAGNOSIS — Z23 Encounter for immunization: Secondary | ICD-10-CM | POA: Diagnosis not present

## 2019-05-14 DIAGNOSIS — K219 Gastro-esophageal reflux disease without esophagitis: Secondary | ICD-10-CM | POA: Diagnosis not present

## 2019-05-14 DIAGNOSIS — Z0000 Encounter for general adult medical examination without abnormal findings: Secondary | ICD-10-CM | POA: Diagnosis not present

## 2019-05-14 DIAGNOSIS — I10 Essential (primary) hypertension: Secondary | ICD-10-CM | POA: Diagnosis not present

## 2019-05-14 DIAGNOSIS — N952 Postmenopausal atrophic vaginitis: Secondary | ICD-10-CM | POA: Diagnosis not present

## 2019-05-14 DIAGNOSIS — N319 Neuromuscular dysfunction of bladder, unspecified: Secondary | ICD-10-CM | POA: Diagnosis not present

## 2019-05-14 DIAGNOSIS — J309 Allergic rhinitis, unspecified: Secondary | ICD-10-CM | POA: Diagnosis not present

## 2019-05-14 DIAGNOSIS — E1169 Type 2 diabetes mellitus with other specified complication: Secondary | ICD-10-CM | POA: Diagnosis not present

## 2019-05-14 DIAGNOSIS — E7800 Pure hypercholesterolemia, unspecified: Secondary | ICD-10-CM | POA: Diagnosis not present

## 2019-05-14 DIAGNOSIS — R32 Unspecified urinary incontinence: Secondary | ICD-10-CM | POA: Diagnosis not present

## 2019-05-31 DIAGNOSIS — R350 Frequency of micturition: Secondary | ICD-10-CM | POA: Diagnosis not present

## 2019-06-06 ENCOUNTER — Inpatient Hospital Stay: Payer: Medicare Other | Attending: Oncology

## 2019-06-06 ENCOUNTER — Inpatient Hospital Stay: Payer: Medicare Other

## 2019-06-06 ENCOUNTER — Inpatient Hospital Stay (HOSPITAL_BASED_OUTPATIENT_CLINIC_OR_DEPARTMENT_OTHER): Payer: Medicare Other | Admitting: Oncology

## 2019-06-06 ENCOUNTER — Other Ambulatory Visit: Payer: Self-pay

## 2019-06-06 VITALS — BP 155/76 | HR 87 | Temp 98.3°F | Resp 17 | Ht 61.0 in | Wt 128.3 lb

## 2019-06-06 DIAGNOSIS — Z170 Estrogen receptor positive status [ER+]: Secondary | ICD-10-CM | POA: Diagnosis not present

## 2019-06-06 DIAGNOSIS — Z79811 Long term (current) use of aromatase inhibitors: Secondary | ICD-10-CM | POA: Insufficient documentation

## 2019-06-06 DIAGNOSIS — E119 Type 2 diabetes mellitus without complications: Secondary | ICD-10-CM | POA: Insufficient documentation

## 2019-06-06 DIAGNOSIS — Z79899 Other long term (current) drug therapy: Secondary | ICD-10-CM | POA: Insufficient documentation

## 2019-06-06 DIAGNOSIS — C50411 Malignant neoplasm of upper-outer quadrant of right female breast: Secondary | ICD-10-CM

## 2019-06-06 DIAGNOSIS — M8580 Other specified disorders of bone density and structure, unspecified site: Secondary | ICD-10-CM | POA: Insufficient documentation

## 2019-06-06 DIAGNOSIS — I10 Essential (primary) hypertension: Secondary | ICD-10-CM | POA: Insufficient documentation

## 2019-06-06 DIAGNOSIS — D649 Anemia, unspecified: Secondary | ICD-10-CM | POA: Insufficient documentation

## 2019-06-06 LAB — CBC WITH DIFFERENTIAL/PLATELET
Abs Immature Granulocytes: 0.02 10*3/uL (ref 0.00–0.07)
Basophils Absolute: 0.1 10*3/uL (ref 0.0–0.1)
Basophils Relative: 1 %
Eosinophils Absolute: 0.2 10*3/uL (ref 0.0–0.5)
Eosinophils Relative: 2 %
HCT: 35.2 % — ABNORMAL LOW (ref 36.0–46.0)
Hemoglobin: 10.6 g/dL — ABNORMAL LOW (ref 12.0–15.0)
Immature Granulocytes: 0 %
Lymphocytes Relative: 30 %
Lymphs Abs: 2.5 10*3/uL (ref 0.7–4.0)
MCH: 22 pg — ABNORMAL LOW (ref 26.0–34.0)
MCHC: 30.1 g/dL (ref 30.0–36.0)
MCV: 73 fL — ABNORMAL LOW (ref 80.0–100.0)
Monocytes Absolute: 0.6 10*3/uL (ref 0.1–1.0)
Monocytes Relative: 7 %
Neutro Abs: 5 10*3/uL (ref 1.7–7.7)
Neutrophils Relative %: 60 %
Platelets: 289 10*3/uL (ref 150–400)
RBC: 4.82 MIL/uL (ref 3.87–5.11)
RDW: 18.8 % — ABNORMAL HIGH (ref 11.5–15.5)
WBC: 8.4 10*3/uL (ref 4.0–10.5)
nRBC: 0 % (ref 0.0–0.2)

## 2019-06-06 LAB — COMPREHENSIVE METABOLIC PANEL
ALT: 17 U/L (ref 0–44)
AST: 19 U/L (ref 15–41)
Albumin: 4 g/dL (ref 3.5–5.0)
Alkaline Phosphatase: 83 U/L (ref 38–126)
Anion gap: 12 (ref 5–15)
BUN: 11 mg/dL (ref 8–23)
CO2: 24 mmol/L (ref 22–32)
Calcium: 9.1 mg/dL (ref 8.9–10.3)
Chloride: 106 mmol/L (ref 98–111)
Creatinine, Ser: 0.72 mg/dL (ref 0.44–1.00)
GFR calc Af Amer: >60 mL/min (ref >60)
GFR calc non Af Amer: >60 mL/min (ref >60)
Glucose, Bld: 96 mg/dL (ref 70–99)
Potassium: 3.7 mmol/L (ref 3.5–5.1)
Sodium: 142 mmol/L (ref 135–145)
Total Bilirubin: 0.3 mg/dL (ref 0.3–1.2)
Total Protein: 6.8 g/dL (ref 6.5–8.1)

## 2019-06-06 LAB — RETICULOCYTES
Immature Retic Fract: 22.3 % — ABNORMAL HIGH (ref 2.3–15.9)
RBC.: 4.83 MIL/uL (ref 3.87–5.11)
Retic Count, Absolute: 53.6 10*3/uL (ref 19.0–186.0)
Retic Ct Pct: 1.1 % (ref 0.4–3.1)

## 2019-06-06 LAB — VITAMIN B12: Vitamin B-12: <50 pg/mL — ABNORMAL LOW (ref 180–914)

## 2019-06-06 LAB — IRON AND TIBC
Iron: 14 ug/dL — ABNORMAL LOW (ref 41–142)
Saturation Ratios: 3 % — ABNORMAL LOW (ref 21–57)
TIBC: 475 ug/dL — ABNORMAL HIGH (ref 236–444)
UIBC: 461 ug/dL — ABNORMAL HIGH (ref 120–384)

## 2019-06-06 LAB — FERRITIN: Ferritin: <4 ng/mL — ABNORMAL LOW (ref 11–307)

## 2019-06-06 LAB — FOLATE: Folate: 21.6 ng/mL (ref >5.9)

## 2019-06-06 LAB — SAVE SMEAR(SSMR), FOR PROVIDER SLIDE REVIEW

## 2019-06-06 NOTE — Progress Notes (Signed)
[facility]  [phone] [phone]     ID: Meghan Padilla DOB: 1951/05/02  MR#: [id number]  GGE#:[id number]  Patient Care Team: [name], MD as PCP - General (Family Medicine) Zadkiel Dragan, [name], MD as Consulting Physician (Oncology) Erroll Luna, MD as Consulting Physician (General Surgery) Gery Pray, MD as Consulting Physician (Radiation Oncology) Arvella Nigh, MD (Obstetrics and Gynecology) Franchot Gallo, MD as Consulting Physician (Urology) Wilford Corner, MD as Consulting Physician (Gastroenterology) Delice Bison, [name], NP as Nurse Practitioner (Hematology and Oncology) OTHER MD:  CHIEF COMPLAINT: Estrogen receptor positive breast cancer  CURRENT TREATMENT: Anastrozole   BREAST CANCER HISTORY: From the original intake note:  [facility]eghan Pad[facility] showing a possible right breast distortion and or mass and possibly a right axillary lymph node. She was recalled for diagnostic right mammography with tomography and ultrasonography at [facility] 02/24/2016. This found her right breast density to be category C. In the upper right breast there was a site of distortion. The mass of concern was consistent with an intramamm [name]. On exam there was no palpable abnormality. Ultrasonography found a hypoechoic irregular mass with spiculated margins at the 12:00 position of the right breast measuring 0.5 cm. Ultrasound of the right axilla showed multiple normal-appearing lymph nodes.  Biopsy of the right breast mass in question 02/29/2016 showed orifices [id number]) and invasive ductal carcinoma, grade 2, estrogen receptor 95% positive, with strong staining intensity, progesterone receptor 2% positive, with strong staining intensity, with an MIB-1 of 10%, and no HER-2 amplification, the signals ratio being 1.48 and the number per cell 2.30.  Her  subsequent history is as detailed below.   INTERVAL HISTORY: Meghan Padilla returns today for follow-up and treatment of her estrogen receptor positive breast cancer. She was last seen here on 06/05/2018.   She continues on anastrozole.  She tolerates this generally well.  She has occasional hot flashes, minimal although persistent problems with vaginal dryness.  She is aware of our pelvic rehab program but has not opted to participate  Meghan Padilla's last bone density screening on 02/20/2018, showed a T-score of -1.6, which is considered osteopenic.    Since her last visit here, she underwent digital diagnostic bilateral mammogram with tomography on 03/08/2019 showing: Breast Density Category C. There is no mammographic evidence of malignancy.    REVIEW OF SYSTEMS: Meghan Padilla denies any change in bowel or bladder habits and particularly no bright red blood per rectum or melena.  She has no symptoms of gastritis or reflux.  She has not lost weight.  There has been no bleeding or bruising that she is aware of.  A detailed review of systems today was otherwise stable.    PAST MEDICAL HISTORY: Past Medical History:  Diagnosis Date  . Breast cancer ([place])   . Cancer [facility]) 2017   right breast  . Diabetes mellitus   . GERD (gastroesophageal reflux disease)   . History of radiation therapy 05/04/16-06/01/16   right breast 42.72 Gy boost to 10 Gy  . Hyperlipidemia   . Hypertension   . Personal history of radiation therapy 05/05/2016   ended radiaiton 06-01-2016  . UTI (urinary tract infection)     PAST SURGICAL HISTORY: Past Surgical History:  Procedure Laterality Date  . BREAST BIOPSY    . BREAST LUMPECTOMY     right 03/2016  . DILATION AND CURETTAGE OF UTERUS    . HAND SURGERY    .  HAND SURGERY    . HYSTEROSCOPY W/D&C  11/28/2008  . RADIOACTIVE SEED GUIDED PARTIAL MASTECTOMY WITH AXILLARY SENTINEL LYMPH NODE BIOPSY Right 03/24/2016   Procedure: RADIOACTIVE SEED GUIDED PARTIAL MASTECTOMY WITH  AXILLARY SENTINEL LYMPH NODE BIOPSY;  Surgeon: Erroll Luna, MD;  Location: [facility];  Service: General;  Laterality: Right;  . TONSILLECTOMY      FAMILY HISTORY Family History  Problem Relation Age of Onset  . Cancer Neg Hx    The patient's parents are still living, in their late 44s. The patient had no brothers, 2 sisters. There is no history of breast or ovarian or any other cancer in the family to the patient's knowledge   GYNECOLOGIC HISTORY:  No LMP recorded. Patient is postmenopausal.  menarche age 40, first live birth age 82. The patient went through menopause in her early 70s. She did not take hormone replacement. 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 Her husband Meghan Padilla was a Dealer at [employer]. He is retired and disabled after multiple back surgeries. Daughter Meghan Padilla lives in [place] and is disabled secondary to cognitive and behavioral issues. Son Meghan Padilla lives in [place] where he does car painting and body work.  He recently married into the [place] family and the patient now has a step 45 year old granddaughter that she is helping during the virtual school months.  She is a Tourist information centre manager.    ADVANCED DIRECTIVES: Not in place    HEALTH MAINTENANCE: Social History   Tobacco Use  . Smoking status: Never Smoker  . Smokeless tobacco: Never Used  Substance Use Topics  . Alcohol use: No  . Drug use: No     Colonoscopy: Dr. Michail Sermon  PAP:  Bone density:   Allergies  Allergen Reactions  . Sulfa Antibiotics Nausea Only    Current Outpatient Medications  Medication Sig Dispense Refill  . anastrozole (ARIMIDEX) 1 MG tablet Take 1 tablet (1 mg total) by mouth daily. 90 tablet 4  . atorvastatin (LIPITOR) 40 MG tablet Take 40 mg by mouth daily.    . cholecalciferol (VITAMIN D) 1000 units tablet Take 1  tablet (1,000 Units total) by mouth daily. 100 tablet 4  . hydrOXYzine (ATARAX/VISTARIL) 10 MG tablet TAKE 1 TABLET BY MOUTH EVERYDAY AT BEDTIME  6  . metFORMIN (GLUCOPHAGE) 500 MG tablet Take 4 tablets (2,000 mg total) by mouth at bedtime.    Marland Kitchen omeprazole (PRILOSEC) 20 MG capsule Take 20 mg by mouth daily.    . Probiotic Product (PROBIOTIC DAILY) CAPS Take by mouth.    . Pumpkin Seed-Soy Germ (AZO BLADDER CONTROL/GO-LESS) CAPS Take by mouth.    . quinapril (ACCUPRIL) 20 MG tablet Take 20 mg by mouth at bedtime.    . tolterodine (DETROL LA) 4 MG 24 hr capsule Take 4 mg by mouth daily.     No current facility-administered medications for this visit.     OBJECTIVE: Middle-aged white woman in no acute distress  Vitals:   06/06/19 1130  BP: (!) 155/76  Pulse: 87  Resp: 17  Temp: 98.3 F (36.8 C)  SpO2: 100%   Wt Readings from Last 3 Encounters:  06/06/19 128 lb 4.8 oz (58.2 kg)  06/05/18 132 lb 3.2 oz (60 kg)  12/05/17 133 lb 1.6 oz (60.4 kg)   Body mass index is 24.24 kg/m.    ECOG FS:1 -  Symptomatic but completely ambulatory  Ocular: Sclerae unicteric, pupils round and equal Ear-nose-throat: Wearing a mask Lymphatic: No cervical or supraclavicular adenopathy Lungs no rales or rhonchi Heart regular rate and rhythm Abd soft, nontender, positive bowel sounds MSK no focal spinal tenderness, no joint edema Neuro: non-focal, well-oriented, appropriate affect Breasts: Right breast is status post lumpectomy and radiation.  There is no evidence of disease recurrence.  Left breast is benign.  Both axillae are benign.   LAB RESULTS:  CMP     Component Value Date/Time   NA 143 06/05/2018 1021   NA 142 04/10/2017 1329   K 3.6 06/05/2018 1021   K 3.5 04/10/2017 1329   CL 107 06/05/2018 1021   CO2 27 06/05/2018 1021   CO2 31 (H) 04/10/2017 1329   GLUCOSE 142 (H) 06/05/2018 1021   GLUCOSE 174 (H) 04/10/2017 1329   BUN 7 (L) 06/05/2018 1021   BUN 7.3 04/10/2017 1329    CREATININE 0.74 06/05/2018 1021   CREATININE 0.8 04/10/2017 1329   CALCIUM 9.0 06/05/2018 1021   CALCIUM 9.4 04/10/2017 1329   PROT 6.6 06/05/2018 1021   PROT 6.4 04/10/2017 1329   ALBUMIN 3.7 06/05/2018 1021   ALBUMIN 3.6 04/10/2017 1329   AST 23 06/05/2018 1021   AST 22 04/10/2017 1329   ALT 22 06/05/2018 1021   ALT 20 04/10/2017 1329   ALKPHOS 79 06/05/2018 1021   ALKPHOS 78 04/10/2017 1329   BILITOT 0.3 06/05/2018 1021   BILITOT 0.25 04/10/2017 1329   GFRNONAA >60 06/05/2018 1021   GFRAA >60 06/05/2018 1021    INo results found for: SPEP, UPEP  Lab Results  Component Value Date   WBC 8.4 06/06/2019   NEUTROABS 5.0 06/06/2019   HGB 10.6 (L) 06/06/2019   HCT 35.2 (L) 06/06/2019   MCV 73.0 (L) 06/06/2019   PLT 289 06/06/2019      Chemistry      Component Value Date/Time   NA 143 06/05/2018 1021   NA 142 04/10/2017 1329   K 3.6 06/05/2018 1021   K 3.5 04/10/2017 1329   CL 107 06/05/2018 1021   CO2 27 06/05/2018 1021   CO2 31 (H) 04/10/2017 1329   BUN 7 (L) 06/05/2018 1021   BUN 7.3 04/10/2017 1329   CREATININE 0.74 06/05/2018 1021   CREATININE 0.8 04/10/2017 1329      Component Value Date/Time   CALCIUM 9.0 06/05/2018 1021   CALCIUM 9.4 04/10/2017 1329   ALKPHOS 79 06/05/2018 1021   ALKPHOS 78 04/10/2017 1329   AST 23 06/05/2018 1021   AST 22 04/10/2017 1329   ALT 22 06/05/2018 1021   ALT 20 04/10/2017 1329   BILITOT 0.3 06/05/2018 1021   BILITOT 0.25 04/10/2017 1329       No results found for: LABCA2  No components found for: LABCA125  No results for input(s): INR in the last 168 hours.  Urinalysis    Component Value Date/Time   COLORURINE YELLOW 01/28/2012 1952   APPEARANCEUR CLEAR 01/28/2012 1952   LABSPEC 1.006 01/28/2012 1952   PHURINE 7.0 01/28/2012 1952   GLUCOSEU NEGATIVE 01/28/2012 1952   HGBUR NEGATIVE 01/28/2012 1952   BILIRUBINUR NEGATIVE 01/28/2012 1952   KETONESUR NEGATIVE 01/28/2012 1952   PROTEINUR NEGATIVE 01/28/2012 1952    UROBILINOGEN 0.2 01/28/2012 1952   NITRITE NEGATIVE 01/28/2012 1952   LEUKOCYTESUR TRACE (A) 01/28/2012 1952     STUDIES: No results found.   ELIGIBLE FOR AVAILABLE RESEARCH PROTOCOL: no  ASSESSMENT: 68 y.o. [employer]  woman status post right breast upper outer quadrant biopsy 02/29/2016 for a clinical T1a N0, stage IA invasive ductal carcinoma, grade 2, estrogen receptor positive, progesterone receptor positive at 2%, HER-2 nonamplified, with an MIB-1 of 10%  (1) right lumpectomy and sentinel lymph node sampling 03/24/2016 confirmed a pT1c pN0, stage IA invasive lobular carcinoma, grade 2, with negative margins; a total of 3 sentinel lymph nodes were removed.  (2) Oncotype DX score of 16 ("low risk" "), predicts a risk of outside the breast recurrence within 10 years of 10% if the patient's only systemic therapy is tamoxifen for 5 years. It also predicts no benefit from adjuvant chemotherapy.  (3) adjuvant radiation 05/04/16-06/01/16:  52.72 Gy to the right breast+boost  (4) started anastrozole 07/22/2016  (a) Vitamin D level 23.3 on 10/04/2016  (b) bone density 02/20/2018 at Dr. Ophelia Charter 2019 showed a T score of -1.6 this is stable).  PLAN: Meghan Padilla is now a little over 3 years out from definitive surgery for her breast cancer with no evidence of disease recurrence.  This is very favorable.  She is tolerating anastrozole well and the plan is to continue that for a minimum of 5 years.  She is more anemic today than she has been in the past and her MCV also has dropped to 73 from a normal of 85 2 years ago.  The most likely cause for this is iron deficiency and we are obtaining the appropriate labs today to confirm.  She understands that if she is iron deficient the most likely reason is GI related and we will refer her back to Dr. Michail Sermon for appropriate work-up.  I have asked her not to initiate iron replacement until that work-up is complete  Otherwise she will return to see me in 1  year.  She knows to call for any other issue that may develop before that visit.   Tamikia Chowning, [name], MD  06/06/19 11:33 AM Medical Oncology and Hematology [facility] [place], [place] [address] Tel. [phone]    Fax. [phone]  I, Jacqualyn Posey am acting as a Education administrator for Chauncey Cruel, MD.   I, Lurline Del MD, have reviewed the above documentation for accuracy and completeness, and I agree with the above.

## 2019-06-07 ENCOUNTER — Telehealth: Payer: Self-pay | Admitting: Oncology

## 2019-06-07 NOTE — Telephone Encounter (Signed)
I talk with patient regarding schedule  

## 2019-06-13 ENCOUNTER — Other Ambulatory Visit: Payer: Self-pay | Admitting: Oncology

## 2019-06-13 ENCOUNTER — Encounter: Payer: Self-pay | Admitting: Oncology

## 2019-06-13 NOTE — Progress Notes (Signed)
I called the niece and left her a message explaining that she is indeed iron deficient and that I I am referring her back to Dr. Michail Sermon for work-up.  I also offered her Feraheme if she is interested in replacement at this point

## 2019-06-14 ENCOUNTER — Other Ambulatory Visit: Payer: Self-pay | Admitting: Oncology

## 2019-06-14 ENCOUNTER — Telehealth: Payer: Self-pay | Admitting: Oncology

## 2019-06-14 DIAGNOSIS — D508 Other iron deficiency anemias: Secondary | ICD-10-CM

## 2019-06-14 DIAGNOSIS — C50411 Malignant neoplasm of upper-outer quadrant of right female breast: Secondary | ICD-10-CM

## 2019-06-14 DIAGNOSIS — Z170 Estrogen receptor positive status [ER+]: Secondary | ICD-10-CM

## 2019-06-14 DIAGNOSIS — D649 Anemia, unspecified: Secondary | ICD-10-CM | POA: Insufficient documentation

## 2019-06-14 DIAGNOSIS — E538 Deficiency of other specified B group vitamins: Secondary | ICD-10-CM

## 2019-06-14 NOTE — Telephone Encounter (Signed)
Scheduled appt per 10/23 sch message - pt aware of appt date and time

## 2019-06-14 NOTE — Progress Notes (Signed)
Meghan Padilla agreed to [place] and we have entered the orders.  She also had a very low B12 level.  I am going to try to confirm that with a methylmalonic acid and some antibody tests.  If she is also B12 deficient of course we will need to correct that also.

## 2019-06-19 ENCOUNTER — Inpatient Hospital Stay: Payer: Medicare Other

## 2019-06-19 ENCOUNTER — Other Ambulatory Visit: Payer: Self-pay

## 2019-06-19 VITALS — BP 147/63 | HR 73 | Temp 98.8°F | Resp 16

## 2019-06-19 DIAGNOSIS — E119 Type 2 diabetes mellitus without complications: Secondary | ICD-10-CM | POA: Diagnosis not present

## 2019-06-19 DIAGNOSIS — E538 Deficiency of other specified B group vitamins: Secondary | ICD-10-CM

## 2019-06-19 DIAGNOSIS — D508 Other iron deficiency anemias: Secondary | ICD-10-CM

## 2019-06-19 DIAGNOSIS — C50411 Malignant neoplasm of upper-outer quadrant of right female breast: Secondary | ICD-10-CM | POA: Diagnosis not present

## 2019-06-19 DIAGNOSIS — Z79811 Long term (current) use of aromatase inhibitors: Secondary | ICD-10-CM | POA: Diagnosis not present

## 2019-06-19 DIAGNOSIS — I10 Essential (primary) hypertension: Secondary | ICD-10-CM | POA: Diagnosis not present

## 2019-06-19 DIAGNOSIS — Z170 Estrogen receptor positive status [ER+]: Secondary | ICD-10-CM

## 2019-06-19 DIAGNOSIS — D649 Anemia, unspecified: Secondary | ICD-10-CM | POA: Diagnosis not present

## 2019-06-19 LAB — CBC WITH DIFFERENTIAL/PLATELET
Abs Immature Granulocytes: 0.01 10*3/uL (ref 0.00–0.07)
Basophils Absolute: 0.1 10*3/uL (ref 0.0–0.1)
Basophils Relative: 1 %
Eosinophils Absolute: 0.3 10*3/uL (ref 0.0–0.5)
Eosinophils Relative: 4 %
HCT: 36 % (ref 36.0–46.0)
Hemoglobin: 10.9 g/dL — ABNORMAL LOW (ref 12.0–15.0)
Immature Granulocytes: 0 %
Lymphocytes Relative: 24 %
Lymphs Abs: 1.8 10*3/uL (ref 0.7–4.0)
MCH: 22.5 pg — ABNORMAL LOW (ref 26.0–34.0)
MCHC: 30.3 g/dL (ref 30.0–36.0)
MCV: 74.4 fL — ABNORMAL LOW (ref 80.0–100.0)
Monocytes Absolute: 0.5 10*3/uL (ref 0.1–1.0)
Monocytes Relative: 6 %
Neutro Abs: 4.9 10*3/uL (ref 1.7–7.7)
Neutrophils Relative %: 65 %
Platelets: 322 10*3/uL (ref 150–400)
RBC: 4.84 MIL/uL (ref 3.87–5.11)
RDW: 19.1 % — ABNORMAL HIGH (ref 11.5–15.5)
WBC: 7.6 10*3/uL (ref 4.0–10.5)
nRBC: 0 % (ref 0.0–0.2)

## 2019-06-19 MED ORDER — SODIUM CHLORIDE 0.9 % IV SOLN
510.0000 mg | Freq: Once | INTRAVENOUS | Status: AC
  Administered 2019-06-19: 10:00:00 510 mg via INTRAVENOUS
  Filled 2019-06-19: qty 510

## 2019-06-19 MED ORDER — SODIUM CHLORIDE 0.9 % IV SOLN
INTRAVENOUS | Status: DC
  Administered 2019-06-19: 09:00:00 via INTRAVENOUS
  Filled 2019-06-19: qty 250

## 2019-06-19 MED ORDER — CYANOCOBALAMIN 1000 MCG/ML IJ SOLN
1000.0000 ug | Freq: Once | INTRAMUSCULAR | Status: AC
  Administered 2019-06-19: 1000 ug via INTRAMUSCULAR

## 2019-06-19 MED ORDER — CYANOCOBALAMIN 1000 MCG/ML IJ SOLN
INTRAMUSCULAR | Status: AC
  Filled 2019-06-19: qty 1

## 2019-06-19 NOTE — Patient Instructions (Signed)

## 2019-06-20 LAB — INTRINSIC FACTOR ANTIBODIES: Intrinsic Factor: 1 AU/mL (ref 0.0–1.1)

## 2019-06-20 LAB — ANTI-PARIETAL ANTIBODY: Parietal Cell Antibody-IgG: 7.5 Units (ref 0.0–20.0)

## 2019-06-21 DIAGNOSIS — R350 Frequency of micturition: Secondary | ICD-10-CM | POA: Diagnosis not present

## 2019-06-23 LAB — METHYLMALONIC ACID, SERUM: Methylmalonic Acid, Quantitative: 188 nmol/L (ref 0–378)

## 2019-06-26 ENCOUNTER — Other Ambulatory Visit: Payer: Self-pay | Admitting: Oncology

## 2019-06-26 ENCOUNTER — Other Ambulatory Visit: Payer: Self-pay

## 2019-06-26 ENCOUNTER — Inpatient Hospital Stay: Payer: Medicare Other | Attending: Oncology

## 2019-06-26 ENCOUNTER — Encounter: Payer: Self-pay | Admitting: Oncology

## 2019-06-26 VITALS — BP 147/76 | HR 83 | Temp 97.9°F | Resp 18

## 2019-06-26 DIAGNOSIS — D508 Other iron deficiency anemias: Secondary | ICD-10-CM

## 2019-06-26 DIAGNOSIS — E538 Deficiency of other specified B group vitamins: Secondary | ICD-10-CM

## 2019-06-26 DIAGNOSIS — C50411 Malignant neoplasm of upper-outer quadrant of right female breast: Secondary | ICD-10-CM | POA: Insufficient documentation

## 2019-06-26 DIAGNOSIS — D509 Iron deficiency anemia, unspecified: Secondary | ICD-10-CM | POA: Diagnosis not present

## 2019-06-26 DIAGNOSIS — Z170 Estrogen receptor positive status [ER+]: Secondary | ICD-10-CM | POA: Diagnosis not present

## 2019-06-26 DIAGNOSIS — D528 Other folate deficiency anemias: Secondary | ICD-10-CM

## 2019-06-26 MED ORDER — SODIUM CHLORIDE 0.9 % IV SOLN
510.0000 mg | Freq: Once | INTRAVENOUS | Status: AC
  Administered 2019-06-26: 510 mg via INTRAVENOUS
  Filled 2019-06-26: qty 510

## 2019-06-26 MED ORDER — SODIUM CHLORIDE 0.9 % IV SOLN
Freq: Once | INTRAVENOUS | Status: AC
  Administered 2019-06-26: 09:00:00 via INTRAVENOUS
  Filled 2019-06-26: qty 250

## 2019-06-26 NOTE — Patient Instructions (Signed)

## 2019-06-27 ENCOUNTER — Telehealth: Payer: Self-pay | Admitting: Oncology

## 2019-06-27 NOTE — Telephone Encounter (Signed)
Scheduled appt per 11/4 sch message - unable to reach pt .unable to leave message . Mailed letter with appt

## 2019-07-05 DIAGNOSIS — D509 Iron deficiency anemia, unspecified: Secondary | ICD-10-CM | POA: Diagnosis not present

## 2019-07-12 DIAGNOSIS — R350 Frequency of micturition: Secondary | ICD-10-CM | POA: Diagnosis not present

## 2019-07-22 DIAGNOSIS — N3281 Overactive bladder: Secondary | ICD-10-CM | POA: Diagnosis not present

## 2019-07-23 DIAGNOSIS — Z1159 Encounter for screening for other viral diseases: Secondary | ICD-10-CM | POA: Diagnosis not present

## 2019-07-26 DIAGNOSIS — K640 First degree hemorrhoids: Secondary | ICD-10-CM | POA: Diagnosis not present

## 2019-07-26 DIAGNOSIS — K2970 Gastritis, unspecified, without bleeding: Secondary | ICD-10-CM | POA: Diagnosis not present

## 2019-07-26 DIAGNOSIS — K635 Polyp of colon: Secondary | ICD-10-CM | POA: Diagnosis not present

## 2019-07-26 DIAGNOSIS — D509 Iron deficiency anemia, unspecified: Secondary | ICD-10-CM | POA: Diagnosis not present

## 2019-07-26 DIAGNOSIS — K319 Disease of stomach and duodenum, unspecified: Secondary | ICD-10-CM | POA: Diagnosis not present

## 2019-07-31 DIAGNOSIS — K319 Disease of stomach and duodenum, unspecified: Secondary | ICD-10-CM | POA: Diagnosis not present

## 2019-07-31 DIAGNOSIS — K635 Polyp of colon: Secondary | ICD-10-CM | POA: Diagnosis not present

## 2019-08-06 ENCOUNTER — Other Ambulatory Visit: Payer: Self-pay

## 2019-08-06 ENCOUNTER — Inpatient Hospital Stay: Payer: Medicare Other | Attending: Oncology

## 2019-08-06 DIAGNOSIS — C50411 Malignant neoplasm of upper-outer quadrant of right female breast: Secondary | ICD-10-CM | POA: Insufficient documentation

## 2019-08-06 DIAGNOSIS — E538 Deficiency of other specified B group vitamins: Secondary | ICD-10-CM

## 2019-08-06 DIAGNOSIS — Z170 Estrogen receptor positive status [ER+]: Secondary | ICD-10-CM | POA: Insufficient documentation

## 2019-08-06 DIAGNOSIS — D528 Other folate deficiency anemias: Secondary | ICD-10-CM

## 2019-08-06 LAB — CBC WITH DIFFERENTIAL/PLATELET
Abs Immature Granulocytes: 0.01 10*3/uL (ref 0.00–0.07)
Basophils Absolute: 0.1 10*3/uL (ref 0.0–0.1)
Basophils Relative: 1 %
Eosinophils Absolute: 0.2 10*3/uL (ref 0.0–0.5)
Eosinophils Relative: 3 %
HCT: 44.4 % (ref 36.0–46.0)
Hemoglobin: 14.2 g/dL (ref 12.0–15.0)
Immature Granulocytes: 0 %
Lymphocytes Relative: 27 %
Lymphs Abs: 2 10*3/uL (ref 0.7–4.0)
MCH: 27.1 pg (ref 26.0–34.0)
MCHC: 32 g/dL (ref 30.0–36.0)
MCV: 84.7 fL (ref 80.0–100.0)
Monocytes Absolute: 0.5 10*3/uL (ref 0.1–1.0)
Monocytes Relative: 7 %
Neutro Abs: 4.6 10*3/uL (ref 1.7–7.7)
Neutrophils Relative %: 62 %
Platelets: 256 10*3/uL (ref 150–400)
RBC: 5.24 MIL/uL — ABNORMAL HIGH (ref 3.87–5.11)
WBC: 7.4 10*3/uL (ref 4.0–10.5)
nRBC: 0 % (ref 0.0–0.2)

## 2019-08-06 LAB — FOLATE: Folate: 17.2 ng/mL (ref >5.9)

## 2019-08-06 LAB — VITAMIN B12: Vitamin B-12: 117 pg/mL — ABNORMAL LOW (ref 180–914)

## 2019-08-06 LAB — FERRITIN: Ferritin: 172 ng/mL (ref 11–307)

## 2019-08-10 LAB — METHYLMALONIC ACID, SERUM: Methylmalonic Acid, Quantitative: 248 nmol/L (ref 0–378)

## 2019-09-05 DIAGNOSIS — R3915 Urgency of urination: Secondary | ICD-10-CM | POA: Diagnosis not present

## 2019-09-05 DIAGNOSIS — R350 Frequency of micturition: Secondary | ICD-10-CM | POA: Diagnosis not present

## 2019-10-03 DIAGNOSIS — R350 Frequency of micturition: Secondary | ICD-10-CM | POA: Diagnosis not present

## 2019-10-03 DIAGNOSIS — R3915 Urgency of urination: Secondary | ICD-10-CM | POA: Diagnosis not present

## 2019-10-12 ENCOUNTER — Ambulatory Visit: Payer: Medicare Other | Attending: Internal Medicine

## 2019-10-12 DIAGNOSIS — Z23 Encounter for immunization: Secondary | ICD-10-CM

## 2019-10-12 NOTE — Progress Notes (Signed)
   Covid-19 Vaccination Clinic  Name:  Meghan Padilla    MRN: [id number] DOB: 25-Oct-1950  10/12/2019  [name] was observed post Covid-19 immunization for 15 minutes without incidence. She was provided with Vaccine Information Sheet and instruction to access the V-Safe system.   [name] was instructed to call 911 with any severe reactions post vaccine: Marland Kitchen Difficulty breathing  . Swelling of your face and throat  . A fast heartbeat  . A bad rash all over your body  . Dizziness and weakness    Immunizations Administered    Name Date Dose VIS Date Route   Pfizer COVID-19 Vaccine 10/12/2019  2:39 PM 0.3 mL 08/02/2019 Intramuscular   Manufacturer: [place]   Lot: [id number]   [place]: [id number]

## 2019-10-14 DIAGNOSIS — Z7984 Long term (current) use of oral hypoglycemic drugs: Secondary | ICD-10-CM | POA: Diagnosis not present

## 2019-10-14 DIAGNOSIS — N952 Postmenopausal atrophic vaginitis: Secondary | ICD-10-CM | POA: Diagnosis not present

## 2019-10-14 DIAGNOSIS — L298 Other pruritus: Secondary | ICD-10-CM | POA: Diagnosis not present

## 2019-10-14 DIAGNOSIS — E1169 Type 2 diabetes mellitus with other specified complication: Secondary | ICD-10-CM | POA: Diagnosis not present

## 2019-10-18 ENCOUNTER — Other Ambulatory Visit: Payer: Self-pay | Admitting: Oncology

## 2019-10-29 ENCOUNTER — Telehealth: Payer: Self-pay | Admitting: *Deleted

## 2019-10-29 ENCOUNTER — Other Ambulatory Visit: Payer: Self-pay

## 2019-10-29 ENCOUNTER — Inpatient Hospital Stay: Payer: Medicare Other | Attending: Oncology | Admitting: Adult Health

## 2019-10-29 ENCOUNTER — Encounter: Payer: Self-pay | Admitting: Adult Health

## 2019-10-29 VITALS — BP 133/73 | HR 98 | Temp 99.2°F | Resp 18 | Ht 61.0 in | Wt 131.3 lb

## 2019-10-29 DIAGNOSIS — C50411 Malignant neoplasm of upper-outer quadrant of right female breast: Secondary | ICD-10-CM | POA: Diagnosis not present

## 2019-10-29 DIAGNOSIS — Z79811 Long term (current) use of aromatase inhibitors: Secondary | ICD-10-CM | POA: Insufficient documentation

## 2019-10-29 DIAGNOSIS — E119 Type 2 diabetes mellitus without complications: Secondary | ICD-10-CM | POA: Diagnosis not present

## 2019-10-29 DIAGNOSIS — Z170 Estrogen receptor positive status [ER+]: Secondary | ICD-10-CM

## 2019-10-29 DIAGNOSIS — I10 Essential (primary) hypertension: Secondary | ICD-10-CM | POA: Diagnosis not present

## 2019-10-29 DIAGNOSIS — Z79899 Other long term (current) drug therapy: Secondary | ICD-10-CM | POA: Insufficient documentation

## 2019-10-29 DIAGNOSIS — N6310 Unspecified lump in the right breast, unspecified quadrant: Secondary | ICD-10-CM | POA: Diagnosis not present

## 2019-10-29 NOTE — Telephone Encounter (Signed)
Spoke with Benjamine Mola from the [facility], pt is on list to call to schedule mammogram and ultrasound

## 2019-10-29 NOTE — Progress Notes (Signed)
[place]  Telephone:(336) [phone] Fax:(336) [phone]     ID: Meghan Padilla DOB: November 19, 1950  MR#: [id number]  WCH#:[id number]  Patient Care Team: [name], MD as PCP - General (Family Medicine) Magrinat, [name], MD as Consulting Physician (Oncology) Erroll Luna, MD as Consulting Physician (General Surgery) Gery Pray, MD as Consulting Physician (Radiation Oncology) Arvella Nigh, MD (Obstetrics and Gynecology) Franchot Gallo, MD as Consulting Physician (Urology) Wilford Corner, MD as Consulting Physician (Gastroenterology) Delice Bison, [name], NP as Nurse Practitioner (Hematology and Oncology) OTHER MD:  CHIEF COMPLAINT: Estrogen receptor positive breast cancer  CURRENT TREATMENT: Anastrozole   BREAST CANCER HISTORY: From the original intake note:  [facility] routine [facility] showing a possible right breast distortion and or mass and possibly a right axillary lymph node. She was recalled for diagnostic right mammography with tomography and ultrasonography at the [facility] 02/24/2016. This found her right breast density to be category C. In the upper right breast there was a site of distortion. The mass of concern was consistent with an intramamm [name]. On exam there was no palpable abnormality. Ultrasonography found a hypoechoic irregular mass with spiculated margins at the 12:00 position of the right breast measuring 0.5 cm. Ultrasound of the right axilla showed multiple normal-appearing lymph nodes.  Biopsy of the right breast mass in question 02/29/2016 showed orifices [id number]) and invasive ductal carcinoma, grade 2, estrogen receptor 95% positive, with strong staining intensity, progesterone receptor 2% positive, with strong staining intensity, with an MIB-1 of 10%, and no HER-2 amplification, the signals ratio being 1.48 and the number per cell 2.30.  Her  subsequent history is as detailed below.   INTERVAL HISTORY: Meghan Padilla returns today for follow-up and treatment of her estrogen receptor positive breast cancer. She is having urgent follow up about a breast concern.    She continues on anastrozole.  Since her last visit here, she underwent digital diagnostic bilateral mammogram with tomography on 03/08/2019 showing: Breast Density Category C. There is no mammographic evidence of malignancy.    REVIEW OF SYSTEMS: Meghan Padilla notes that she has a new knot at her lumpectomy site.  It feels different, and she isn't sure if it is growing.  She is concerned about this.  She denies any fever, chills, chest pain, palpitations, cough, shortness of breath, bowel/bladder changes, other breast changes or concerns.  A detailed ros was otherwise non contributory.   PAST MEDICAL HISTORY: Past Medical History:  Diagnosis Date  . Breast cancer ([place])   . Cancer [facility]) 2017   right breast  . Diabetes mellitus   . GERD (gastroesophageal reflux disease)   . History of radiation therapy 05/04/16-06/01/16   right breast 42.72 Gy boost to 10 Gy  . Hyperlipidemia   . Hypertension   . Personal history of radiation therapy 05/05/2016   ended radiaiton 06-01-2016  . UTI (urinary tract infection)     PAST SURGICAL HISTORY: Past Surgical History:  Procedure Laterality Date  . BREAST BIOPSY    . BREAST LUMPECTOMY     right 03/2016  . DILATION AND CURETTAGE OF UTERUS    . HAND SURGERY    . HAND SURGERY    . HYSTEROSCOPY WITH D & C  11/28/2008  . RADIOACTIVE SEED GUIDED PARTIAL MASTECTOMY WITH AXILLARY SENTINEL LYMPH NODE BIOPSY Right 03/24/2016   Procedure: RADIOACTIVE SEED GUIDED PARTIAL MASTECTOMY WITH AXILLARY SENTINEL LYMPH NODE BIOPSY;  Surgeon: Erroll Luna, MD;  Location:  [facility];  Service: General;  Laterality: Right;  . TONSILLECTOMY      FAMILY HISTORY Family History  Problem Relation Age of Onset  . Cancer Neg Hx    The  patient's parents are still living, in their late 53s. The patient had no brothers, 2 sisters. There is no history of breast or ovarian or any other cancer in the family to the patient's knowledge   GYNECOLOGIC HISTORY:  No LMP recorded. Patient is postmenopausal.  menarche age 35, first live birth age 70. The patient went through menopause in her early 54s. She did not take hormone replacement. 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 Her husband [name] was a Dealer at [employer]. He is retired and disabled after multiple back surgeries. Daughter Afiya Ferrebee lives in [place] and is disabled secondary to cognitive and behavioral issues. Son Martinique Bunte lives in [place] where he does car painting and body work.  He recently married into the [place] family and the patient now has a step 75 year old granddaughter that she is helping during the virtual school months.  She is a Tourist information centre manager.    ADVANCED DIRECTIVES: Not in place    HEALTH MAINTENANCE: Social History   Tobacco Use  . Smoking status: Never Smoker  . Smokeless tobacco: Never Used  Substance Use Topics  . Alcohol use: No  . Drug use: No     Colonoscopy: Dr. Michail Sermon  PAP:  Bone density:   Allergies  Allergen Reactions  . Sulfa Antibiotics Nausea Only    Current Outpatient Medications  Medication Sig Dispense Refill  . anastrozole (ARIMIDEX) 1 MG tablet TAKE 1 TABLET BY MOUTH EVERY DAY 90 tablet 0  . atorvastatin (LIPITOR) 40 MG tablet Take 40 mg by mouth daily.    . cholecalciferol (VITAMIN D) 1000 units tablet Take 1 tablet (1,000 Units total) by mouth daily. 100 tablet 4  . hydrOXYzine (ATARAX/VISTARIL) 10 MG tablet TAKE 1 TABLET BY MOUTH EVERYDAY AT BEDTIME  6  . JARDIANCE 10 MG TABS tablet Take 10 mg by mouth daily.    . metFORMIN (GLUCOPHAGE) 500 MG tablet Take 4 tablets  (2,000 mg total) by mouth at bedtime.    Marland Kitchen omeprazole (PRILOSEC) 20 MG capsule Take 20 mg by mouth daily.    . Probiotic Product (PROBIOTIC DAILY) CAPS Take by mouth.    . quinapril (ACCUPRIL) 20 MG tablet Take 20 mg by mouth at bedtime.    . tolterodine (DETROL LA) 4 MG 24 hr capsule Take 4 mg by mouth daily.     No current facility-administered medications for this visit.    OBJECTIVE: Middle-aged white woman in no acute distress  Vitals:   10/29/19 1445  BP: 133/73  Pulse: 98  Resp: 18  Temp: 99.2 F (37.3 C)  SpO2: 97%   Wt Readings from Last 3 Encounters:  10/29/19 131 lb 4.8 oz (59.6 kg)  06/06/19 128 lb 4.8 oz (58.2 kg)  06/05/18 132 lb 3.2 oz (60 kg)   Body mass index is 24.81 kg/m.    ECOG FS:1 - Symptomatic but completely ambulatory GENERAL: Patient is a well appearing female in no acute distress HEENT:  Sclerae anicteric.  Oropharynx clear and moist. No ulcerations or evidence of oropharyngeal candidiasis. Neck is supple.  NODES:  No cervical, supraclavicular, or axillary lymphadenopathy palpated.  BREAST EXAM: right breast  with small 1cm nodule at 12 oclock about 7cmfn, with associated skin dimpling (dimpling chronic since surgery per Meghan Padilla), otherwise benign, left breast benign LUNGS:  Clear to auscultation bilaterally.  No wheezes or rhonchi. HEART:  Regular rate and rhythm. No murmur appreciated. ABDOMEN:  Soft, nontender.  Positive, normoactive bowel sounds. No organomegaly palpated. MSK:  No focal spinal tenderness to palpation. Full range of motion bilaterally in the upper extremities. EXTREMITIES:  No peripheral edema.   SKIN:  Clear with no obvious rashes or skin changes. No nail dyscrasia. NEURO:  Nonfocal. Well oriented.  Appropriate affect.    LAB RESULTS:  CMP     Component Value Date/Time   NA 142 06/06/2019 1112   NA 142 04/10/2017 1329   K 3.7 06/06/2019 1112   K 3.5 04/10/2017 1329   CL 106 06/06/2019 1112   CO2 24 06/06/2019 1112    CO2 31 (H) 04/10/2017 1329   GLUCOSE 96 06/06/2019 1112   GLUCOSE 174 (H) 04/10/2017 1329   BUN 11 06/06/2019 1112   BUN 7.3 04/10/2017 1329   CREATININE 0.72 06/06/2019 1112   CREATININE 0.8 04/10/2017 1329   CALCIUM 9.1 06/06/2019 1112   CALCIUM 9.4 04/10/2017 1329   PROT 6.8 06/06/2019 1112   PROT 6.4 04/10/2017 1329   ALBUMIN 4.0 06/06/2019 1112   ALBUMIN 3.6 04/10/2017 1329   AST 19 06/06/2019 1112   AST 22 04/10/2017 1329   ALT 17 06/06/2019 1112   ALT 20 04/10/2017 1329   ALKPHOS 83 06/06/2019 1112   ALKPHOS 78 04/10/2017 1329   BILITOT 0.3 06/06/2019 1112   BILITOT 0.25 04/10/2017 1329   GFRNONAA >60 06/06/2019 1112   GFRAA >60 06/06/2019 1112    INo results found for: SPEP, UPEP  Lab Results  Component Value Date   WBC 7.4 08/06/2019   NEUTROABS 4.6 08/06/2019   HGB 14.2 08/06/2019   HCT 44.4 08/06/2019   MCV 84.7 08/06/2019   PLT 256 08/06/2019      Chemistry      Component Value Date/Time   NA 142 06/06/2019 1112   NA 142 04/10/2017 1329   K 3.7 06/06/2019 1112   K 3.5 04/10/2017 1329   CL 106 06/06/2019 1112   CO2 24 06/06/2019 1112   CO2 31 (H) 04/10/2017 1329   BUN 11 06/06/2019 1112   BUN 7.3 04/10/2017 1329   CREATININE 0.72 06/06/2019 1112   CREATININE 0.8 04/10/2017 1329      Component Value Date/Time   CALCIUM 9.1 06/06/2019 1112   CALCIUM 9.4 04/10/2017 1329   ALKPHOS 83 06/06/2019 1112   ALKPHOS 78 04/10/2017 1329   AST 19 06/06/2019 1112   AST 22 04/10/2017 1329   ALT 17 06/06/2019 1112   ALT 20 04/10/2017 1329   BILITOT 0.3 06/06/2019 1112   BILITOT 0.25 04/10/2017 1329       No results found for: LABCA2  No components found for: LABCA125  No results for input(s): INR in the last 168 hours.  Urinalysis    Component Value Date/Time   COLORURINE YELLOW 01/28/2012 1952   APPEARANCEUR CLEAR 01/28/2012 1952   LABSPEC 1.006 01/28/2012 1952   PHURINE 7.0 01/28/2012 1952   GLUCOSEU NEGATIVE 01/28/2012 1952   HGBUR  NEGATIVE 01/28/2012 1952   BILIRUBINUR NEGATIVE 01/28/2012 1952   KETONESUR NEGATIVE 01/28/2012 1952   PROTEINUR NEGATIVE 01/28/2012 1952   UROBILINOGEN 0.2 01/28/2012 1952   NITRITE NEGATIVE 01/28/2012 1952   LEUKOCYTESUR TRACE (A) 01/28/2012 1952  STUDIES: No results found.   ELIGIBLE FOR AVAILABLE RESEARCH PROTOCOL: no  ASSESSMENT: 69 y.o. [place] woman status post right breast upper outer quadrant biopsy 02/29/2016 for a clinical T1a N0, stage IA invasive ductal carcinoma, grade 2, estrogen receptor positive, progesterone receptor positive at 2%, HER-2 nonamplified, with an MIB-1 of 10%  (1) right lumpectomy and sentinel lymph node sampling 03/24/2016 confirmed a pT1c pN0, stage IA invasive lobular carcinoma, grade 2, with negative margins; a total of 3 sentinel lymph nodes were removed.  (2) Oncotype DX score of 16 ("low risk" "), predicts a risk of outside the breast recurrence within 10 years of 10% if the patient's only systemic therapy is tamoxifen for 5 years. It also predicts no benefit from adjuvant chemotherapy.  (3) adjuvant radiation 05/04/16-06/01/16:  52.72 Gy to the right breast+boost  (4) started anastrozole 07/22/2016  (a) Vitamin D level 23.3 on 10/04/2016  (b) bone density 02/20/2018 at Dr. Ophelia Charter 2019 showed a T score of -1.6 this is stable).  PLAN: Meghan Padilla has a new breast nodule that she is concerned about.  We will get a mammogram and ultrasound to fully evaluate at the breast center within the week.  She will continue on Anastrozole and will see [place] back in 05/2020 as scheduled.  She was recommended to continue with the appropriate pandemic precautions. She knows to call for any questions that may arise between now and her next appointment.  We are happy to see her sooner if needed.   Total encounter time: 20 minutes  Wilber Bihari, NP 10/29/19 2:59 PM Medical Oncology and Hematology [facility] [place], [address]  [address] Tel. [phone]    Fax. [phone]  *Total Encounter Time as defined by the Centers for Medicare and Medicaid Services includes, in addition to the face-to-face time of a patient visit (documented in the note above) non-face-to-face time: obtaining and reviewing outside history, ordering and reviewing medications, tests or procedures, care coordination (communications with other health care professionals or caregivers) and documentation in the medical record.

## 2019-10-30 ENCOUNTER — Telehealth: Payer: Self-pay | Admitting: Adult Health

## 2019-10-30 NOTE — Telephone Encounter (Signed)
There was no 3/9 los. No changes were made.

## 2019-10-31 DIAGNOSIS — R350 Frequency of micturition: Secondary | ICD-10-CM | POA: Diagnosis not present

## 2019-11-05 ENCOUNTER — Other Ambulatory Visit: Payer: Self-pay

## 2019-11-05 ENCOUNTER — Other Ambulatory Visit: Payer: Self-pay | Admitting: Adult Health

## 2019-11-05 ENCOUNTER — Ambulatory Visit
Admission: RE | Admit: 2019-11-05 | Discharge: 2019-11-05 | Disposition: A | Discharge status: Home / Self Care | Payer: Medicare Other | Source: Ambulatory Visit | Attending: Adult Health | Admitting: Adult Health

## 2019-11-05 DIAGNOSIS — N6310 Unspecified lump in the right breast, unspecified quadrant: Secondary | ICD-10-CM | POA: Diagnosis not present

## 2019-11-05 DIAGNOSIS — Z170 Estrogen receptor positive status [ER+]: Secondary | ICD-10-CM

## 2019-11-05 DIAGNOSIS — C50411 Malignant neoplasm of upper-outer quadrant of right female breast: Secondary | ICD-10-CM

## 2019-11-05 DIAGNOSIS — Z853 Personal history of malignant neoplasm of breast: Secondary | ICD-10-CM | POA: Diagnosis not present

## 2019-11-05 DIAGNOSIS — R922 Inconclusive mammogram: Secondary | ICD-10-CM | POA: Diagnosis not present

## 2019-11-05 IMAGING — MG MM DIGITAL DIAGNOSTIC UNILAT*R* W/ TOMO W/ CAD
7 series · 8 of 19 positions shown · non-contrast
Comparison: Previous exam(s).

CLINICAL DATA: 68-year-old female with history of right breast
cancer status post lumpectomy and radiation [0D]. Patient presents
with a new palpable area of concern near the lumpectomy site as well
as increasing skin retraction.

EXAM:
DIGITAL DIAGNOSTIC RIGHT MAMMOGRAM WITH TOMO
ULTRASOUND RIGHT BREAST

[R CC]
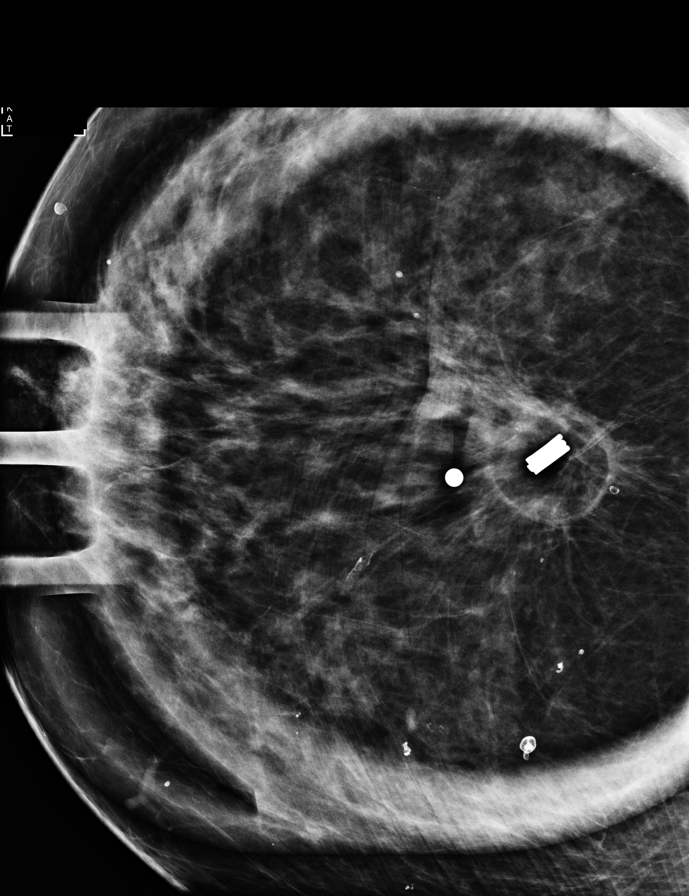

[R MLO synth-2D]
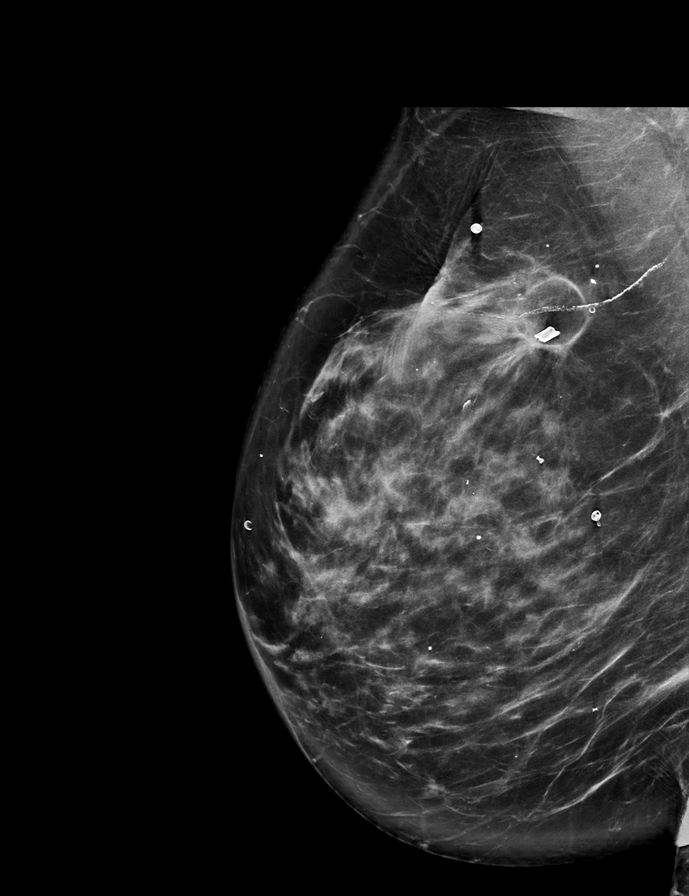

[R TAN synth-2D]
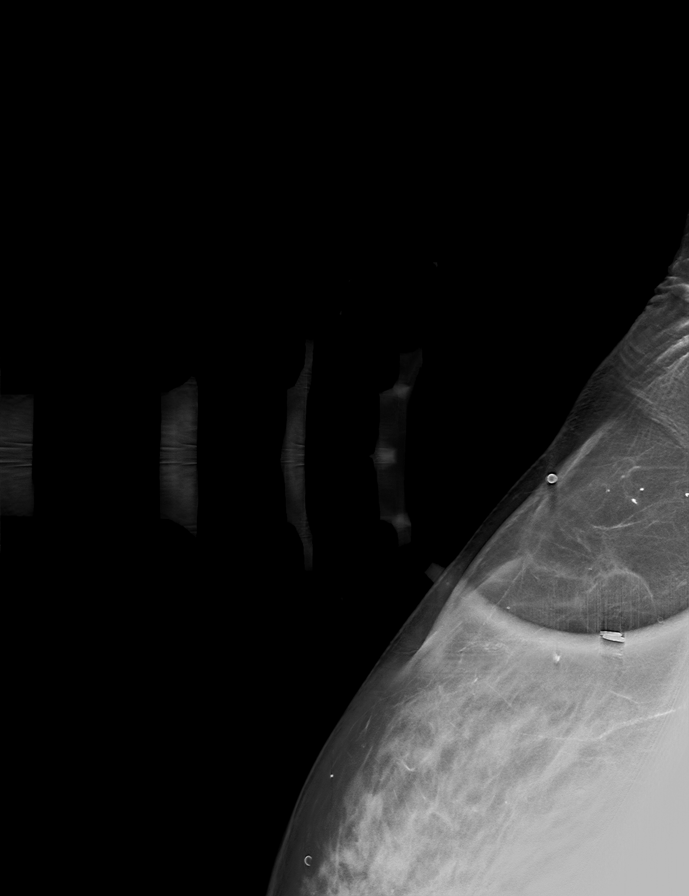

[R CC synth-2D]
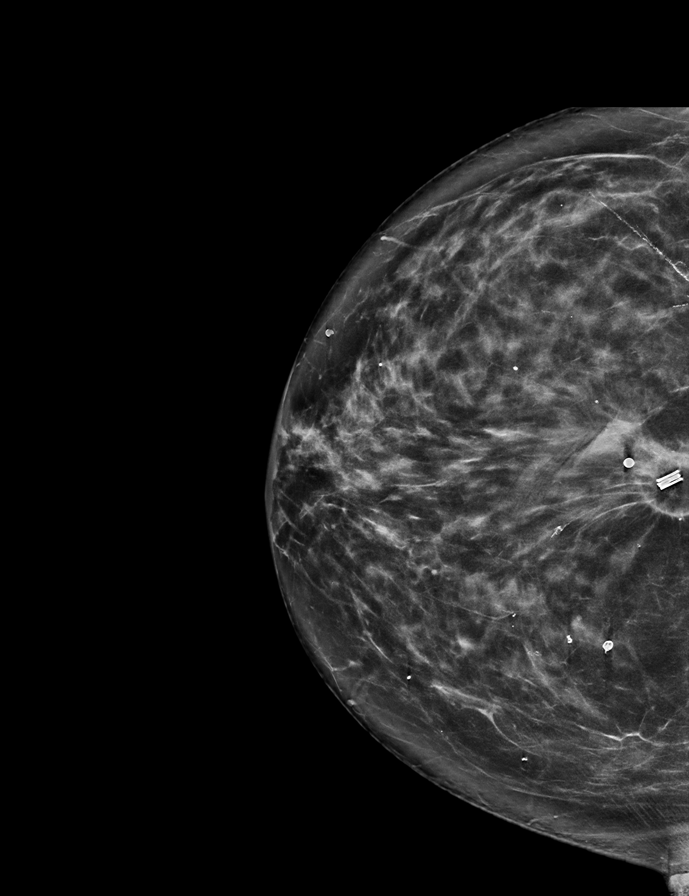

[R MLO tomo · 2 of 80 frames shown]
[frame 26/80]
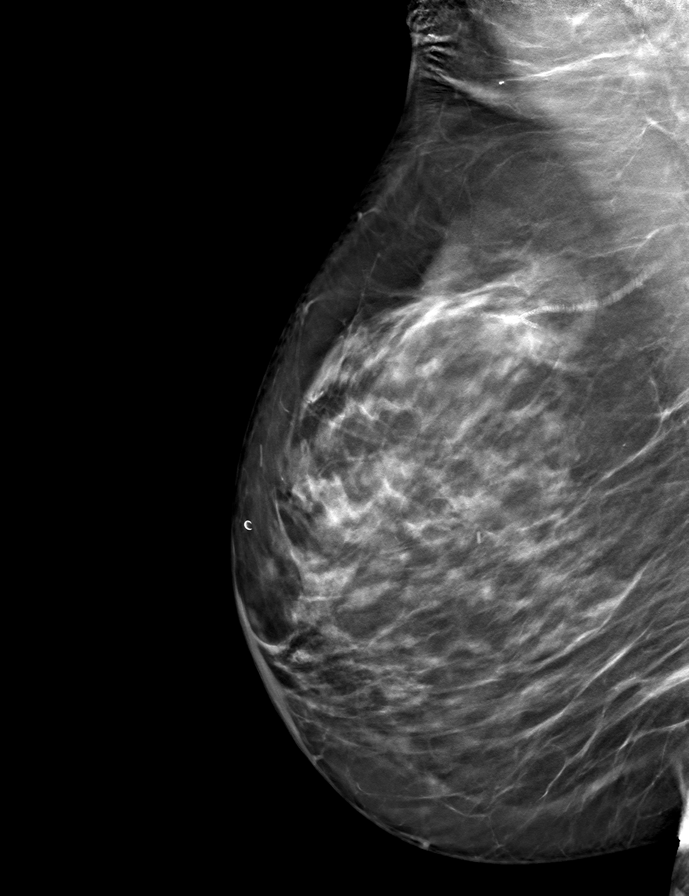
[frame 41/80]
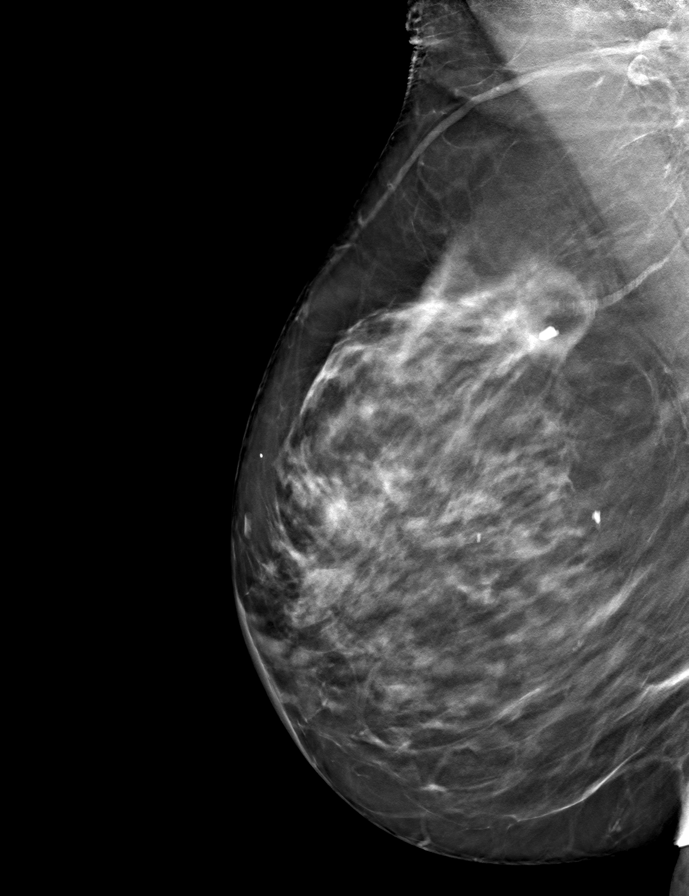

[R TAN tomo · tomo slice 37/72.0]
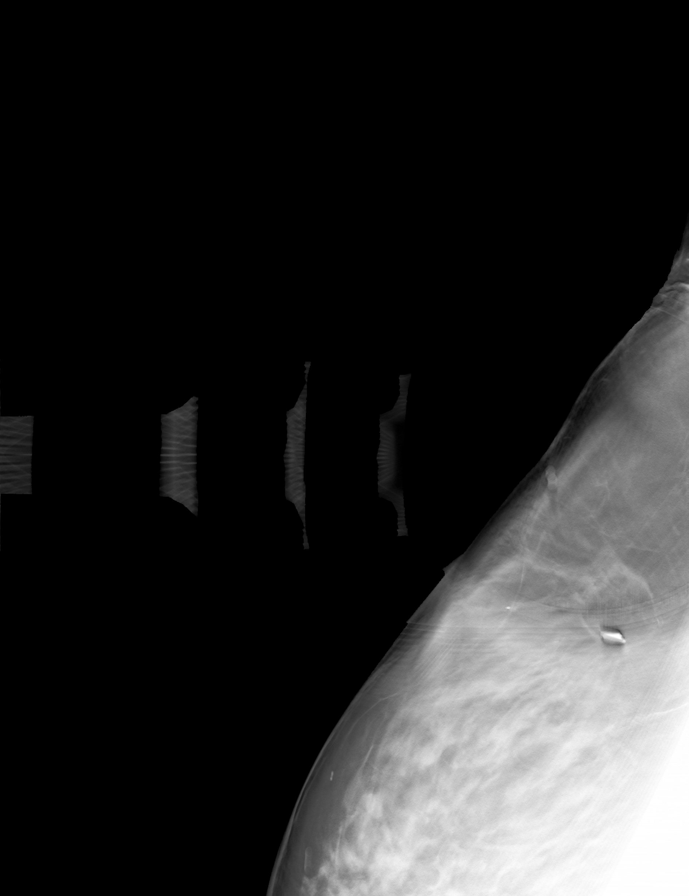

[R CC tomo · tomo slice 37/74.0]
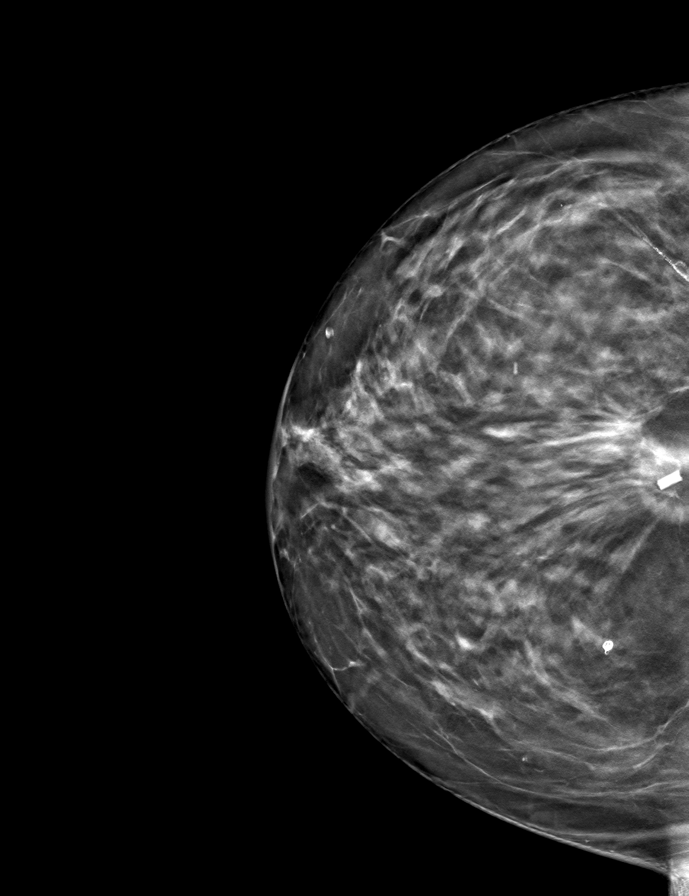

[8 of 19 positions shown; findings below may reference images not displayed]

ACR Breast Density Category c: The breast tissue is heterogeneously
dense, which may obscure small masses.
FINDINGS: Mammogram:

Right breast: Full field tomosynthesis as well as spot compression
views of the right breast were performed to evaluate the palpable
area of concern adjacent to the patient's lumpectomy site. There is
evidence of increasing skin retraction at the lumpectomy site. There
is a fat containing oil cyst and scarring at the lumpectomy site
which has not significantly changed compared to prior mammograms.
There is no definite new suspicious mass.

On physical exam, I palpate a discrete nodular area adjacent to the
lumpectomy scar associated with the skin retraction.

Ultrasound:

Targeted ultrasound is performed in the right breast at 12 o'clock 9
cm from the nipple at the lumpectomy site demonstrating an oval
circumscribed hyperechoic mass measuring 1.6 x 1.5 x 1.5 cm,
consistent with an oil cyst which corresponds to the mammographic
finding. Extending from the oil cyst towards the skin there is an
ill-defined area of mixed echogenicity with internal vascularity
measuring approximately 1.1 x 0.8 cm. Targeted ultrasound of the
right axilla demonstrates normal-appearing lymph nodes.
IMPRESSION: Increasing skin retraction and associated nodularity adjacent to the
lumpectomy site in the right superior breast is suspicious.

RECOMMENDATION:
1. Ultrasound-guided core needle biopsy of the ill-defined area
extending from the oil cyst in the right breast to the skin surface.

2. If the biopsy is negative, recommend breast MRI for evaluation of
the lumpectomy site and skin retraction.

I have discussed the findings and recommendations with the patient.
If applicable, a reminder letter will be sent to the patient
regarding the next appointment.

BI-RADS CATEGORY  4: Suspicious.

## 2019-11-05 IMAGING — US US BREAST*R* LIMITED INC AXILLA
1 series · 10 of 10 positions shown · non-contrast
Comparison: Previous exam(s).

CLINICAL DATA: 68-year-old female with history of right breast
cancer status post lumpectomy and radiation [0D]. Patient presents
with a new palpable area of concern near the lumpectomy site as well
as increasing skin retraction.

EXAM:
DIGITAL DIAGNOSTIC RIGHT MAMMOGRAM WITH TOMO
ULTRASOUND RIGHT BREAST

[Series 1: us breast*right* limited inc axilla · 0.06mm/px · 10 of 10 slices shown]
[im 1/10]
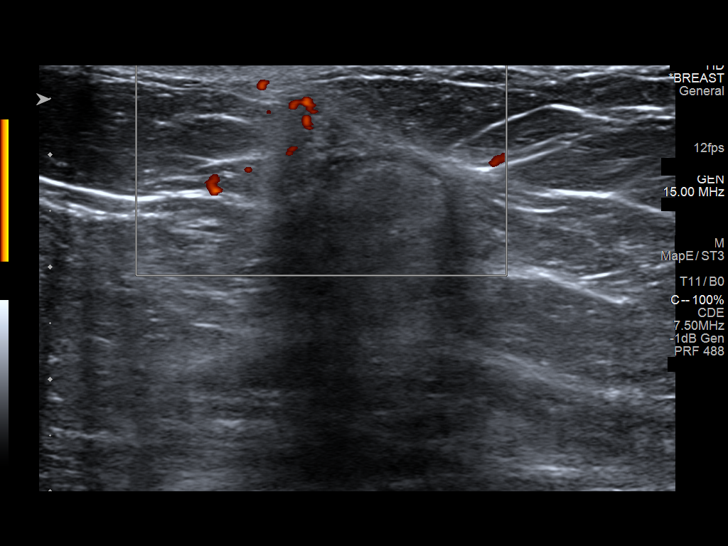
[im 2/10]
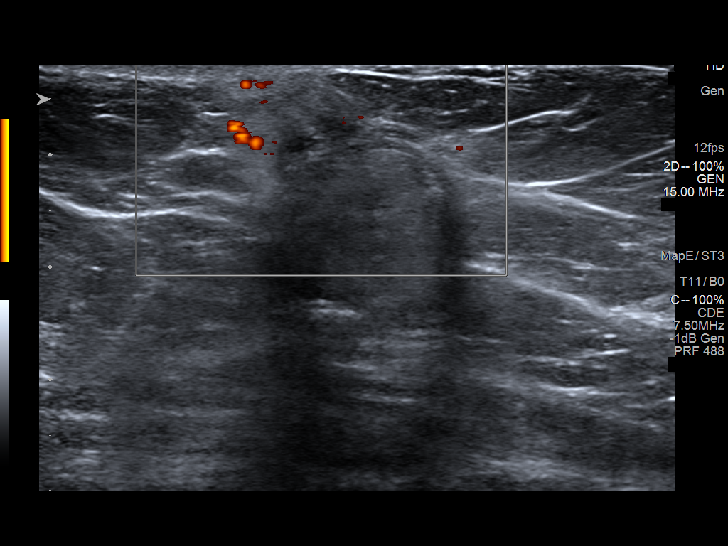
[im 3/10]
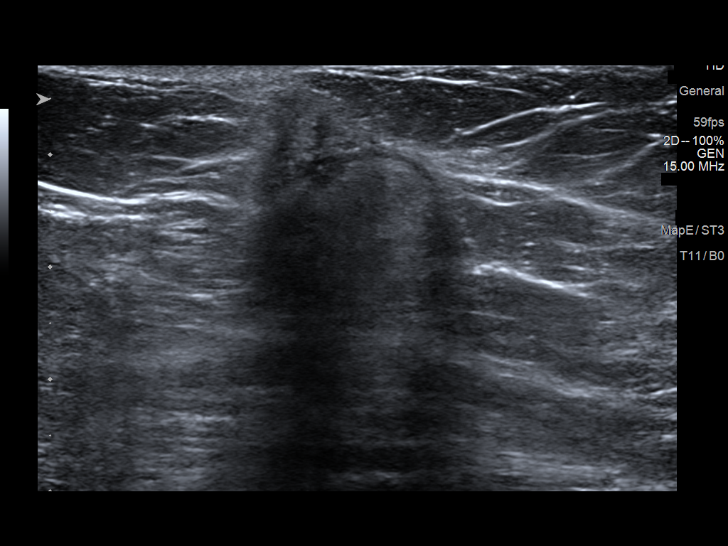
[im 4/10]
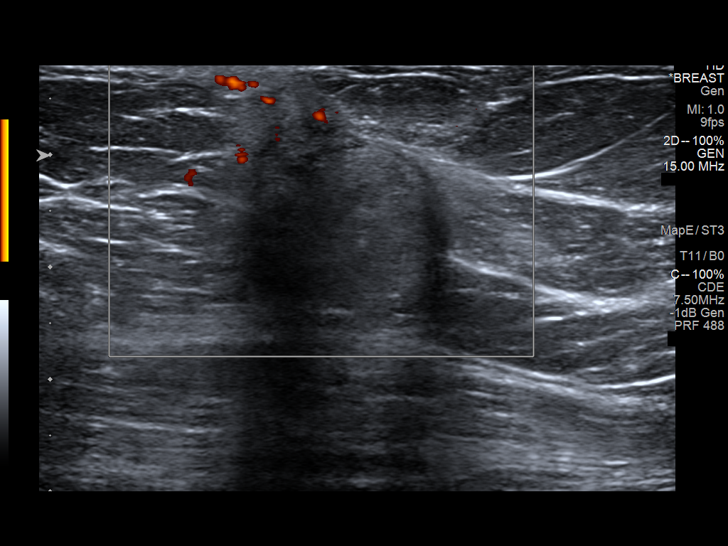
[im 5/10]
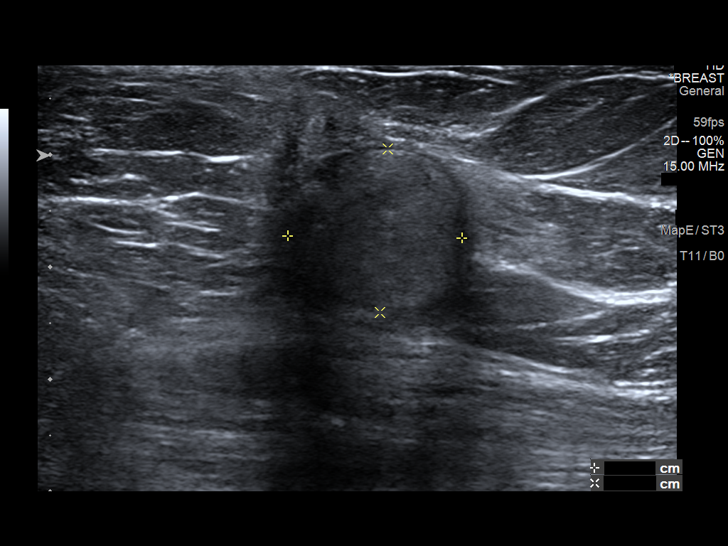
[im 6/10]
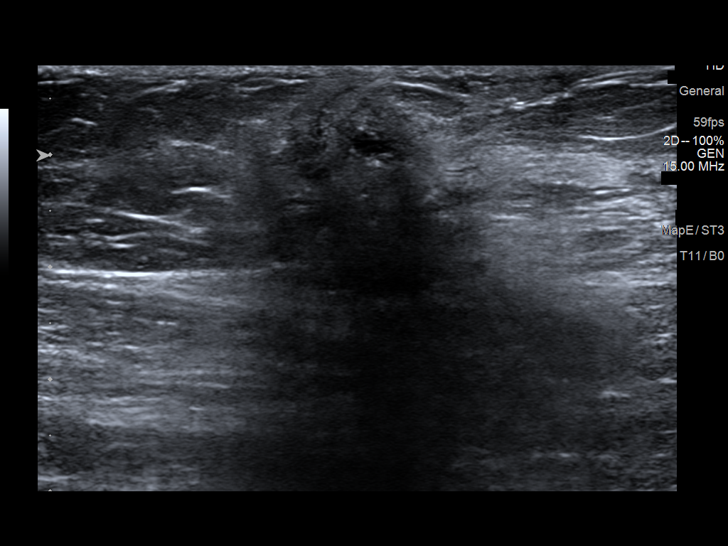
[im 7/10]
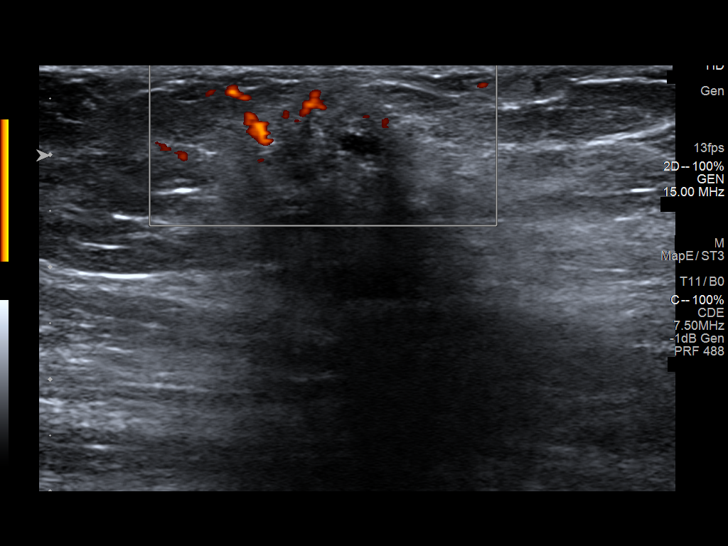
[im 8/10]
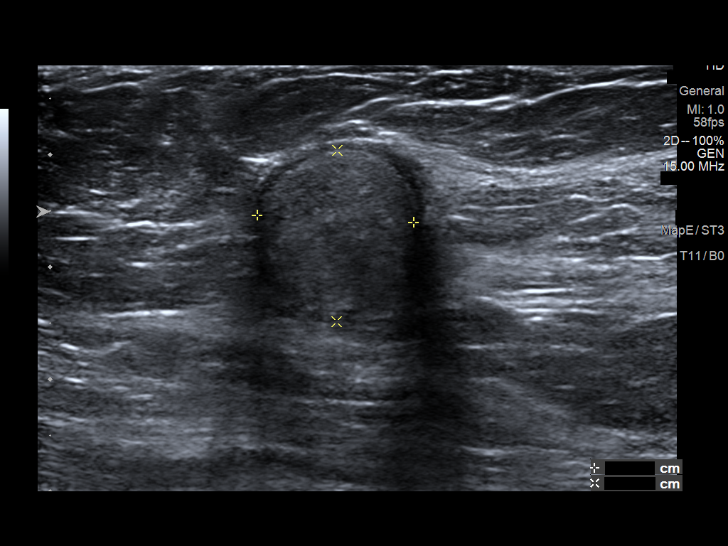
[im 9/10]
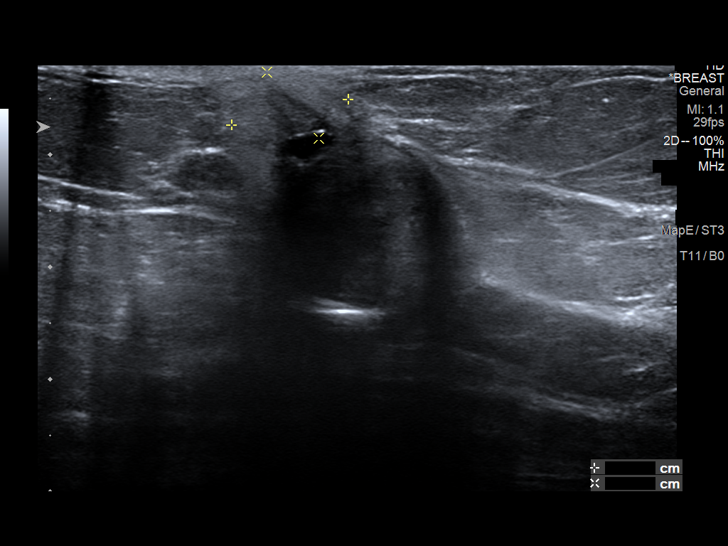
[im 10/10]
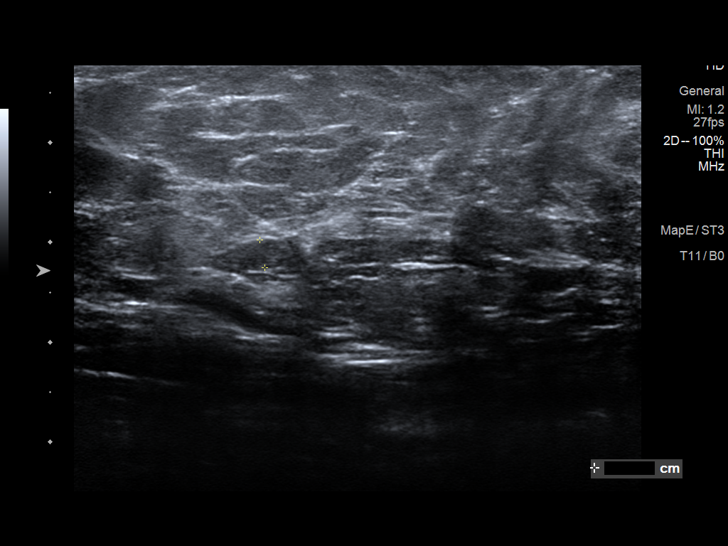

[10 of 10 positions shown; findings below may reference images not displayed]

ACR Breast Density Category c: The breast tissue is heterogeneously
dense, which may obscure small masses.
FINDINGS: Mammogram:

Right breast: Full field tomosynthesis as well as spot compression
views of the right breast were performed to evaluate the palpable
area of concern adjacent to the patient's lumpectomy site. There is
evidence of increasing skin retraction at the lumpectomy site. There
is a fat containing oil cyst and scarring at the lumpectomy site
which has not significantly changed compared to prior mammograms.
There is no definite new suspicious mass.

On physical exam, I palpate a discrete nodular area adjacent to the
lumpectomy scar associated with the skin retraction.

Ultrasound:

Targeted ultrasound is performed in the right breast at 12 o'clock 9
cm from the nipple at the lumpectomy site demonstrating an oval
circumscribed hyperechoic mass measuring 1.6 x 1.5 x 1.5 cm,
consistent with an oil cyst which corresponds to the mammographic
finding. Extending from the oil cyst towards the skin there is an
ill-defined area of mixed echogenicity with internal vascularity
measuring approximately 1.1 x 0.8 cm. Targeted ultrasound of the
right axilla demonstrates normal-appearing lymph nodes.
IMPRESSION: Increasing skin retraction and associated nodularity adjacent to the
lumpectomy site in the right superior breast is suspicious.

RECOMMENDATION:
1. Ultrasound-guided core needle biopsy of the ill-defined area
extending from the oil cyst in the right breast to the skin surface.

2. If the biopsy is negative, recommend breast MRI for evaluation of
the lumpectomy site and skin retraction.

I have discussed the findings and recommendations with the patient.
If applicable, a reminder letter will be sent to the patient
regarding the next appointment.

BI-RADS CATEGORY  4: Suspicious.

## 2019-11-06 ENCOUNTER — Ambulatory Visit: Payer: Medicare Other | Attending: Internal Medicine

## 2019-11-06 DIAGNOSIS — Z23 Encounter for immunization: Secondary | ICD-10-CM

## 2019-11-06 NOTE — Progress Notes (Signed)
   Covid-19 Vaccination Clinic  Name:  Meghan Padilla    MRN: [id number] DOB: 1951/07/10  11/06/2019  Meghan Padilla was observed post Covid-19 immunization for 15 minutes without incident. She was provided with Vaccine Information Sheet and instruction to access the V-Safe system.   Meghan Padilla was instructed to call 911 with any severe reactions post vaccine: Marland Kitchen Difficulty breathing  . Swelling of face and throat  . A fast heartbeat  . A bad rash all over body  . Dizziness and weakness   Immunizations Administered    Name Date Dose VIS Date Route   Pfizer COVID-19 Vaccine 11/06/2019  2:34 PM 0.3 mL 08/02/2019 Intramuscular   Manufacturer: [place]   Lot: [id number]   [place]: [id number]

## 2019-11-12 ENCOUNTER — Ambulatory Visit
Admission: RE | Admit: 2019-11-12 | Discharge: 2019-11-12 | Disposition: A | Discharge status: Home / Self Care | Payer: Medicare Other | Source: Ambulatory Visit | Attending: Adult Health | Admitting: Adult Health

## 2019-11-12 ENCOUNTER — Other Ambulatory Visit: Payer: Self-pay

## 2019-11-12 DIAGNOSIS — N6315 Unspecified lump in the right breast, overlapping quadrants: Secondary | ICD-10-CM | POA: Diagnosis not present

## 2019-11-12 DIAGNOSIS — Z170 Estrogen receptor positive status [ER+]: Secondary | ICD-10-CM

## 2019-11-12 DIAGNOSIS — N6311 Unspecified lump in the right breast, upper outer quadrant: Secondary | ICD-10-CM | POA: Diagnosis not present

## 2019-11-12 DIAGNOSIS — N641 Fat necrosis of breast: Secondary | ICD-10-CM | POA: Diagnosis not present

## 2019-11-12 DIAGNOSIS — N6312 Unspecified lump in the right breast, upper inner quadrant: Secondary | ICD-10-CM | POA: Diagnosis not present

## 2019-11-12 DIAGNOSIS — C50411 Malignant neoplasm of upper-outer quadrant of right female breast: Secondary | ICD-10-CM

## 2019-11-12 HISTORY — PX: BREAST BIOPSY: SHX20

## 2019-11-12 IMAGING — US US  BREAST BX W/ LOC DEV 1ST LESION IMG BX SPEC US GUIDE*R*
1 series · 11 of 11 positions shown · non-contrast
Comparison: Previous exam(s).
COMPARISON: Previous exam(s).

Addendum:
CLINICAL DATA: Patient presents for ultrasound-guided core biopsy
of mass in the RIGHT breast. History of RIGHT lumpectomy with
radiation treatment [BG].

EXAM:
ULTRASOUND GUIDED RIGHT BREAST CORE NEEDLE BIOPSY

[Series 1: us breast bx w/ loc dev 1st lesion img bx spec us  · 0.06mm/px · 11 of 11 slices shown]
[im 1/11]
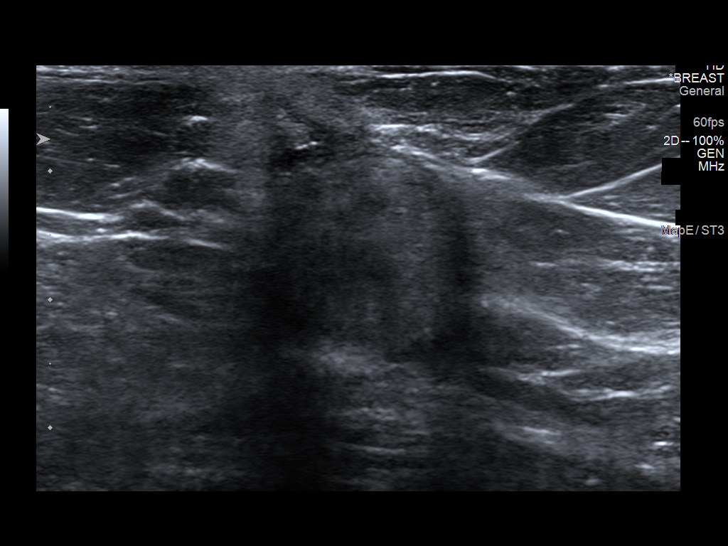
[im 2/11]
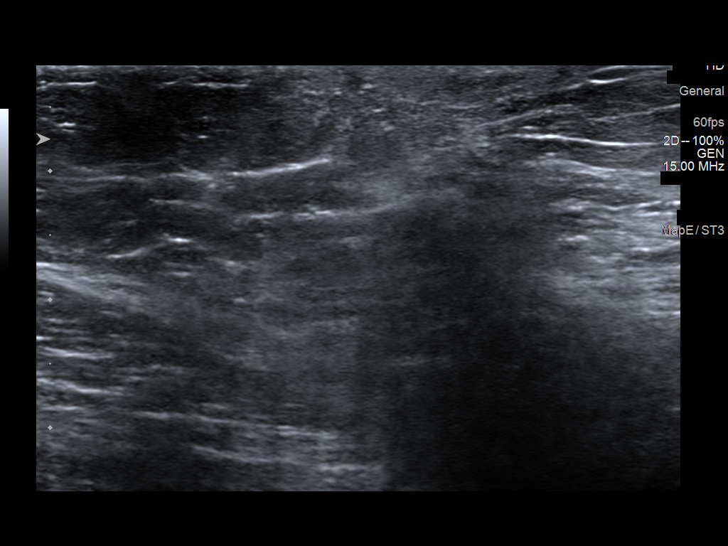
[im 3/11]
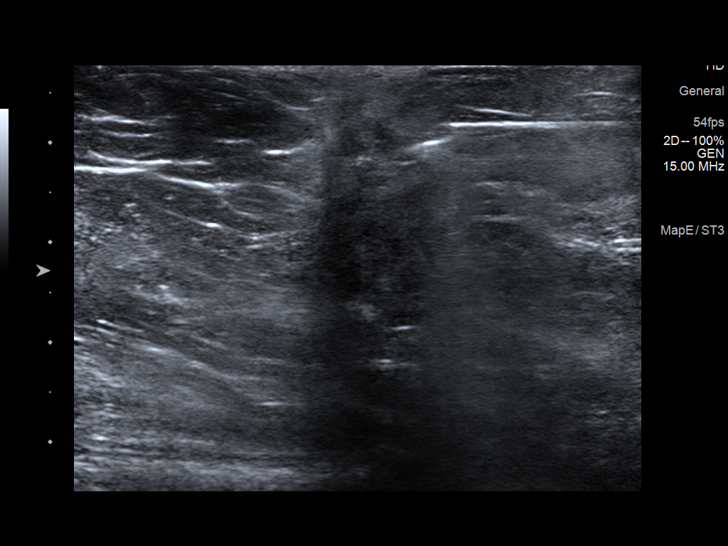
[im 4/11]
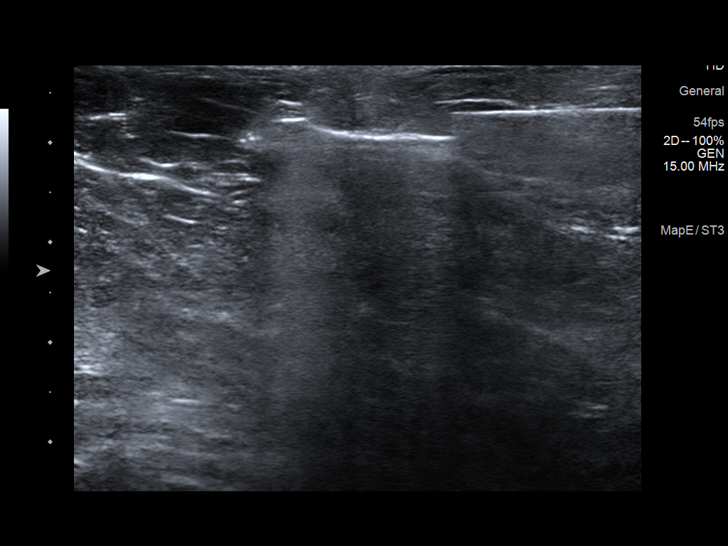
[im 5/11]
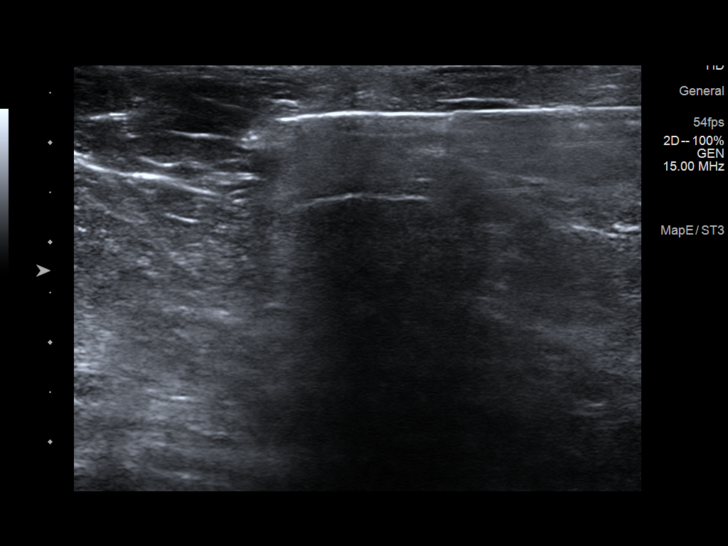
[im 6/11]
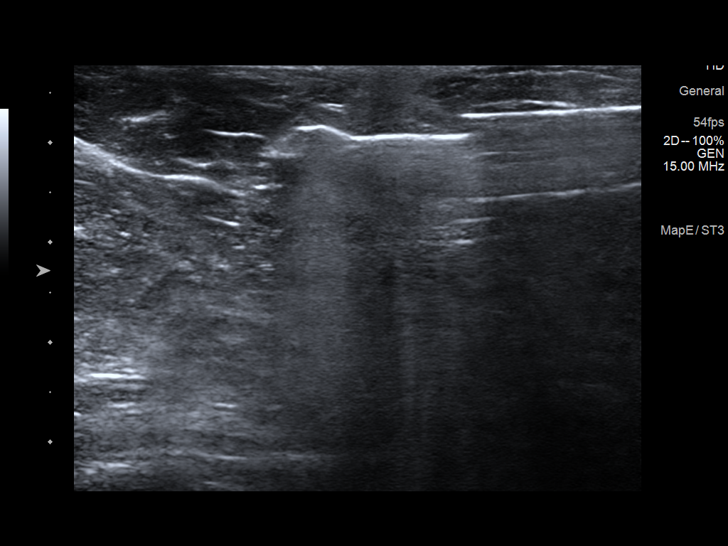
[im 7/11]
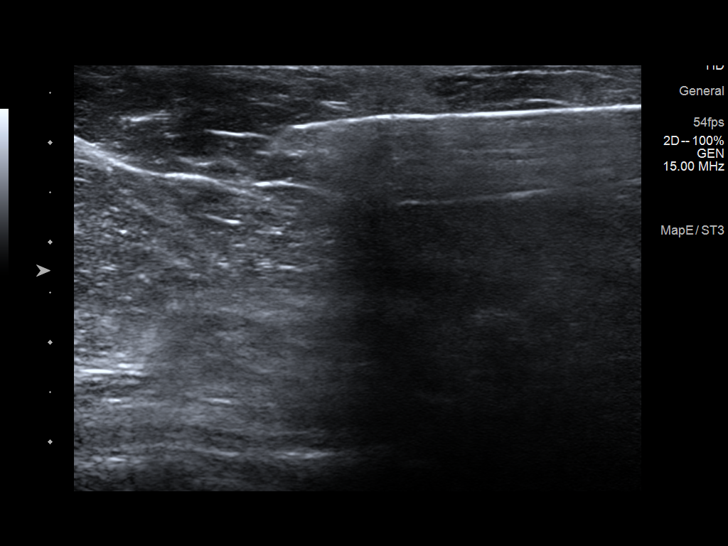
[im 8/11]
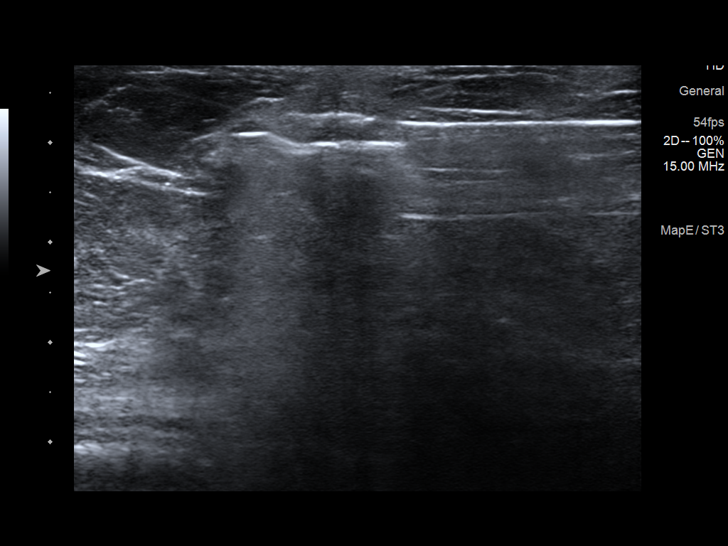
[im 9/11]
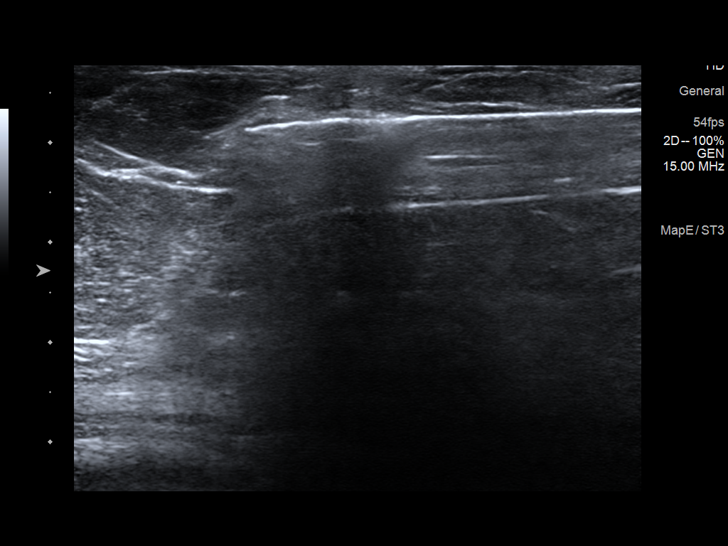
[im 10/11]
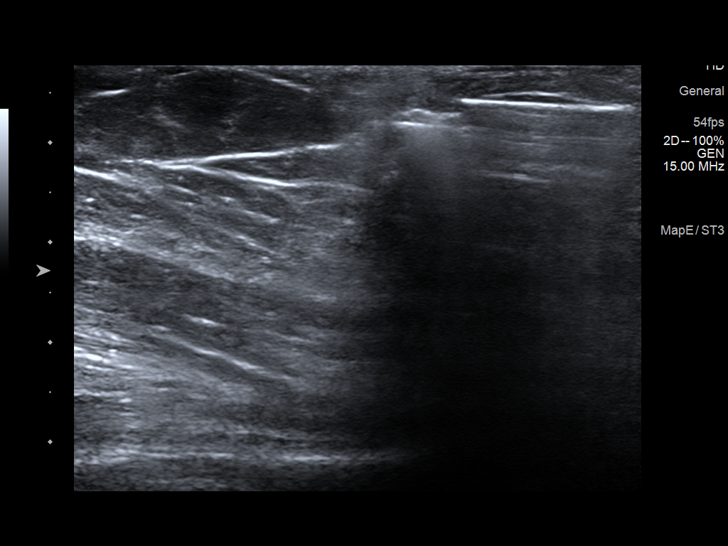
[im 11/11]
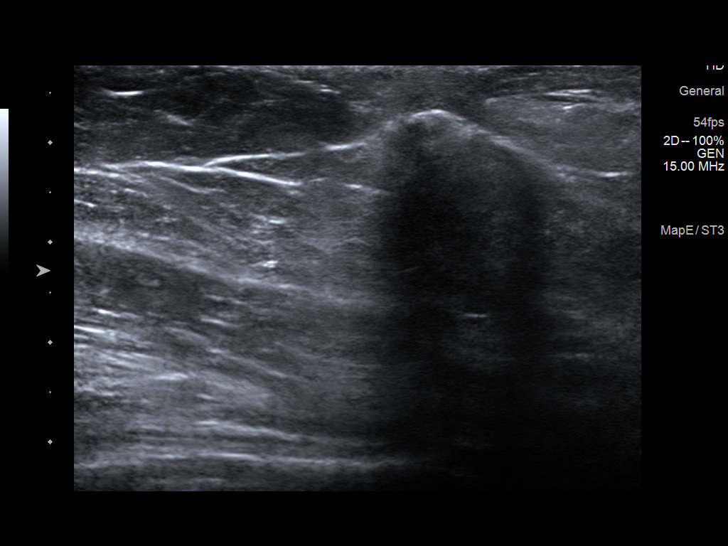

[11 of 11 positions shown; findings below may reference images not displayed]



Lesion quadrant: 12 o'clock location RIGHT breast

Using sterile technique and 1% Lidocaine as local anesthetic, under
direct ultrasound visualization, a 12 gauge RANAC device was
used to perform biopsy of mass in the 12 o'clock location of the
RIGHT breast 9 centimeters from the nipple using a MEDIAL to LATERAL
approach. At the conclusion of the procedure ribbon shaped tissue
marker clip was deployed into the biopsy cavity. Follow up 2 view
mammogram was performed and dictated separately.
IMPRESSION: Ultrasound guided biopsy of RIGHT breast mass. No apparent
complications.

Biopsy results are pending. Per recommendation of Dr. RANAC, if
biopsy is benign, recommend further evaluation with breast MRI.

ADDENDUM:
Pathology revealed FAT NECROSIS of the RIGHT breast, 12 o'clock,
ribbon clip. This was found to be concordant by Dr. RANAC.

Pathology results were discussed with the patient by telephone. The
patient reported doing well after the biopsy with tenderness at the
site. Post biopsy instructions and care were reviewed and questions
were answered. The patient was encouraged to call The [REDACTED]

Recommend breast MRI for evaluation of the lumpectomy site and skin
retraction.

Pathology results reported by RANAC RN on [DATE].



Lesion quadrant: 12 o'clock location RIGHT breast

Using sterile technique and 1% Lidocaine as local anesthetic, under
direct ultrasound visualization, a 12 gauge RANAC device was
used to perform biopsy of mass in the 12 o'clock location of the
RIGHT breast 9 centimeters from the nipple using a MEDIAL to LATERAL
approach. At the conclusion of the procedure ribbon shaped tissue
marker clip was deployed into the biopsy cavity. Follow up 2 view
mammogram was performed and dictated separately.
IMPRESSION: Ultrasound guided biopsy of RIGHT breast mass. No apparent
complications.

Biopsy results are pending. Per recommendation of Dr. RANAC, if
biopsy is benign, recommend further evaluation with breast MRI.

## 2019-11-12 IMAGING — MG MM BREAST LOCALIZATION CLIP
4 series · 4 of 12 positions shown · non-contrast
Comparison: Previous exam(s).

CLINICAL DATA: Status post ultrasound-guided core biopsy of mass in
the 12 o'clock location of the RIGHT.

EXAM:
DIAGNOSTIC RIGHT MAMMOGRAM POST ULTRASOUND BIOPSY

[R CC synth-2D]
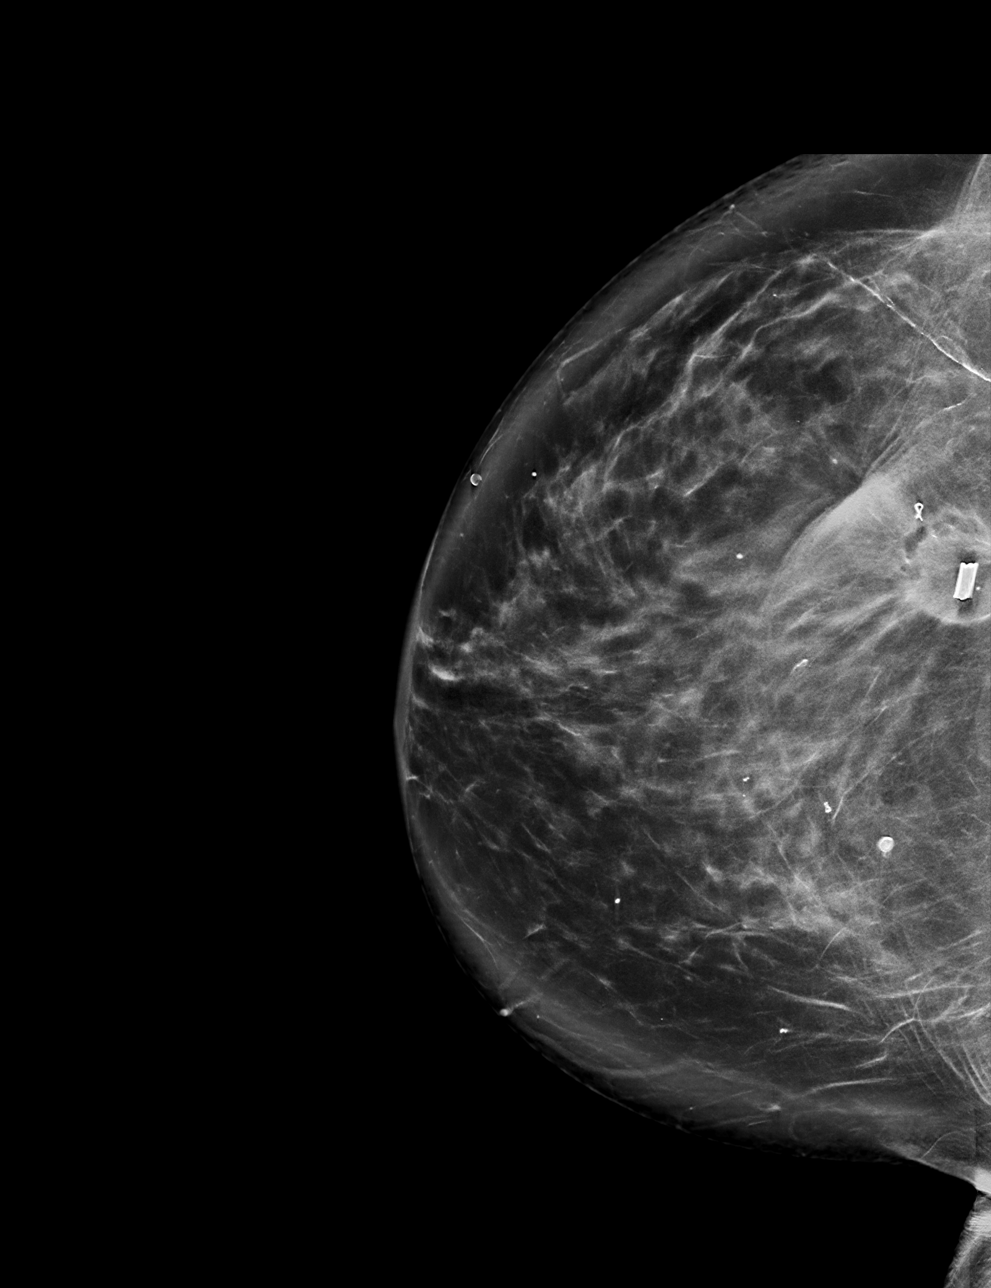

[R ML synth-2D]
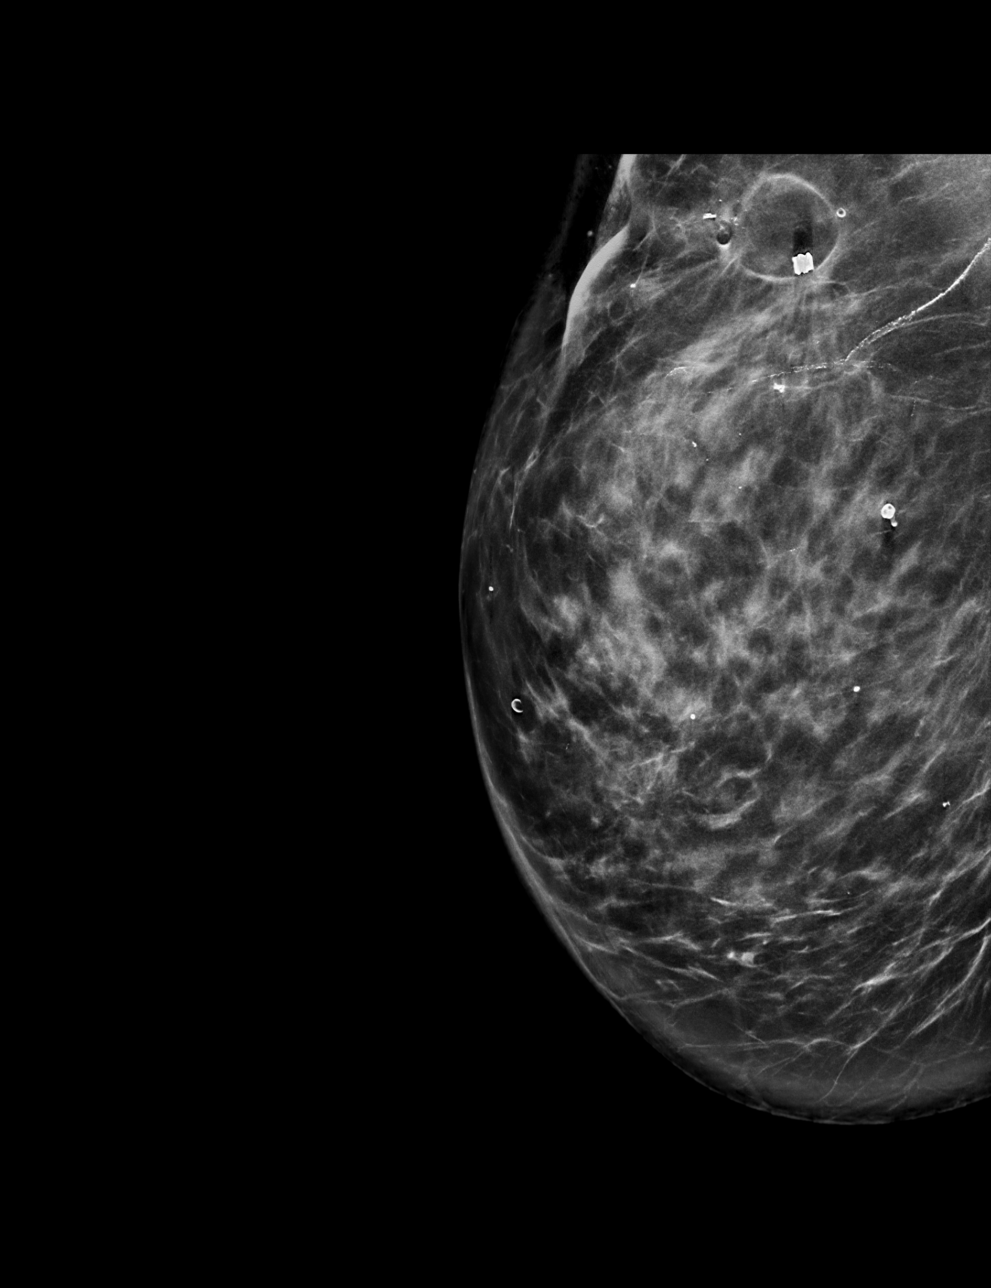

[R ML tomo · tomo slice 43/86.0]
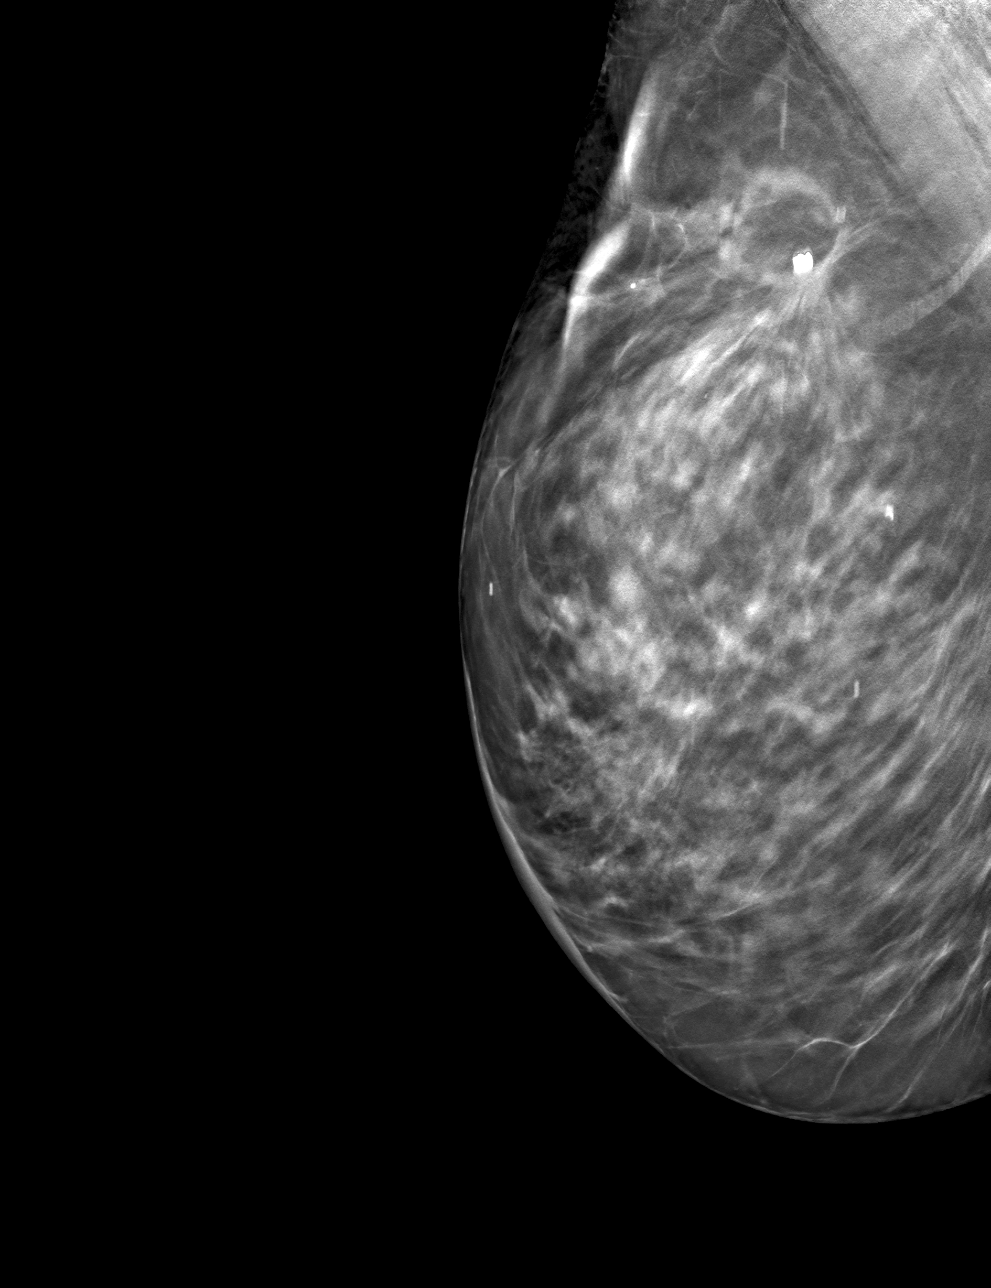

[R CC tomo · tomo slice 47/93.0]
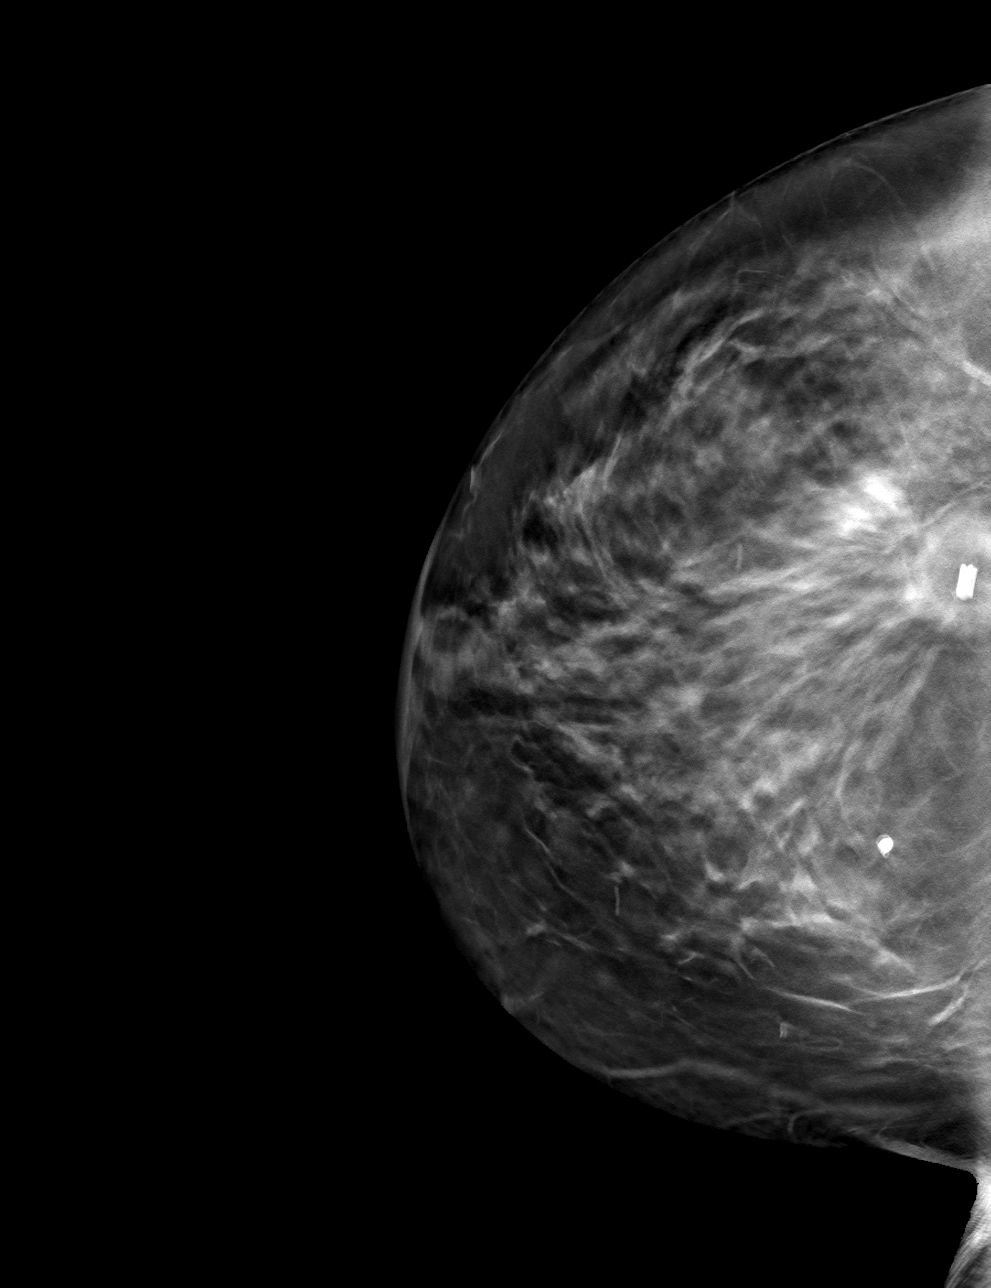

[4 of 12 positions shown; findings below may reference images not displayed]

FINDINGS: Mammographic images were obtained following ultrasound guided biopsy
of mass in the 12 o'clock location of the RIGHT breast and placement
of a ribbon shaped clip. The biopsy marking clip is in expected
position at the site of biopsy.
IMPRESSION: Appropriate positioning of the ribbon shaped biopsy marking clip at
the site of biopsy in the UPPER central portion of the RIGHT breast
adjacent to the lumpectomy site.

Final Assessment: Post Procedure Mammograms for Marker Placement

## 2019-11-14 ENCOUNTER — Other Ambulatory Visit: Payer: Self-pay | Admitting: Adult Health

## 2019-11-14 ENCOUNTER — Telehealth: Payer: Self-pay

## 2019-11-14 DIAGNOSIS — C50411 Malignant neoplasm of upper-outer quadrant of right female breast: Secondary | ICD-10-CM

## 2019-11-14 DIAGNOSIS — Z170 Estrogen receptor positive status [ER+]: Secondary | ICD-10-CM

## 2019-11-14 NOTE — Telephone Encounter (Signed)
TC to pt per Mendel Ryder NP to let her know that Mendel Ryder ordered a breast MRI for her. Patient verbalized understanding.

## 2019-11-14 NOTE — Progress Notes (Signed)
Patient was seen in clinic for a breast change and underwent a mammogram and ultrasound.  She had an area of fat necrosis that was biopsy proven.  The dimpling remains, and breast MRI for further and more thorough evaluation was recommended by our breast radiologist.  Orders placed today.  Wilber Bihari, NP

## 2019-11-19 DIAGNOSIS — I10 Essential (primary) hypertension: Secondary | ICD-10-CM | POA: Diagnosis not present

## 2019-11-19 DIAGNOSIS — N952 Postmenopausal atrophic vaginitis: Secondary | ICD-10-CM | POA: Diagnosis not present

## 2019-11-19 DIAGNOSIS — E7800 Pure hypercholesterolemia, unspecified: Secondary | ICD-10-CM | POA: Diagnosis not present

## 2019-11-19 DIAGNOSIS — E1169 Type 2 diabetes mellitus with other specified complication: Secondary | ICD-10-CM | POA: Diagnosis not present

## 2019-11-19 DIAGNOSIS — N760 Acute vaginitis: Secondary | ICD-10-CM | POA: Diagnosis not present

## 2019-11-20 DIAGNOSIS — H5213 Myopia, bilateral: Secondary | ICD-10-CM | POA: Diagnosis not present

## 2019-11-20 DIAGNOSIS — E119 Type 2 diabetes mellitus without complications: Secondary | ICD-10-CM | POA: Diagnosis not present

## 2019-11-28 DIAGNOSIS — R350 Frequency of micturition: Secondary | ICD-10-CM | POA: Diagnosis not present

## 2019-11-28 DIAGNOSIS — R3915 Urgency of urination: Secondary | ICD-10-CM | POA: Diagnosis not present

## 2019-12-01 ENCOUNTER — Ambulatory Visit
Admission: RE | Admit: 2019-12-01 | Discharge: 2019-12-01 | Disposition: A | Discharge status: Home / Self Care | Payer: Medicare Other | Source: Ambulatory Visit | Attending: Adult Health | Admitting: Adult Health

## 2019-12-01 ENCOUNTER — Other Ambulatory Visit: Payer: Self-pay

## 2019-12-01 DIAGNOSIS — Z170 Estrogen receptor positive status [ER+]: Secondary | ICD-10-CM

## 2019-12-01 DIAGNOSIS — Z853 Personal history of malignant neoplasm of breast: Secondary | ICD-10-CM | POA: Diagnosis not present

## 2019-12-01 DIAGNOSIS — C50411 Malignant neoplasm of upper-outer quadrant of right female breast: Secondary | ICD-10-CM

## 2019-12-01 IMAGING — MR MR BREAST BILAT WO/W CM
8 of 12 series · 32 of 48 positions shown · IV contrast (8ml of gadavist)
Comparison: Previous exam(s).

CLINICAL DATA: 68-year-old female with history of right breast
cancer status post lumpectomy and radiation in [46]. Patient
presented recently with a new palpable area of concern and
increasing skin retraction at the lumpectomy site. Subsequent workup
and biopsy demonstrated benign fat necrosis.

LABS:  None performed on site.
EXAM:
BILATERAL BREAST MRI WITH AND WITHOUT CONTRAST
TECHNIQUE: Multiplanar, multisequence MR images of both breasts were obtained
prior to and following the intravenous administration of 8 ml of
Gadavist.

[Series 2: t2_tirm_tra ipat (a-p) · axial · 3.0mm · 0.70mm/px · 1 of 55 slices shown]
[im 1/55]
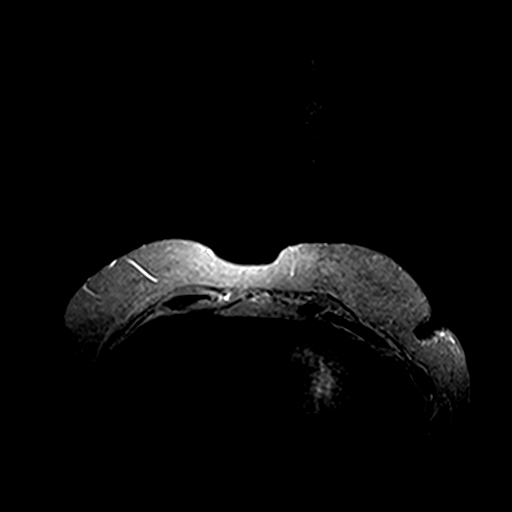

[Series 3: fl3d pre-cm no · axial · non-contrast · 1.2mm · 0.94mm/px · z∈[-23,+148]mm · 5 of 144 slices shown]
[im 1/144]
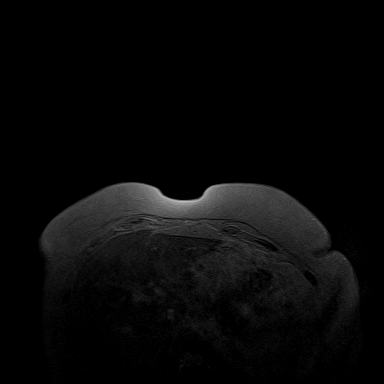
[im 36/144]
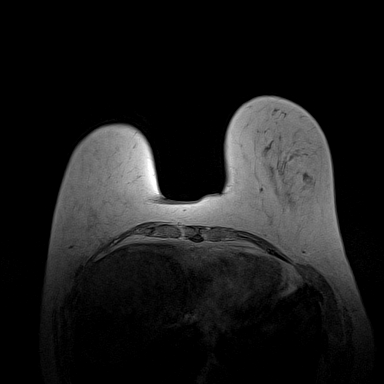
[im 72/144]
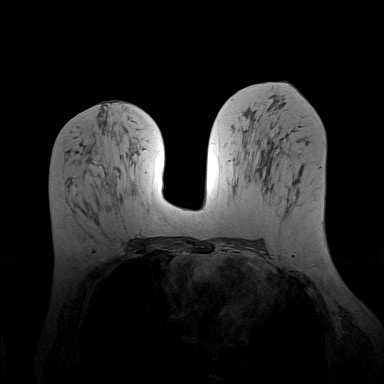
[im 108/144]
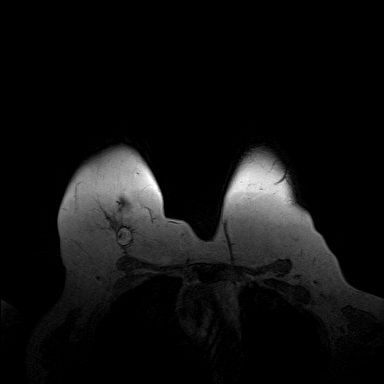
[im 144/144]
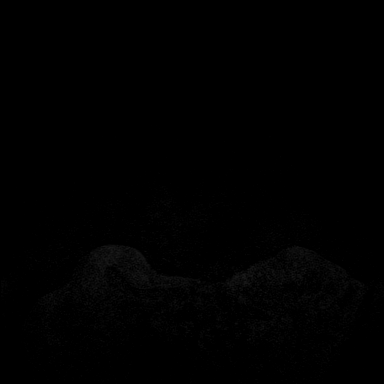

[Series 4: fl3d pre-cm · axial · non-contrast · 1.2mm · 0.94mm/px · z∈[-23,+148]mm · 5 of 144 slices shown]
[im 1/144]
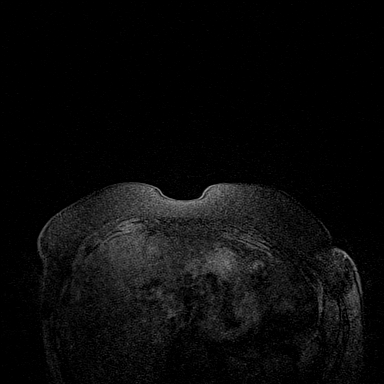
[im 36/144]
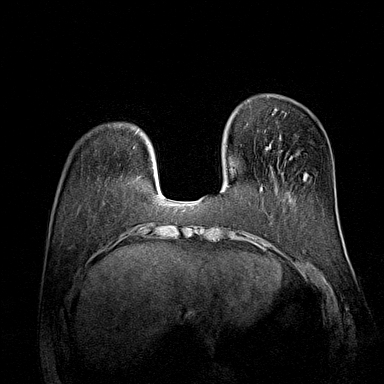
[im 72/144]
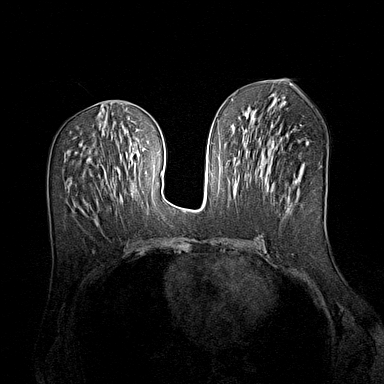
[im 108/144]
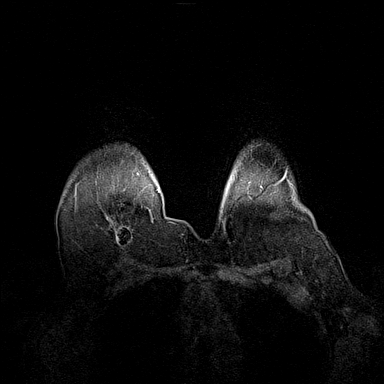
[im 144/144]
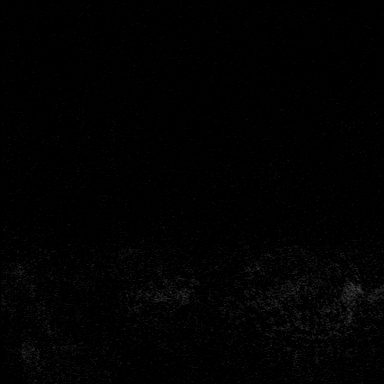

[Series 5: fl3d post-cm 20 · axial · 1.2mm · 0.94mm/px · z∈[-23,+148]mm · 5 of 144 slices shown (1 of 3)]
[im 1/144]
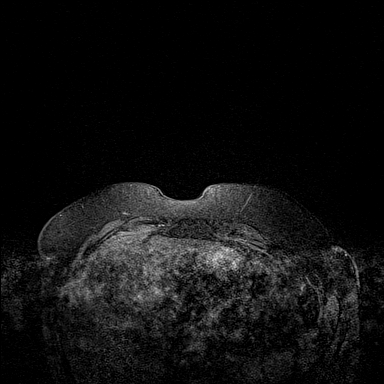
[im 36/144]
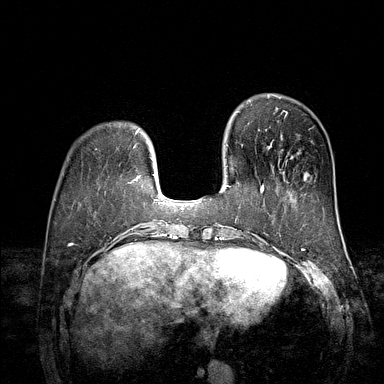
[im 72/144]
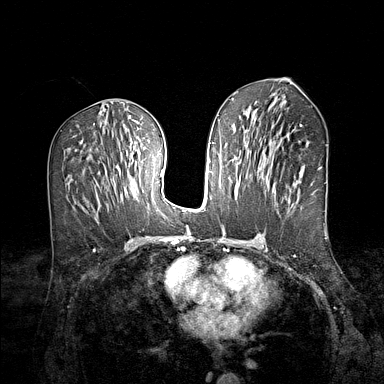
[im 108/144]
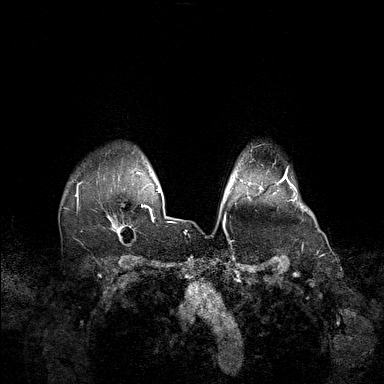
[im 144/144]
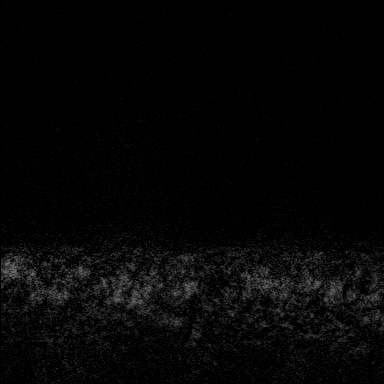

[Series 6: fl3d post-cm 20 · axial · 1.2mm · 0.94mm/px · z∈[-23,+148]mm · 5 of 144 slices shown (2 of 3)]
[im 1/144]
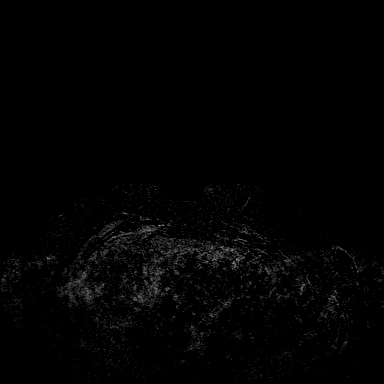
[im 36/144]
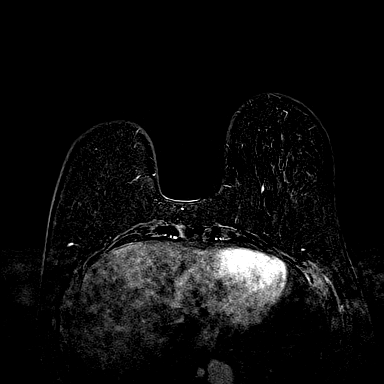
[im 72/144]
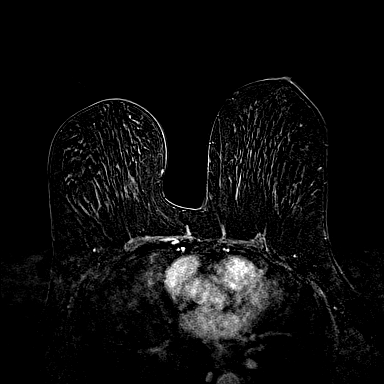
[im 108/144]
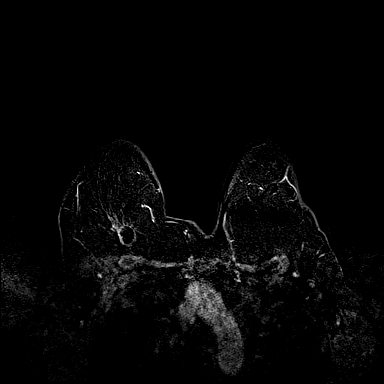
[im 144/144]
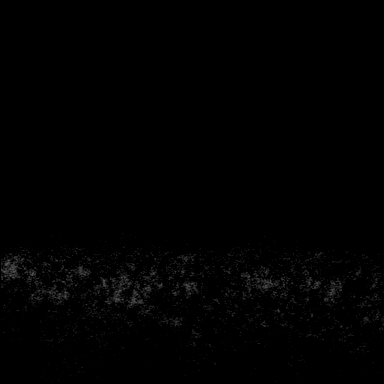

[Series 7: fl3d post-cm 20 · axial · 172.8mm · 0.94mm/px · 1 of 1 slices shown (3 of 3)]
[im 1/1]
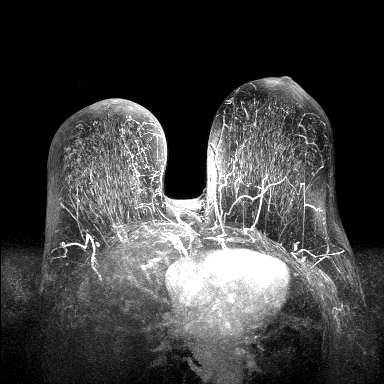

[Series 8: fl3d post-cm 3min · axial · 1.2mm · 0.94mm/px · z∈[-23,+148]mm · 6 of 144 slices shown]
[im 1/144]
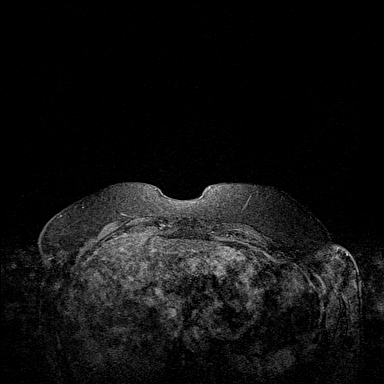
[im 29/144]
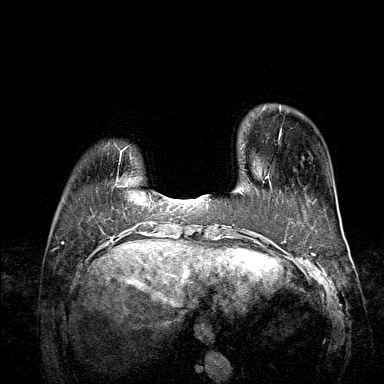
[im 58/144]
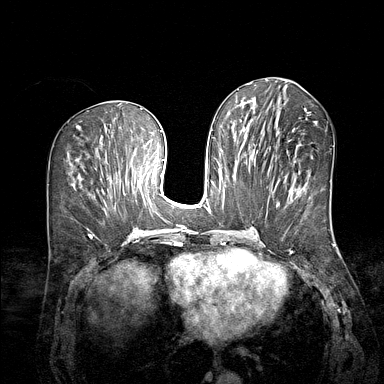
[im 86/144]
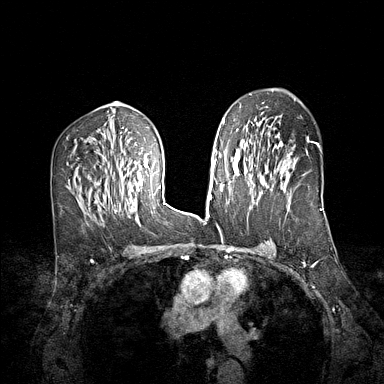
[im 115/144]
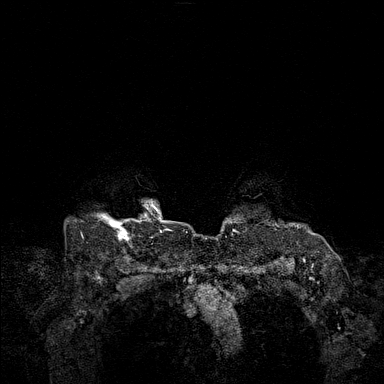
[im 144/144]
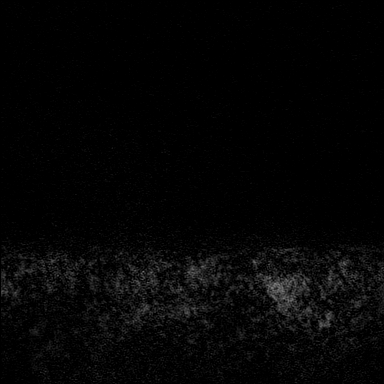

[Series 9: fl3d post-cm 3min_sub · axial · 1.2mm · 0.94mm/px · z∈[-23,+79]mm · 4 of 144 slices shown]
[im 1/144]
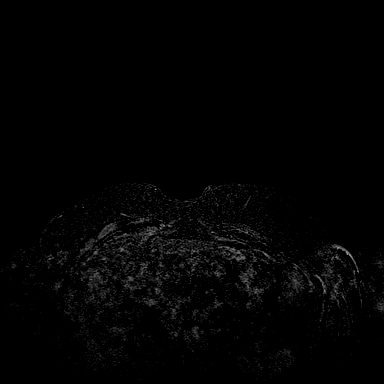
[im 29/144]
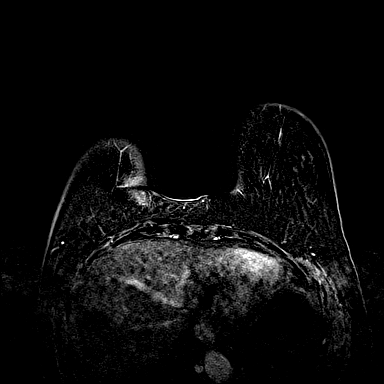
[im 58/144]
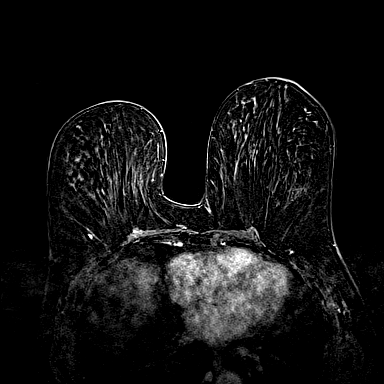
[im 86/144]
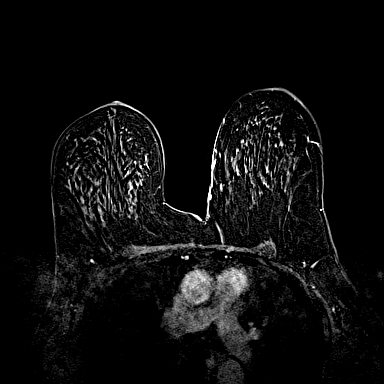

[32 of 48 positions shown; findings below may reference images not displayed]

Three-dimensional MR images were rendered by post-processing of the
original MR data on an independent workstation. The
three-dimensional MR images were interpreted, and findings are
reported in the following complete MRI report for this study. Three
dimensional images were evaluated at the independent DynaCad
workstation
FINDINGS: Breast composition: b. Scattered fibroglandular tissue.

Background parenchymal enhancement: Mild to moderate.

Right breast: Post-lumpectomy changes with associated susceptibility
artifact from recent biopsy is demonstrated in the far posterior
superior central right breast. There is mild associated enhancement
along the lumpectomy margin as well as the site of recent biopsy. No
nodular/focal or masslike enhancement is identified. Otherwise, no
suspicious mass or abnormal enhancement in the remainder of the
right breast.

Left breast: No suspicious mass or abnormal enhancement.

Lymph nodes: No abnormal appearing lymph nodes.

Ancillary findings:  None.
IMPRESSION: 1. Probably benign findings in association with right breast
lumpectomy site, likely representing a combination of fat necrosis
and recent post-biopsy changes. Recommendation is that the patient
return for follow-up MRI in 6 months to ensure
improvement/stability.
2. No suspicious MRI findings on the left.
3. No suspicious lymphadenopathy.

RECOMMENDATION:
Follow-up MRI in 6 months.

BI-RADS CATEGORY  3: Probably benign.

## 2019-12-01 MED ORDER — GADOBUTROL 1 MMOL/ML IV SOLN
8.0000 mL | Freq: Once | INTRAVENOUS | Status: AC | PRN
  Administered 2019-12-01: 8 mL via INTRAVENOUS

## 2019-12-02 ENCOUNTER — Other Ambulatory Visit: Payer: Self-pay | Admitting: Adult Health

## 2019-12-02 DIAGNOSIS — Z170 Estrogen receptor positive status [ER+]: Secondary | ICD-10-CM

## 2019-12-02 DIAGNOSIS — C50411 Malignant neoplasm of upper-outer quadrant of right female breast: Secondary | ICD-10-CM

## 2019-12-03 ENCOUNTER — Other Ambulatory Visit: Payer: Medicare Other

## 2019-12-13 DIAGNOSIS — R42 Dizziness and giddiness: Secondary | ICD-10-CM | POA: Diagnosis not present

## 2019-12-26 DIAGNOSIS — R3915 Urgency of urination: Secondary | ICD-10-CM | POA: Diagnosis not present

## 2019-12-26 DIAGNOSIS — R350 Frequency of micturition: Secondary | ICD-10-CM | POA: Diagnosis not present

## 2020-01-09 ENCOUNTER — Other Ambulatory Visit: Payer: Self-pay | Admitting: Oncology

## 2020-01-21 DIAGNOSIS — C50911 Malignant neoplasm of unspecified site of right female breast: Secondary | ICD-10-CM | POA: Diagnosis not present

## 2020-01-21 DIAGNOSIS — Z853 Personal history of malignant neoplasm of breast: Secondary | ICD-10-CM | POA: Diagnosis not present

## 2020-01-23 DIAGNOSIS — R350 Frequency of micturition: Secondary | ICD-10-CM | POA: Diagnosis not present

## 2020-01-28 ENCOUNTER — Other Ambulatory Visit: Payer: Self-pay | Admitting: Obstetrics and Gynecology

## 2020-01-28 ENCOUNTER — Other Ambulatory Visit: Payer: Self-pay | Admitting: Surgery

## 2020-01-28 ENCOUNTER — Other Ambulatory Visit: Payer: Self-pay | Admitting: Adult Health

## 2020-01-28 DIAGNOSIS — Z853 Personal history of malignant neoplasm of breast: Secondary | ICD-10-CM

## 2020-03-05 DIAGNOSIS — B373 Candidiasis of vulva and vagina: Secondary | ICD-10-CM | POA: Diagnosis not present

## 2020-03-05 DIAGNOSIS — E1169 Type 2 diabetes mellitus with other specified complication: Secondary | ICD-10-CM | POA: Diagnosis not present

## 2020-03-10 ENCOUNTER — Other Ambulatory Visit: Payer: Self-pay

## 2020-03-10 ENCOUNTER — Other Ambulatory Visit: Payer: Self-pay | Admitting: Surgery

## 2020-03-10 ENCOUNTER — Ambulatory Visit
Admission: RE | Admit: 2020-03-10 | Discharge: 2020-03-10 | Disposition: A | Discharge status: Home / Self Care | Payer: Medicare Other | Source: Ambulatory Visit | Attending: Surgery | Admitting: Surgery

## 2020-03-10 DIAGNOSIS — Z853 Personal history of malignant neoplasm of breast: Secondary | ICD-10-CM | POA: Diagnosis not present

## 2020-03-10 DIAGNOSIS — R922 Inconclusive mammogram: Secondary | ICD-10-CM | POA: Diagnosis not present

## 2020-03-10 DIAGNOSIS — R921 Mammographic calcification found on diagnostic imaging of breast: Secondary | ICD-10-CM

## 2020-03-10 IMAGING — MG DIGITAL DIAGNOSTIC BILAT W/ TOMO W/ CAD
8 of 13 series · 8 of 33 positions shown · non-contrast
Comparison: Previous exam(s).

CLINICAL DATA: 68-year-old female status post malignant right
lumpectomy with radiation in [6B].

EXAM:
DIGITAL DIAGNOSTIC BILATERAL MAMMOGRAM WITH TOMO AND CAD

[R ML (1 of 2)]
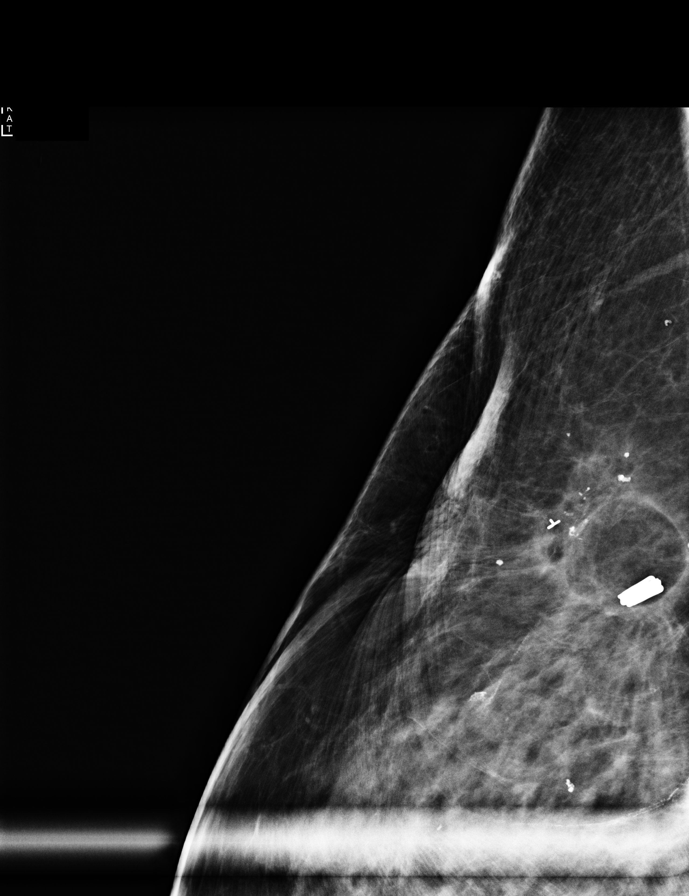

[R ML (2 of 2)]
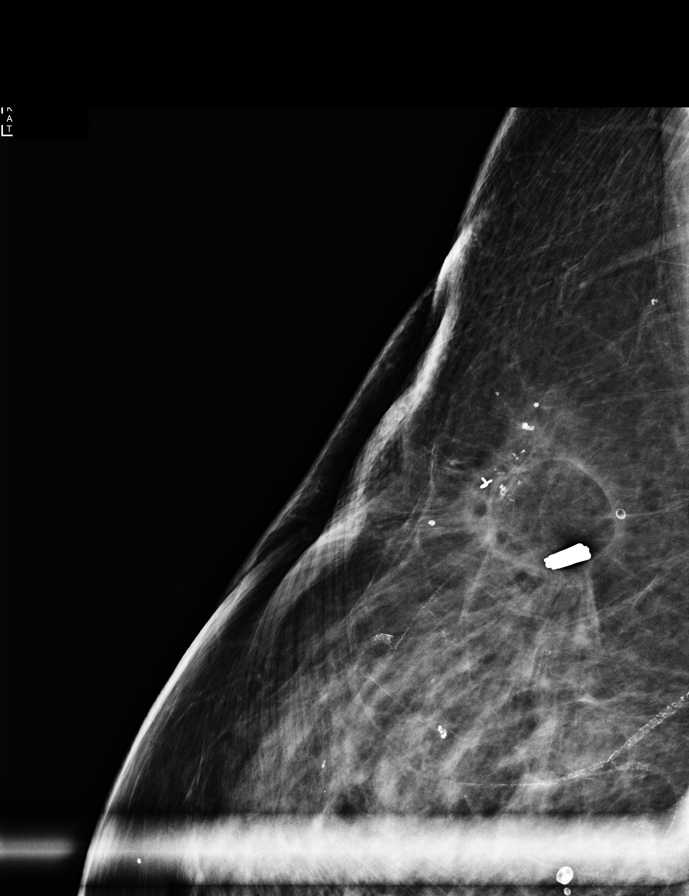

[R CC]
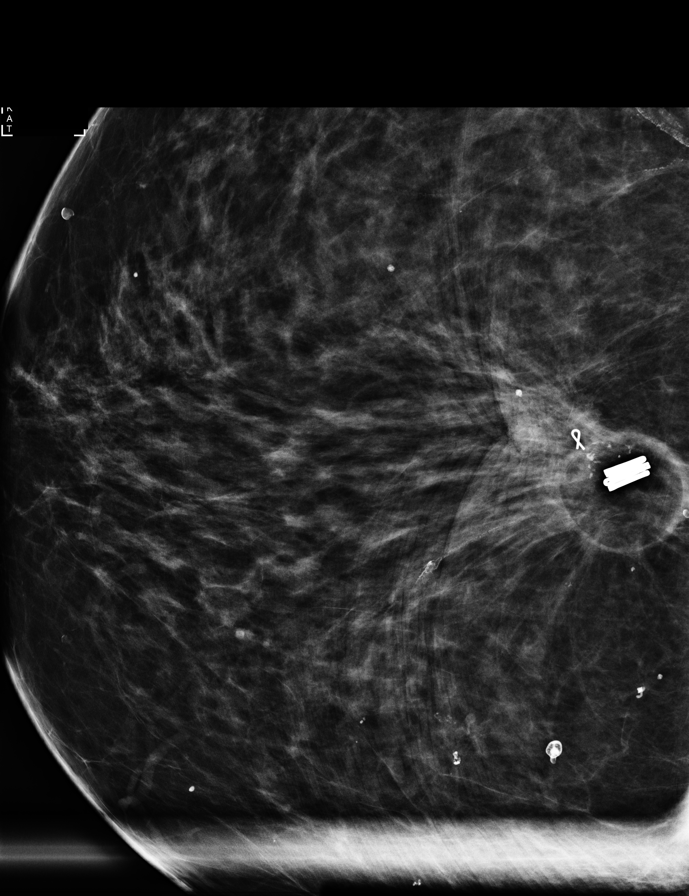

[R MLO synth-2D]
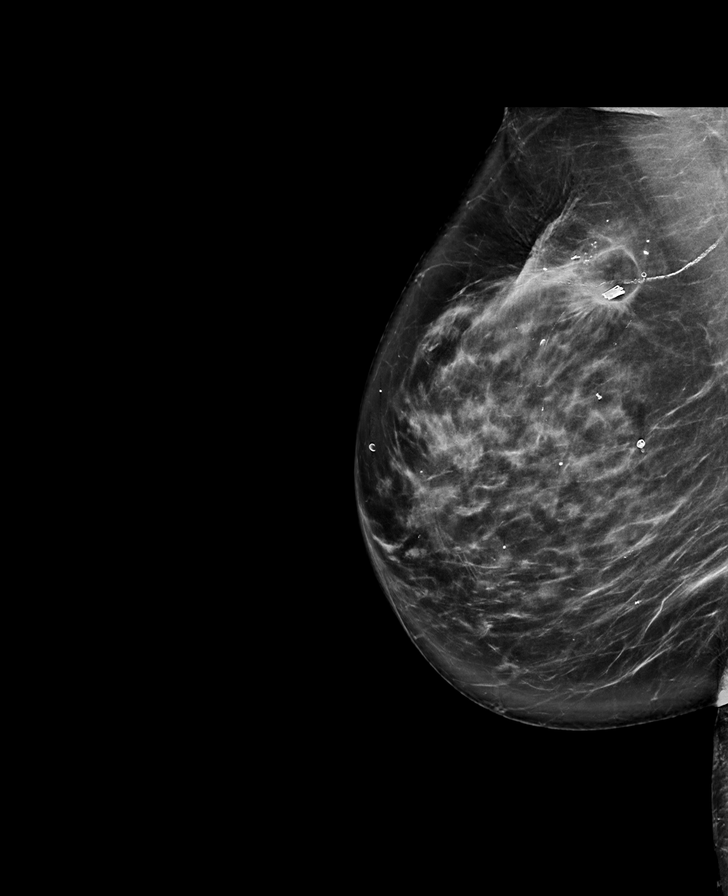

[R CC synth-2D (1 of 2)]
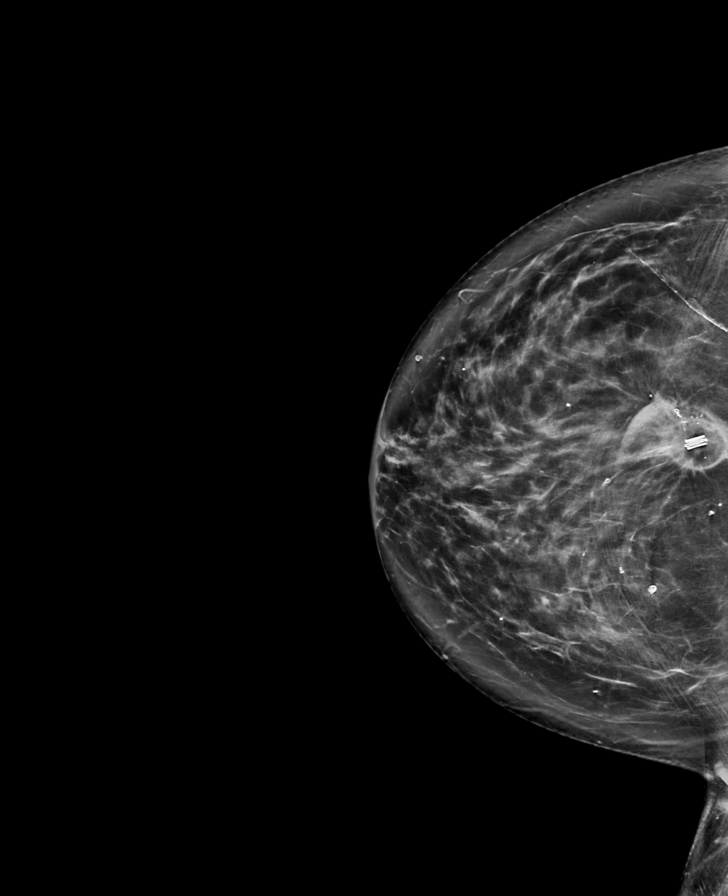

[R CC synth-2D (2 of 2)]
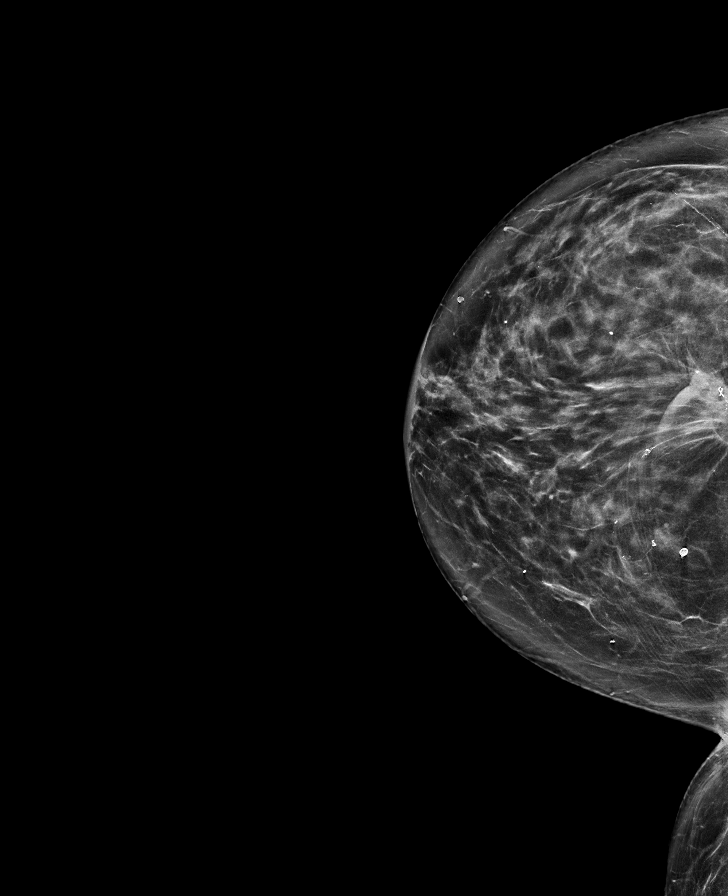

[L MLO synth-2D]
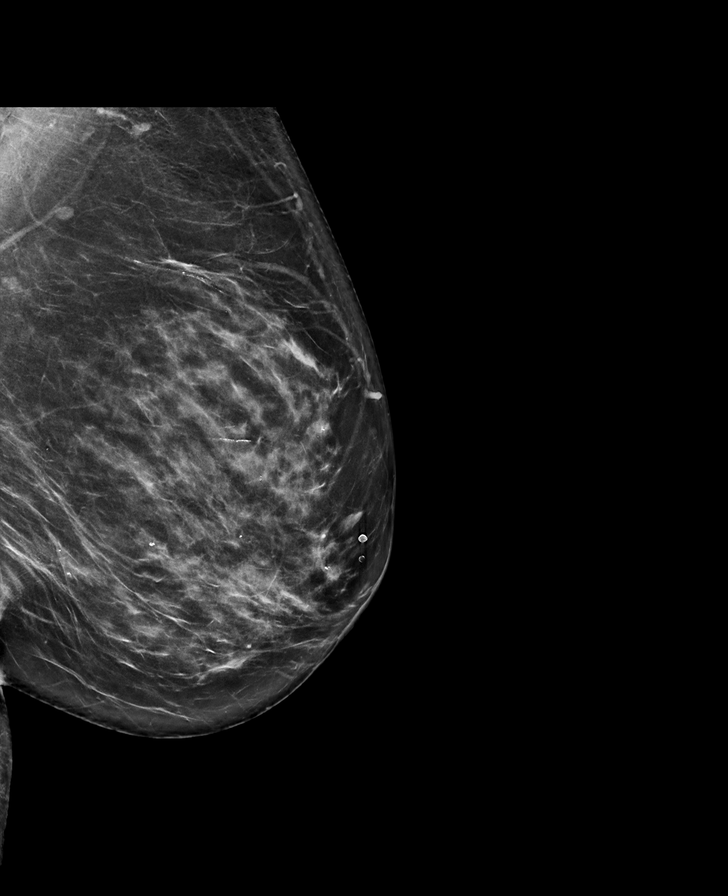

[L CC synth-2D]
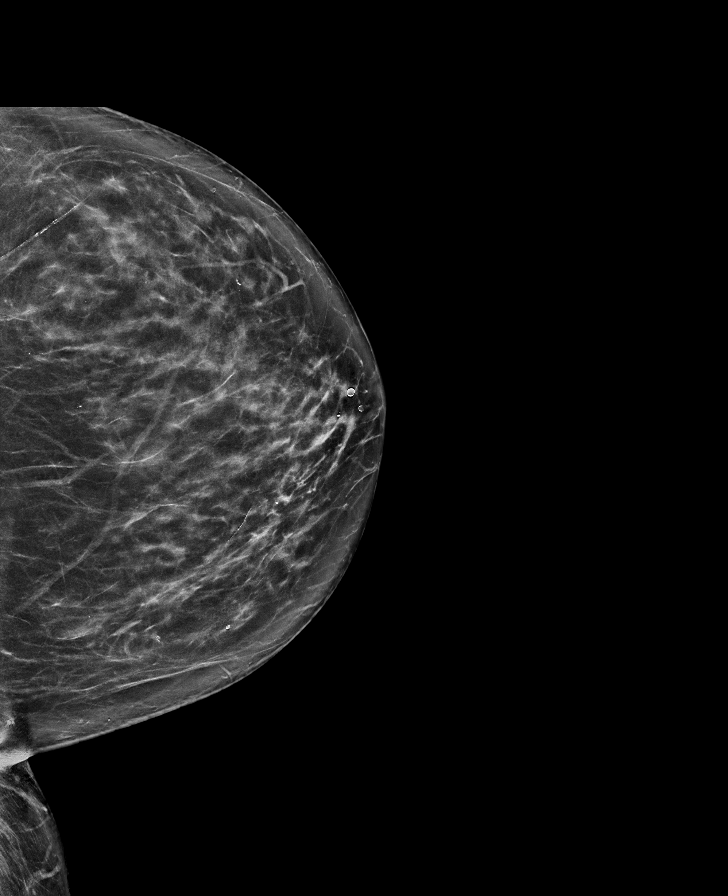

[8 of 33 positions shown; findings below may reference images not displayed]

ACR Breast Density Category c: The breast tissue is heterogeneously
dense, which may obscure small masses.
FINDINGS: Post lumpectomy changes again noted in the upper right breast. There
has been interval development of coarse calcifications, likely
representing early fat necrosis in association with post lumpectomy
and post biopsy changes. No new or suspicious findings identified in
the remainder of either breast.

Mammographic images were processed with CAD.
IMPRESSION: 1. Probably benign right lumpectomy bed calcifications likely
representing early changes of fat necrosis. Recommendation is for
continued mammographic follow-up.
2. No mammographic evidence of malignancy on the left.

RECOMMENDATION:
1. Diagnostic right breast mammogram in 6 months.
2. Patient will be due for follow-up contrast enhanced MRI in
[DATE].

I have discussed the findings and recommendations with the patient.
If applicable, a reminder letter will be sent to the patient
regarding the next appointment.

BI-RADS CATEGORY  3: Probably benign.

## 2020-03-19 DIAGNOSIS — R350 Frequency of micturition: Secondary | ICD-10-CM | POA: Diagnosis not present

## 2020-03-19 DIAGNOSIS — R3915 Urgency of urination: Secondary | ICD-10-CM | POA: Diagnosis not present

## 2020-04-16 DIAGNOSIS — R3915 Urgency of urination: Secondary | ICD-10-CM | POA: Diagnosis not present

## 2020-04-16 DIAGNOSIS — R350 Frequency of micturition: Secondary | ICD-10-CM | POA: Diagnosis not present

## 2020-05-03 DIAGNOSIS — R300 Dysuria: Secondary | ICD-10-CM | POA: Diagnosis not present

## 2020-05-03 DIAGNOSIS — R3915 Urgency of urination: Secondary | ICD-10-CM | POA: Diagnosis not present

## 2020-05-03 DIAGNOSIS — R350 Frequency of micturition: Secondary | ICD-10-CM | POA: Diagnosis not present

## 2020-05-14 DIAGNOSIS — R350 Frequency of micturition: Secondary | ICD-10-CM | POA: Diagnosis not present

## 2020-05-14 DIAGNOSIS — R3915 Urgency of urination: Secondary | ICD-10-CM | POA: Diagnosis not present

## 2020-05-14 DIAGNOSIS — R300 Dysuria: Secondary | ICD-10-CM | POA: Diagnosis not present

## 2020-05-31 ENCOUNTER — Other Ambulatory Visit: Payer: Self-pay | Admitting: Urology

## 2020-06-02 ENCOUNTER — Other Ambulatory Visit: Payer: Medicare Other

## 2020-06-02 DIAGNOSIS — I10 Essential (primary) hypertension: Secondary | ICD-10-CM | POA: Diagnosis not present

## 2020-06-02 DIAGNOSIS — E1169 Type 2 diabetes mellitus with other specified complication: Secondary | ICD-10-CM | POA: Diagnosis not present

## 2020-06-02 DIAGNOSIS — Z0000 Encounter for general adult medical examination without abnormal findings: Secondary | ICD-10-CM | POA: Diagnosis not present

## 2020-06-02 DIAGNOSIS — N3090 Cystitis, unspecified without hematuria: Secondary | ICD-10-CM | POA: Diagnosis not present

## 2020-06-02 DIAGNOSIS — Z1159 Encounter for screening for other viral diseases: Secondary | ICD-10-CM | POA: Diagnosis not present

## 2020-06-02 DIAGNOSIS — C50911 Malignant neoplasm of unspecified site of right female breast: Secondary | ICD-10-CM | POA: Diagnosis not present

## 2020-06-02 DIAGNOSIS — N952 Postmenopausal atrophic vaginitis: Secondary | ICD-10-CM | POA: Diagnosis not present

## 2020-06-02 DIAGNOSIS — D509 Iron deficiency anemia, unspecified: Secondary | ICD-10-CM | POA: Diagnosis not present

## 2020-06-02 DIAGNOSIS — K219 Gastro-esophageal reflux disease without esophagitis: Secondary | ICD-10-CM | POA: Diagnosis not present

## 2020-06-02 DIAGNOSIS — E7800 Pure hypercholesterolemia, unspecified: Secondary | ICD-10-CM | POA: Diagnosis not present

## 2020-06-02 DIAGNOSIS — R32 Unspecified urinary incontinence: Secondary | ICD-10-CM | POA: Diagnosis not present

## 2020-06-02 DIAGNOSIS — Z23 Encounter for immunization: Secondary | ICD-10-CM | POA: Diagnosis not present

## 2020-06-07 NOTE — Progress Notes (Signed)
[facility]  Telephone:([phone]) [phone] Fax:(336) [phone]     ID: Meghan Padilla DOB: [birth year]  MR#: [id number]  NUU#:[id number]  Patient Care Team: [name], MD as PCP - General (Family Medicine) [name], [name], MD as Consulting Physician (Oncology) Erroll Luna, MD as Consulting Physician (General Surgery) Gery Pray, MD as Consulting Physician (Radiation Oncology) Arvella Nigh, MD (Obstetrics and Gynecology) Franchot Gallo, MD as Consulting Physician (Urology) Wilford Corner, MD as Consulting Physician (Gastroenterology) Delice Bison, [name], NP as Nurse Practitioner (Hematology and Oncology) OTHER MD:  CHIEF COMPLAINT: Estrogen receptor positive breast cancer  CURRENT TREATMENT: Anastrozole   INTERVAL HISTORY: Meghan Padilla returns today for follow-up of her estrogen receptor positive breast cancer.  She was last seen on 10/29/2019 for a new breast nodule. She proceeded to right diagnostic mammogram and right breast ultrasound on 11/05/2019 showing: breast density category C; 1.1 cm ill-defined area in superior right breast adjacent to the lumpectomy site; increasing skin retraction; 1.6 cm oil cyst at lumpectomy site; normal-appearing right axillary lymph nodes.  She proceeded to biopsy of the suspicious right breast area on 11/12/2019. Pathology [phone]) showed fat necrosis.  Breast MRI was recommended and subsequently performed on 12/01/2019 showing: breast composition B; probably-benign findings in association with right breast lumpectomy site. Repeat MRI was recommended in 6 months. This is already scheduled for 06/23/2020.  She also underwent routine bilateral diagnostic mammography with tomography at [facility] on 03/10/2020 showing: breast density category C; probably-benign right lumpectomy bed calcifications; no evidence of malignancy in left breast. Short-term right breast follow up was recommended. This is scheduled for  09/11/2020.  She continues on anastrozole.  Her most recent bone density screening on 02/20/2018 at [facility] showed a T-score of -1.6, which is considered osteopenic.   REVIEW OF SYSTEMS: Meghan Padilla is greatly enjoying her 2-monthold granddaughter whom she cares for on a regular basis.  She received both doses of the Pfizer vaccine and is considering the booster.  Aside from these issues a detailed review of systems today was benign.    BREAST CANCER HISTORY: From the original intake note:  DAreyaroutine [facility] showing a possible right breast distortion and or mass and possibly a right axillary lymph node. She was recalled for diagnostic right mammography with tomography and ultrasonography [facility]. This found her right breast density to be category C. In the upper right breast there was a site of distortion. The mass of concern was consistent with an intramamm [name]. On exam there was no palpable abnormality. Ultrasonography found a hypoechoic irregular mass with spiculated margins at the 12:00 position of the right breast measuring 0.5 cm. Ultrasound of the right axilla showed multiple normal-appearing lymph nodes.  Biopsy of the right breast mass in question 02/29/2016 showed orifices [id number] and invasive ductal carcinoma, grade 2, estrogen receptor 95% positive, with strong staining intensity, progesterone receptor 2% positive, with strong staining intensity, with an MIB-1 of 10%, and no HER-2 amplification, the signals ratio being 1.48 and the number per cell 2.30.  Her subsequent history is as detailed below.   PAST MEDICAL HISTORY: Past Medical History:  Diagnosis Date  . Breast cancer ([facility]   . [facility]   right breast  . Diabetes mellitus   . GERD (gastroesophageal reflux disease)   . History of radiation therapy 05/04/16-06/01/16   right breast 42.72 Gy boost to  10 Gy  .  Hyperlipidemia   . Hypertension   . Personal history of radiation therapy 05/05/2016   ended radiaiton 06-01-2016  . UTI (urinary tract infection)     PAST SURGICAL HISTORY: Past Surgical History:  Procedure Laterality Date  . BREAST BIOPSY Right 02/29/2016  . BREAST BIOPSY Right 11/12/2019  . BREAST LUMPECTOMY Right 03/24/2016   right 03/2016  . DILATION AND CURETTAGE OF UTERUS    . HAND SURGERY    . HAND SURGERY    . HYSTEROSCOPY WITH D & C  11/28/2008  . RADIOACTIVE SEED GUIDED PARTIAL MASTECTOMY WITH AXILLARY SENTINEL LYMPH NODE BIOPSY Right 03/24/2016   Procedure: RADIOACTIVE SEED GUIDED PARTIAL MASTECTOMY WITH AXILLARY SENTINEL LYMPH NODE BIOPSY;  Surgeon: Erroll Luna, MD;  Location: [facility];  Service: General;  Laterality: Right;  . TONSILLECTOMY      FAMILY HISTORY Family History  Problem Relation Age of Onset  . Cancer Neg Hx   The patient's parents are still living, in their late 61s. The patient had no brothers, 2 sisters. There is no history of breast or ovarian or any other cancer in the family to the patient's knowledge    GYNECOLOGIC HISTORY:  No LMP recorded. Patient is postmenopausal.  menarche age 69, first live birth age 49. The patient went through menopause in her early 23s. She did not take hormone replacement. 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 Her husband [name] was a Dealer at [employer]. He is retired and disabled after multiple back surgeries. Daughter Ailani Governale lives in [place] and is disabled secondary to cognitive and behavioral issues. Son Martinique Rahimi lives in [place] where he does car painting and body work.  He recently married into the [place] family and the patient now has a step 46 year old granddaughter that she is helping during the virtual school months.  She is  a Tourist information centre manager.    ADVANCED DIRECTIVES: Not in place    HEALTH MAINTENANCE: Social History   Tobacco Use  . Smoking status: Never Smoker  . Smokeless tobacco: Never Used  Substance Use Topics  . Alcohol use: No  . Drug use: No     Colonoscopy: Dr. Michail Sermon  PAP:  Bone density:   Allergies  Allergen Reactions  . Sulfa Antibiotics Nausea Only    Current Outpatient Medications  Medication Sig Dispense Refill  . anastrozole (ARIMIDEX) 1 MG tablet TAKE 1 TABLET BY MOUTH EVERY DAY 90 tablet 1  . atorvastatin (LIPITOR) 40 MG tablet Take 40 mg by mouth daily.    . metFORMIN (GLUCOPHAGE) 500 MG tablet Take 4 tablets (2,000 mg total) by mouth at bedtime.    Marland Kitchen omeprazole (PRILOSEC) 20 MG capsule Take 20 mg by mouth daily.    . Probiotic Product (PROBIOTIC DAILY) CAPS Take by mouth.    . quinapril (ACCUPRIL) 20 MG tablet Take 20 mg by mouth at bedtime.    . tolterodine (DETROL LA) 4 MG 24 hr capsule TAKE 1 CAPSULE BY MOUTH EVERY DAY 90 capsule 3   No current facility-administered medications for this visit.    OBJECTIVE: White woman who appears well  Vitals:   06/08/20 1136  BP: (!) 145/77  Pulse: 87  Resp: 18  Temp: 97.8 F (36.6 C)  SpO2: 98%   Wt Readings from Last 3 Encounters:  06/08/20 128 lb 6.4 oz (58.2 kg)  10/29/19 131  lb 4.8 oz (59.6 kg)  06/06/19 128 lb 4.8 oz (58.2 kg)   Body mass index is 24.26 kg/m.    ECOG FS:1 - Symptomatic but completely ambulatory  Sclerae unicteric, EOMs intact Wearing a mask No cervical or supraclavicular adenopathy Lungs no rales or rhonchi Heart regular rate and rhythm Abd soft, nontender, positive bowel sounds MSK no focal spinal tenderness, no upper extremity lymphedema Neuro: nonfocal, well oriented, appropriate affect Breasts: The right breast has a significant nodule in the superior aspect which was recently biopsied.  Left breast is benign.  Both axillae are benign.   LAB RESULTS:  CMP     Component Value  Date/Time   NA 142 06/06/2019 1112   NA 142 04/10/2017 1329   K 3.7 06/06/2019 1112   K 3.5 04/10/2017 1329   CL 106 06/06/2019 1112   CO2 24 06/06/2019 1112   CO2 31 (H) 04/10/2017 1329   GLUCOSE 96 06/06/2019 1112   GLUCOSE 174 (H) 04/10/2017 1329   BUN 11 06/06/2019 1112   BUN 7.3 04/10/2017 1329   CREATININE 0.72 06/06/2019 1112   CREATININE 0.8 04/10/2017 1329   CALCIUM 9.1 06/06/2019 1112   CALCIUM 9.4 04/10/2017 1329   PROT 6.8 06/06/2019 1112   PROT 6.4 04/10/2017 1329   ALBUMIN 4.0 06/06/2019 1112   ALBUMIN 3.6 04/10/2017 1329   AST 19 06/06/2019 1112   AST 22 04/10/2017 1329   ALT 17 06/06/2019 1112   ALT 20 04/10/2017 1329   ALKPHOS 83 06/06/2019 1112   ALKPHOS 78 04/10/2017 1329   BILITOT 0.3 06/06/2019 1112   BILITOT 0.25 04/10/2017 1329   GFRNONAA >60 06/06/2019 1112   GFRAA >60 06/06/2019 1112    INo results found for: SPEP, UPEP  Lab Results  Component Value Date   WBC 8.4 06/08/2020   NEUTROABS 5.2 06/08/2020   HGB 14.5 06/08/2020   HCT 43.8 06/08/2020   MCV 92.4 06/08/2020   PLT 245 06/08/2020      Chemistry      Component Value Date/Time   NA 142 06/06/2019 1112   NA 142 04/10/2017 1329   K 3.7 06/06/2019 1112   K 3.5 04/10/2017 1329   CL 106 06/06/2019 1112   CO2 24 06/06/2019 1112   CO2 31 (H) 04/10/2017 1329   BUN 11 06/06/2019 1112   BUN 7.3 04/10/2017 1329   CREATININE 0.72 06/06/2019 1112   CREATININE 0.8 04/10/2017 1329      Component Value Date/Time   CALCIUM 9.1 06/06/2019 1112   CALCIUM 9.4 04/10/2017 1329   ALKPHOS 83 06/06/2019 1112   ALKPHOS 78 04/10/2017 1329   AST 19 06/06/2019 1112   AST 22 04/10/2017 1329   ALT 17 06/06/2019 1112   ALT 20 04/10/2017 1329   BILITOT 0.3 06/06/2019 1112   BILITOT 0.25 04/10/2017 1329       No results found for: LABCA2  No components found for: LABCA125  No results for input(s): INR in the last 168 hours.  Urinalysis    Component Value Date/Time   COLORURINE YELLOW  01/28/2012 1952   APPEARANCEUR CLEAR 01/28/2012 1952   LABSPEC 1.006 01/28/2012 1952   PHURINE 7.0 01/28/2012 1952   GLUCOSEU NEGATIVE 01/28/2012 1952   HGBUR NEGATIVE 01/28/2012 1952   BILIRUBINUR NEGATIVE 01/28/2012 1952   KETONESUR NEGATIVE 01/28/2012 1952   PROTEINUR NEGATIVE 01/28/2012 1952   UROBILINOGEN 0.2 01/28/2012 1952   NITRITE NEGATIVE 01/28/2012 1952   LEUKOCYTESUR TRACE (A) 01/28/2012 1952    STUDIES:  No results found.   ELIGIBLE FOR AVAILABLE RESEARCH PROTOCOL: no  ASSESSMENT: 69 y.o. [place] woman status post right breast upper outer quadrant biopsy 02/29/2016 for a clinical T1a N0, stage IA invasive ductal carcinoma, grade 2, estrogen receptor positive, progesterone receptor positive at 2%, HER-2 nonamplified, with an MIB-1 of 10%  (1) right lumpectomy and sentinel lymph node sampling 03/24/2016 confirmed a pT1c pN0, stage IA invasive lobular carcinoma, grade 2, with negative margins; a total of 3 sentinel lymph nodes were removed.  (2) Oncotype DX score of 16 ("low risk" "), predicts a risk of outside the breast recurrence within 10 years of 10% if the patient's only systemic therapy is tamoxifen for 5 years. It also predicts no benefit from adjuvant chemotherapy.  (3) adjuvant radiation 05/04/16-06/01/16:  52.72 Gy to the right breast+boost  (4) started anastrozole 07/22/2016  (a) Vitamin D level 23.3 on 10/04/2016  (b) bone density 02/20/2018 at Dr. Ophelia Charter 2019 showed a T score of -1.6 this is stable).   PLAN: Meghan Padilla is now just over 4 years out from definitive surgery for her breast cancer with no evidence of disease recurrence.  This is very favorable.  She is tolerating anastrozole well and the plan is to continue that 1 more year.  Biopsy of the right breast nodule showed fat necrosis.  She is however scheduled for a repeat MRI of the breast next month just to give [place] a new baseline and to make sure there are no other developments.  Her mammogram in  January therefore will be moved to May, 6 months later.  She will see me August 2022.  At that visit she will be ready to "graduate".  Total encounter time 25 minutes.Sarajane Jews C. Rosaleen Mazer, MD 06/08/20 11:50 AM Medical Oncology and Hematology [facility] [place], [place] [address] Tel. [phone]    Fax. [phone]   I, Wilburn Mylar, am acting as scribe for Dr. [name]. Meghan Padilla.  I, Lurline Del MD, have reviewed the above documentation for accuracy and completeness, and I agree with the above.    *Total Encounter Time as defined by the Centers for Medicare and Medicaid Services includes, in addition to the face-to-face time of a patient visit (documented in the note above) non-face-to-face time: obtaining and reviewing outside history, ordering and reviewing medications, tests or procedures, care coordination (communications with other health care professionals or caregivers) and documentation in the medical record.

## 2020-06-08 ENCOUNTER — Telehealth: Payer: Self-pay

## 2020-06-08 ENCOUNTER — Inpatient Hospital Stay: Payer: Medicare Other

## 2020-06-08 ENCOUNTER — Other Ambulatory Visit: Payer: Self-pay

## 2020-06-08 ENCOUNTER — Inpatient Hospital Stay: Payer: Medicare Other | Attending: Oncology | Admitting: Oncology

## 2020-06-08 VITALS — BP 145/77 | HR 87 | Temp 97.8°F | Resp 18 | Ht 61.0 in | Wt 128.4 lb

## 2020-06-08 DIAGNOSIS — Z923 Personal history of irradiation: Secondary | ICD-10-CM | POA: Diagnosis not present

## 2020-06-08 DIAGNOSIS — Z79899 Other long term (current) drug therapy: Secondary | ICD-10-CM | POA: Insufficient documentation

## 2020-06-08 DIAGNOSIS — Z79811 Long term (current) use of aromatase inhibitors: Secondary | ICD-10-CM | POA: Insufficient documentation

## 2020-06-08 DIAGNOSIS — E538 Deficiency of other specified B group vitamins: Secondary | ICD-10-CM

## 2020-06-08 DIAGNOSIS — E119 Type 2 diabetes mellitus without complications: Secondary | ICD-10-CM | POA: Insufficient documentation

## 2020-06-08 DIAGNOSIS — C50911 Malignant neoplasm of unspecified site of right female breast: Secondary | ICD-10-CM | POA: Insufficient documentation

## 2020-06-08 DIAGNOSIS — I10 Essential (primary) hypertension: Secondary | ICD-10-CM | POA: Diagnosis not present

## 2020-06-08 DIAGNOSIS — D528 Other folate deficiency anemias: Secondary | ICD-10-CM

## 2020-06-08 DIAGNOSIS — C50411 Malignant neoplasm of upper-outer quadrant of right female breast: Secondary | ICD-10-CM

## 2020-06-08 DIAGNOSIS — Z170 Estrogen receptor positive status [ER+]: Secondary | ICD-10-CM | POA: Insufficient documentation

## 2020-06-08 LAB — CBC WITH DIFFERENTIAL/PLATELET
Abs Immature Granulocytes: 0.01 10*3/uL (ref 0.00–0.07)
Basophils Absolute: 0.1 10*3/uL (ref 0.0–0.1)
Basophils Relative: 1 %
Eosinophils Absolute: 0.4 10*3/uL (ref 0.0–0.5)
Eosinophils Relative: 5 %
HCT: 43.8 % (ref 36.0–46.0)
Hemoglobin: 14.5 g/dL (ref 12.0–15.0)
Immature Granulocytes: 0 %
Lymphocytes Relative: 27 %
Lymphs Abs: 2.3 10*3/uL (ref 0.7–4.0)
MCH: 30.6 pg (ref 26.0–34.0)
MCHC: 33.1 g/dL (ref 30.0–36.0)
MCV: 92.4 fL (ref 80.0–100.0)
Monocytes Absolute: 0.5 10*3/uL (ref 0.1–1.0)
Monocytes Relative: 6 %
Neutro Abs: 5.2 10*3/uL (ref 1.7–7.7)
Neutrophils Relative %: 61 %
Platelets: 245 10*3/uL (ref 150–400)
RBC: 4.74 MIL/uL (ref 3.87–5.11)
RDW: 12.8 % (ref 11.5–15.5)
WBC: 8.4 10*3/uL (ref 4.0–10.5)
nRBC: 0 % (ref 0.0–0.2)

## 2020-06-08 LAB — VITAMIN B12: Vitamin B-12: 81 pg/mL — ABNORMAL LOW (ref 180–914)

## 2020-06-08 LAB — FOLATE: Folate: 17.8 ng/mL (ref >5.9)

## 2020-06-08 LAB — FERRITIN: Ferritin: 82 ng/mL (ref 11–307)

## 2020-06-08 NOTE — Telephone Encounter (Signed)
Per Dr Jana Hakim, I called to let [facility] know pt will need DG MM scheduled for May 2022 instead of January 2022. Breast center will call pt to arrange.

## 2020-06-09 ENCOUNTER — Other Ambulatory Visit: Payer: Self-pay | Admitting: Oncology

## 2020-06-09 ENCOUNTER — Encounter: Payer: Self-pay | Admitting: Oncology

## 2020-06-09 NOTE — Progress Notes (Signed)
I called the niece and let her know the results of her B12 which are very low.  She needs supplementation monthly.  She tells me she would prefer to receive those at her primary care doctor's office instead of here.  I have sent Dr. [name] a letter both by epic and on paper.  The patient will arrange for supplementation through that office at her discretion.

## 2020-06-11 ENCOUNTER — Telehealth: Payer: Self-pay | Admitting: Oncology

## 2020-06-11 DIAGNOSIS — R3915 Urgency of urination: Secondary | ICD-10-CM | POA: Diagnosis not present

## 2020-06-11 DIAGNOSIS — R350 Frequency of micturition: Secondary | ICD-10-CM | POA: Diagnosis not present

## 2020-06-11 LAB — METHYLMALONIC ACID, SERUM: Methylmalonic Acid, Quantitative: 196 nmol/L (ref 0–378)

## 2020-06-11 NOTE — Telephone Encounter (Signed)
Left message with patient's husband [name]

## 2020-06-16 DIAGNOSIS — E538 Deficiency of other specified B group vitamins: Secondary | ICD-10-CM | POA: Diagnosis not present

## 2020-06-23 ENCOUNTER — Other Ambulatory Visit: Payer: Self-pay

## 2020-06-23 ENCOUNTER — Ambulatory Visit
Admission: RE | Admit: 2020-06-23 | Discharge: 2020-06-23 | Disposition: A | Discharge status: Home / Self Care | Payer: Medicare Other | Source: Ambulatory Visit | Attending: Adult Health | Admitting: Adult Health

## 2020-06-23 ENCOUNTER — Other Ambulatory Visit: Payer: Self-pay | Admitting: Oncology

## 2020-06-23 DIAGNOSIS — N6489 Other specified disorders of breast: Secondary | ICD-10-CM | POA: Diagnosis not present

## 2020-06-23 DIAGNOSIS — C50411 Malignant neoplasm of upper-outer quadrant of right female breast: Secondary | ICD-10-CM

## 2020-06-23 DIAGNOSIS — Z170 Estrogen receptor positive status [ER+]: Secondary | ICD-10-CM

## 2020-06-23 DIAGNOSIS — Z853 Personal history of malignant neoplasm of breast: Secondary | ICD-10-CM | POA: Diagnosis not present

## 2020-06-23 IMAGING — MR MR BREAST BILAT WO/W CM
8 of 12 series · 32 of 48 positions shown · IV contrast (6ml Gadavist)
Comparison: Previous exam(s).

CLINICAL DATA: 68-year-old female with history of right breast
cancer in [8V] status post lumpectomy and radiation. Patient had a
benign biopsy of the lumpectomy site in [DATE] demonstrating fat
necrosis.

LABS:  None.
EXAM:
BILATERAL BREAST MRI WITH AND WITHOUT CONTRAST
TECHNIQUE: Multiplanar, multisequence MR images of both breasts were obtained
prior to and following the intravenous administration of 6 ml of
Gadavist

[Series 2: t2_tirm_tra ipat (a-p) · axial · 3.0mm · 0.70mm/px · 1 of 59 slices shown]
[im 1/59]
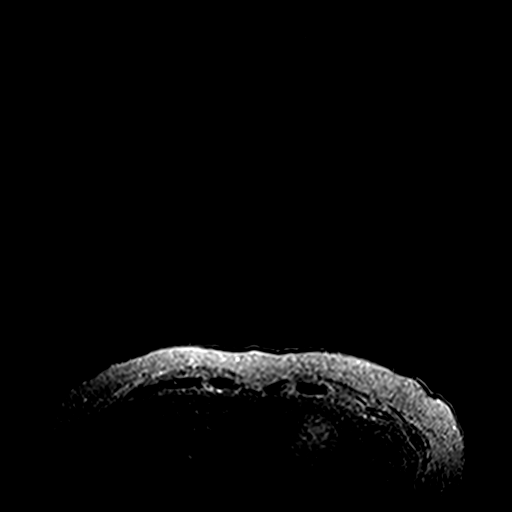

[Series 3: fl3d pre-cm no · axial · non-contrast · 1.2mm · 0.94mm/px · z∈[-77,+114]mm · 5 of 160 slices shown]
[im 1/160]
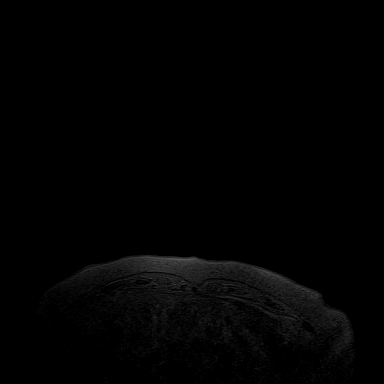
[im 40/160]
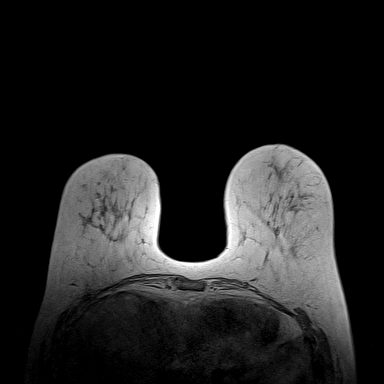
[im 80/160]
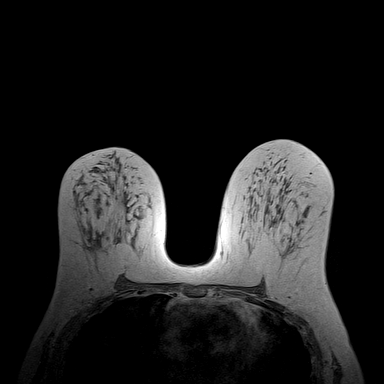
[im 120/160]
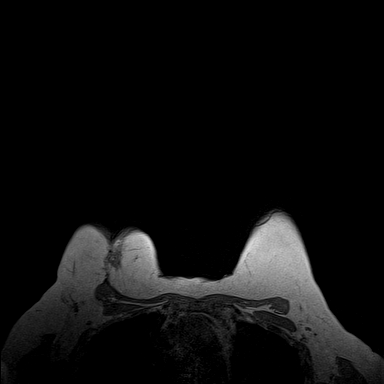
[im 160/160]
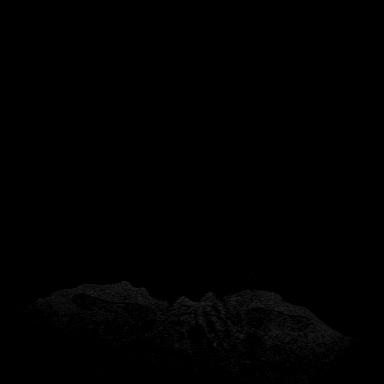

[Series 4: fl3d pre-cm · axial · non-contrast · 1.2mm · 0.94mm/px · z∈[-77,+114]mm · 5 of 160 slices shown]
[im 1/160]
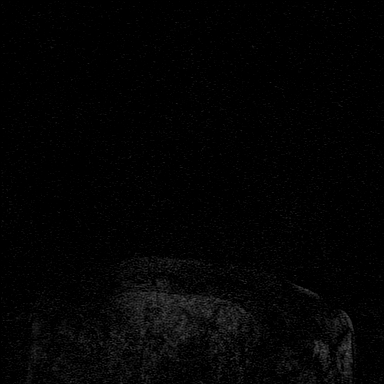
[im 40/160]
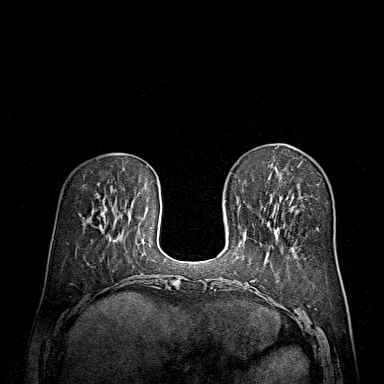
[im 80/160]
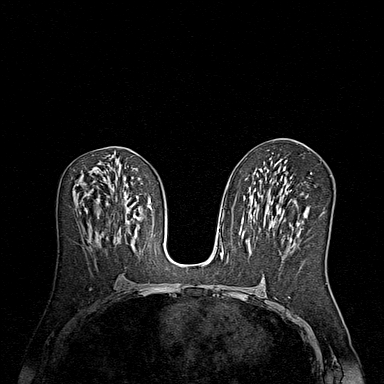
[im 120/160]
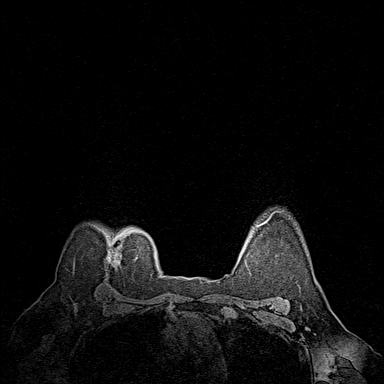
[im 160/160]
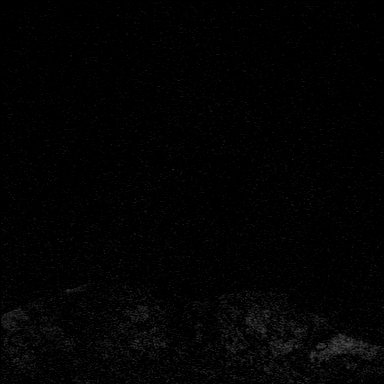

[Series 5: fl3d post-cm 20 · axial · 1.2mm · 0.94mm/px · z∈[-77,+114]mm · 5 of 160 slices shown (1 of 3)]
[im 1/160]
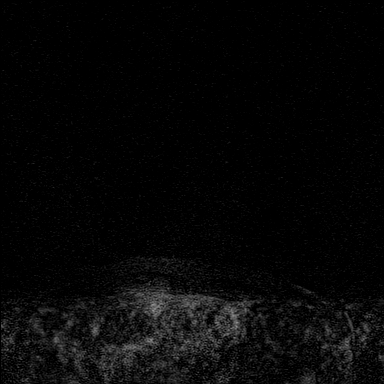
[im 40/160]
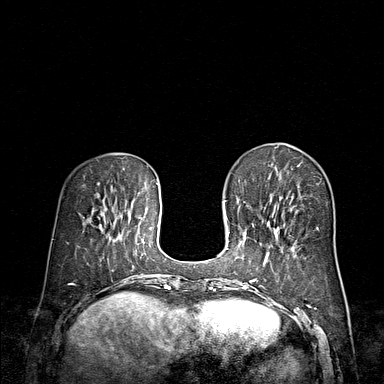
[im 80/160]
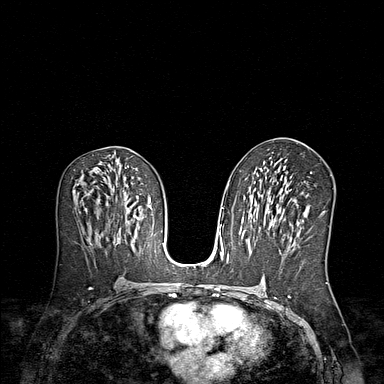
[im 120/160]
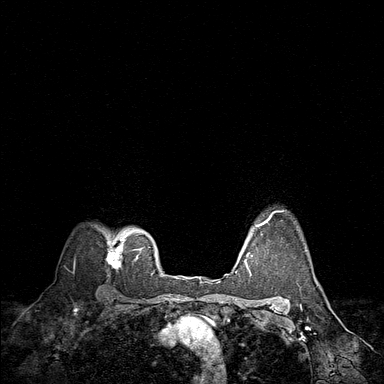
[im 160/160]
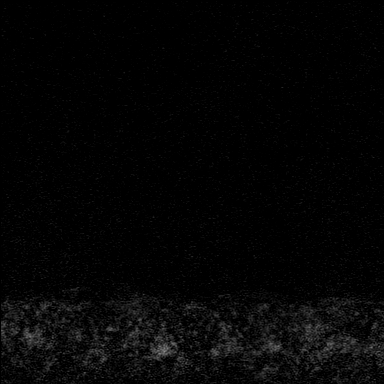

[Series 6: fl3d post-cm 20 · axial · 1.2mm · 0.94mm/px · z∈[-77,+114]mm · 5 of 160 slices shown (2 of 3)]
[im 1/160]
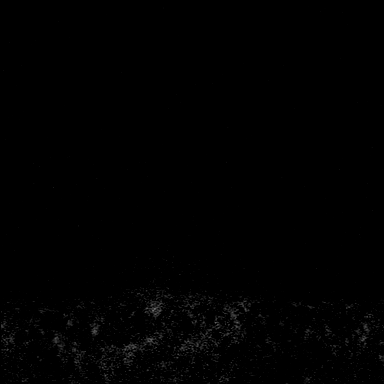
[im 40/160]
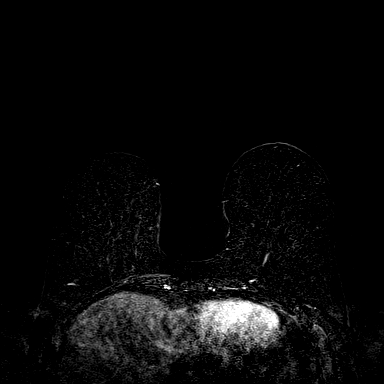
[im 80/160]
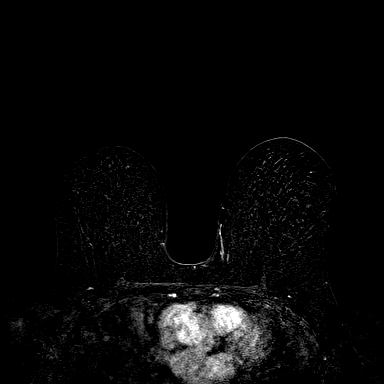
[im 120/160]
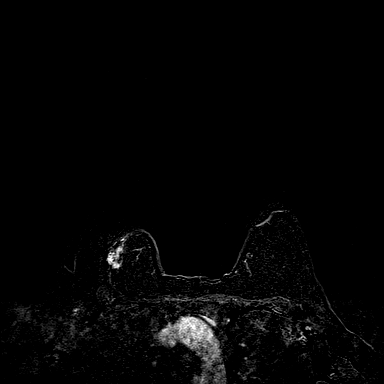
[im 160/160]
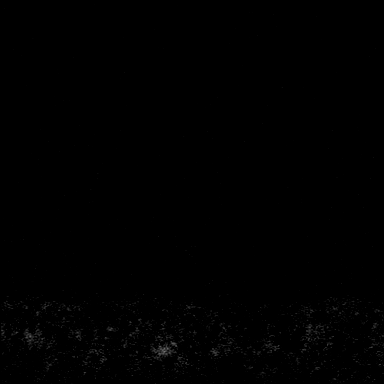

[Series 7: fl3d post-cm 20 · axial · 192.0mm · 0.94mm/px · 1 of 1 slices shown (3 of 3)]
[im 1/1]
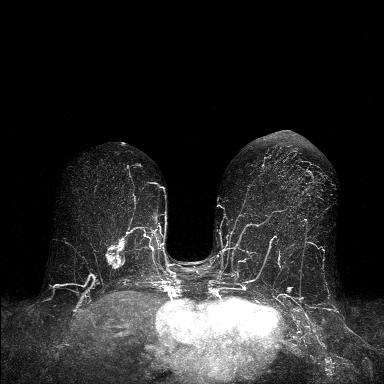

[Series 8: fl3d post-cm 3min · axial · 1.2mm · 0.94mm/px · z∈[-77,+114]mm · 6 of 160 slices shown]
[im 1/160]
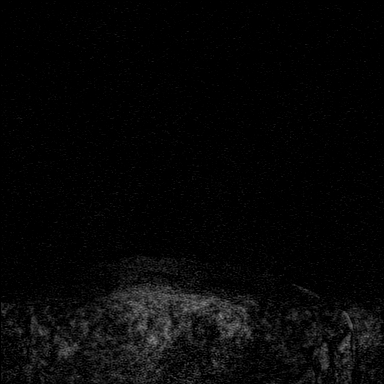
[im 32/160]
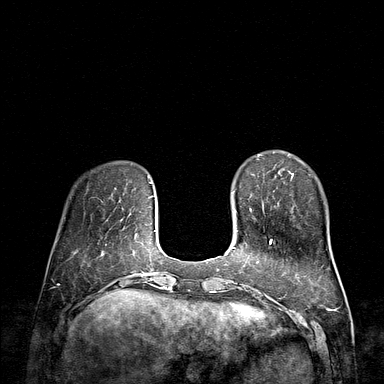
[im 64/160]
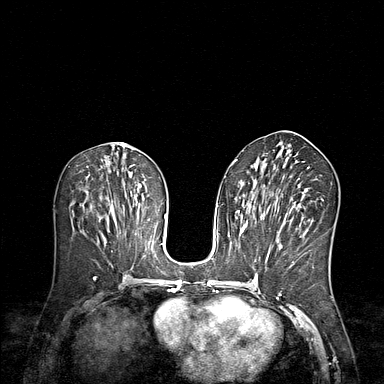
[im 96/160]
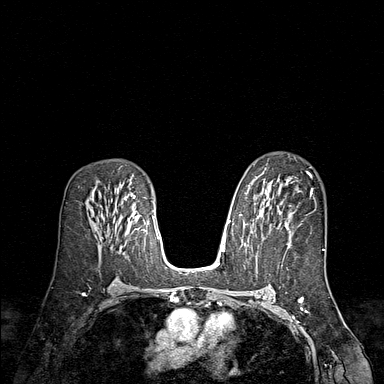
[im 128/160]
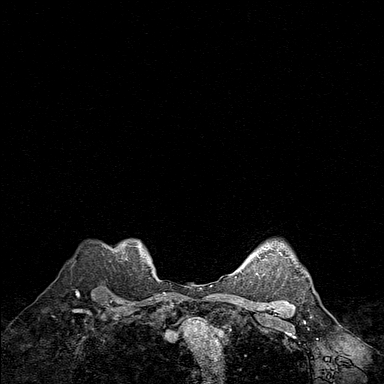
[im 160/160]
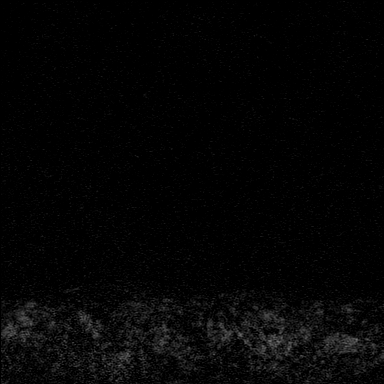

[Series 9: fl3d post-cm 3min_sub · axial · 1.2mm · 0.94mm/px · z∈[-77,+37]mm · 4 of 160 slices shown]
[im 1/160]
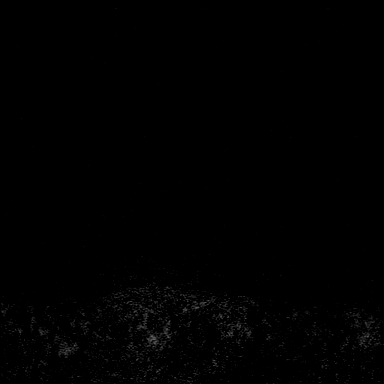
[im 32/160]
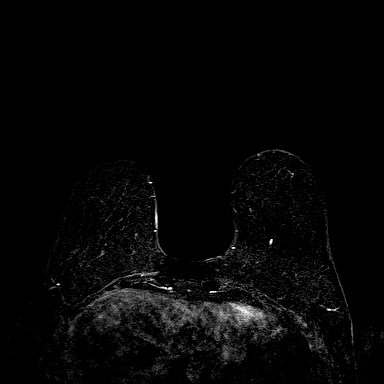
[im 64/160]
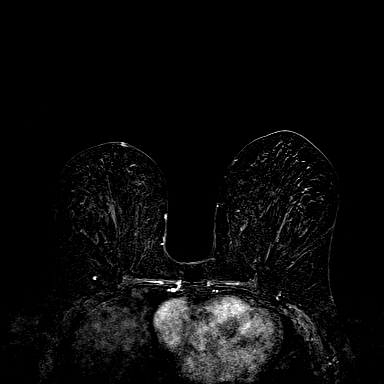
[im 96/160]
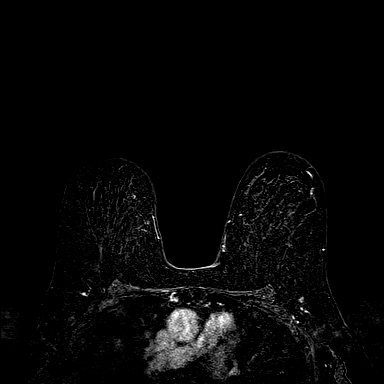

[32 of 48 positions shown; findings below may reference images not displayed]

Three-dimensional MR images were rendered by post-processing of the
original MR data on an independent workstation. The
three-dimensional MR images were interpreted, and findings are
reported in the following complete MRI report for this study. Three
dimensional images were evaluated at the independent interpreting
workstation using the DynaCAD thin client.
FINDINGS: Breast composition: b. Scattered fibroglandular tissue.

Background parenchymal enhancement: Minimal

Right breast: There is increased peripheral clumped enhancement
along the superior and anterior lumpectomy site. This measures
approximately 2.9 x 1.1 x 0.8 cm (series 6, image 42). The
enhancement abuts but does not appear to directly involve the skin.
There are no additional areas of abnormal enhancement elsewhere in
the right breast.

Left breast: No mass or abnormal enhancement.

Lymph nodes: No abnormal appearing lymph nodes.

Ancillary findings:  None.
IMPRESSION: 1. Increased peripheral enhancement along the superior and anterior
lumpectomy site in the right breast, measuring approximately 2.9 x
1.1 x 0.8 cm. While this could represent progression of fat
necrosis, malignancy cannot be excluded.
2. No MRI evidence of malignancy in the left breast.

RECOMMENDATION:
MRI guided biopsy of the enhancement at the lumpectomy site in the
right breast. Recommend targeting the anterior aspect of the
enhancement.

BI-RADS CATEGORY  4: Suspicious.

## 2020-06-23 MED ORDER — GADOBUTROL 1 MMOL/ML IV SOLN
6.0000 mL | Freq: Once | INTRAVENOUS | Status: AC | PRN
  Administered 2020-06-23: 6 mL via INTRAVENOUS

## 2020-06-23 NOTE — Progress Notes (Unsigned)
[id number]

## 2020-06-26 ENCOUNTER — Other Ambulatory Visit: Payer: Self-pay | Admitting: Oncology

## 2020-06-26 DIAGNOSIS — Z170 Estrogen receptor positive status [ER+]: Secondary | ICD-10-CM

## 2020-06-30 DIAGNOSIS — E538 Deficiency of other specified B group vitamins: Secondary | ICD-10-CM | POA: Diagnosis not present

## 2020-07-04 DIAGNOSIS — N3001 Acute cystitis with hematuria: Secondary | ICD-10-CM | POA: Diagnosis not present

## 2020-07-04 DIAGNOSIS — R399 Unspecified symptoms and signs involving the genitourinary system: Secondary | ICD-10-CM | POA: Diagnosis not present

## 2020-07-07 DIAGNOSIS — Z6824 Body mass index (BMI) 24.0-24.9, adult: Secondary | ICD-10-CM | POA: Diagnosis not present

## 2020-07-07 DIAGNOSIS — Z01419 Encounter for gynecological examination (general) (routine) without abnormal findings: Secondary | ICD-10-CM | POA: Diagnosis not present

## 2020-07-09 DIAGNOSIS — R350 Frequency of micturition: Secondary | ICD-10-CM | POA: Diagnosis not present

## 2020-07-14 ENCOUNTER — Ambulatory Visit
Admission: RE | Admit: 2020-07-14 | Discharge: 2020-07-14 | Disposition: A | Discharge status: Home / Self Care | Payer: Medicare Other | Source: Ambulatory Visit | Attending: Oncology | Admitting: Oncology

## 2020-07-14 ENCOUNTER — Other Ambulatory Visit: Payer: Self-pay

## 2020-07-14 ENCOUNTER — Other Ambulatory Visit: Payer: Self-pay | Admitting: Diagnostic Radiology

## 2020-07-14 DIAGNOSIS — Z170 Estrogen receptor positive status [ER+]: Secondary | ICD-10-CM

## 2020-07-14 DIAGNOSIS — R928 Other abnormal and inconclusive findings on diagnostic imaging of breast: Secondary | ICD-10-CM | POA: Diagnosis not present

## 2020-07-14 DIAGNOSIS — C50411 Malignant neoplasm of upper-outer quadrant of right female breast: Secondary | ICD-10-CM

## 2020-07-14 DIAGNOSIS — N6031 Fibrosclerosis of right breast: Secondary | ICD-10-CM | POA: Diagnosis not present

## 2020-07-14 HISTORY — PX: BREAST BIOPSY: SHX20

## 2020-07-14 IMAGING — MR MR BREAST BX W/ LOC DEV 1ST LEASION IMAGE BX SPEC MR GUIDE*R*
8 of 12 series · 32 of 48 positions shown · IV contrast (7ml Gadavist)
Comparison: Previous exams.
COMPARISON: Previous exams.

Addendum:
CLINICAL DATA: History of RIGHT breast cancer in [RN] status post
lumpectomy and radiation therapy. Patient had a benign biopsy of the
lumpectomy site in [DATE] demonstrating fat necrosis.

Recent breast MRI of [DATE] described nodular peripheral
enhancement along the superior anterior lumpectomy site in the RIGHT
breast for which MRI guided biopsy was recommended. Patient presents
today for the MRI-guided biopsy.
EXAM:
MRI GUIDED CORE NEEDLE BIOPSY OF THE RIGHT BREAST
TECHNIQUE: Multiplanar, multisequence MR imaging of the RIGHT breast was
performed both before and after administration of intravenous
contrast.
CONTRAST:  6mL GADAVIST GADOBUTROL 1 MMOL/ML IV SOLN

[Series 7: fiducial unilateral · sagittal · 2.0mm · 1.33mm/px · 3 of 72 slices shown]
[im 1/72]
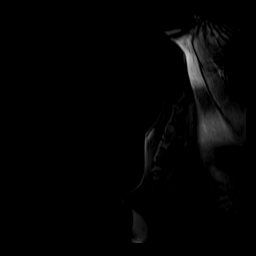
[im 36/72]
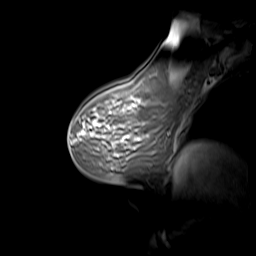
[im 72/72]
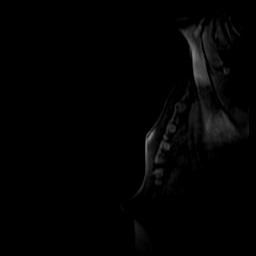

[Series 8: dynamic pre · axial · non-contrast · 1.3mm · 0.73mm/px · z∈[-74,+112]mm · 5 of 144 slices shown]
[im 1/144]
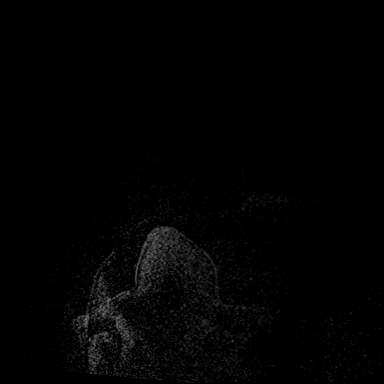
[im 36/144]
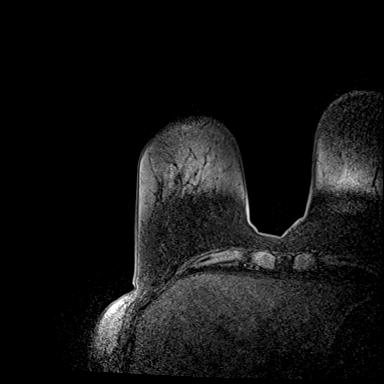
[im 72/144]
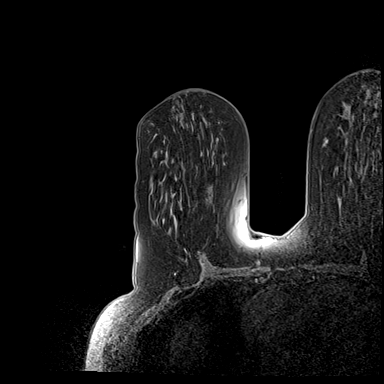
[im 108/144]
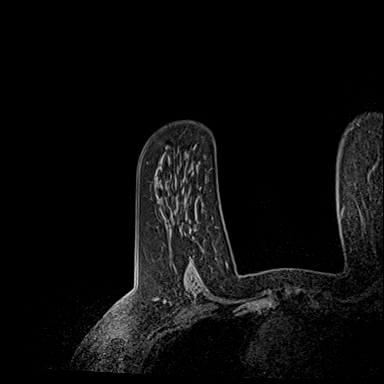
[im 144/144]
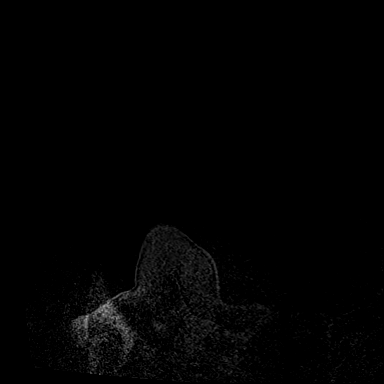

[Series 9: dynamic post 20 · axial · 1.3mm · 0.73mm/px · z∈[-74,+112]mm · 4 of 144 slices shown (1 of 2)]
[im 1/144]
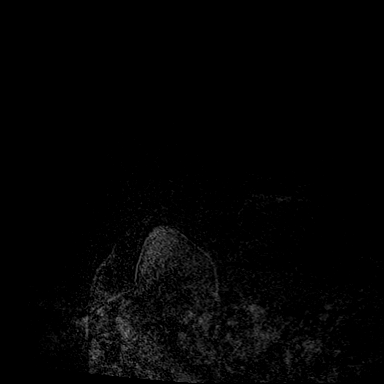
[im 48/144]
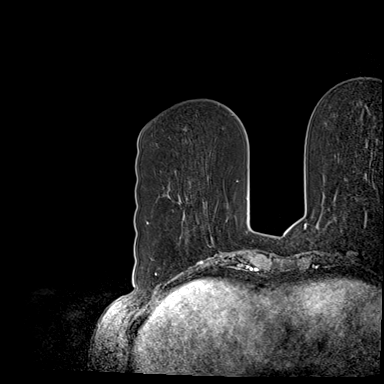
[im 96/144]
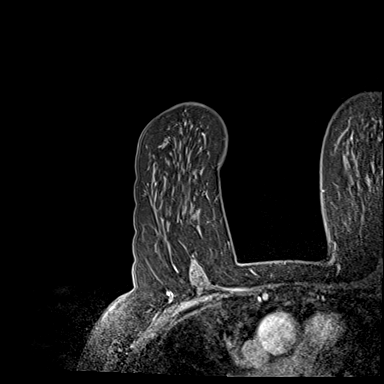
[im 144/144]
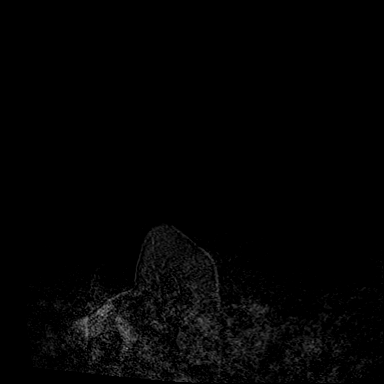

[Series 10: dynamic post 20 · axial · 1.3mm · 0.73mm/px · z∈[-74,+112]mm · 4 of 144 slices shown (2 of 2)]
[im 1/144]
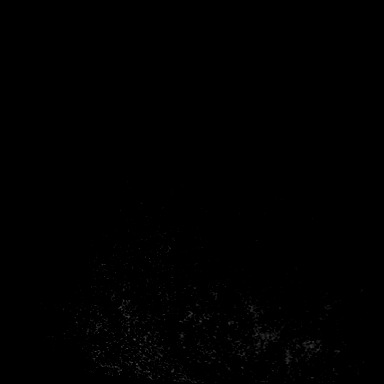
[im 48/144]
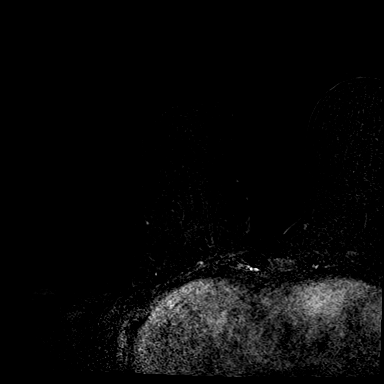
[im 96/144]
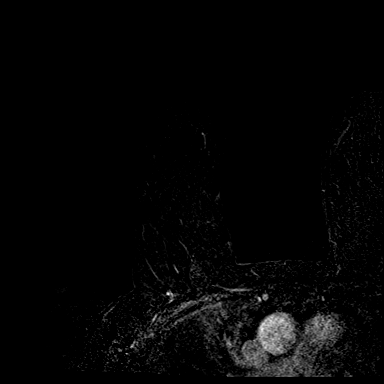
[im 144/144]
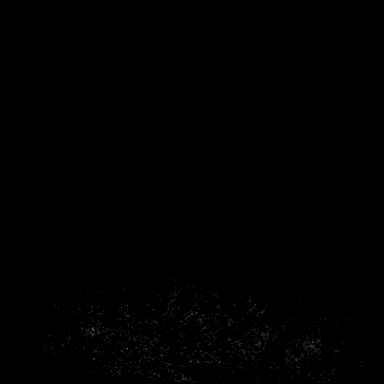

[Series 11: dynamic post 3 · axial · 1.3mm · 0.73mm/px · z∈[-74,+112]mm · 4 of 144 slices shown (1 of 2)]
[im 1/144]
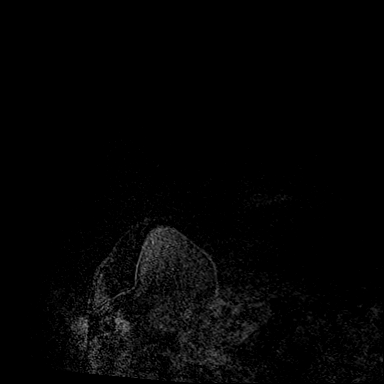
[im 48/144]
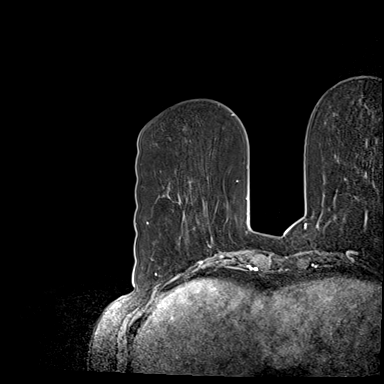
[im 96/144]
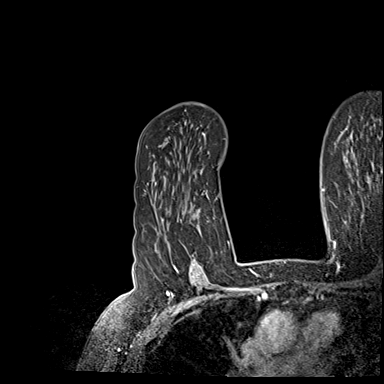
[im 144/144]
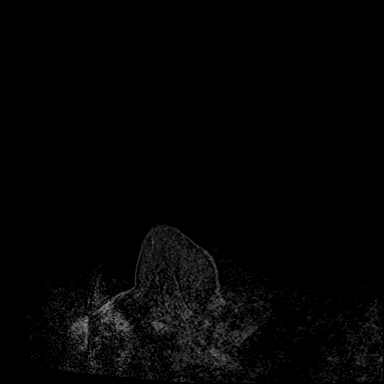

[Series 12: dynamic post 3 · axial · 1.3mm · 0.73mm/px · z∈[-74,+112]mm · 4 of 144 slices shown (2 of 2)]
[im 1/144]
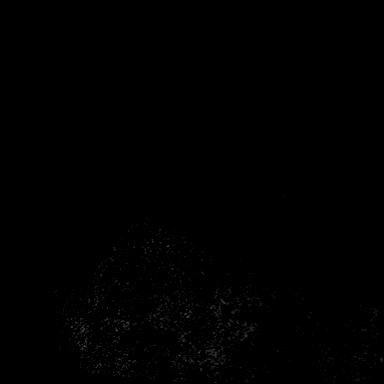
[im 48/144]
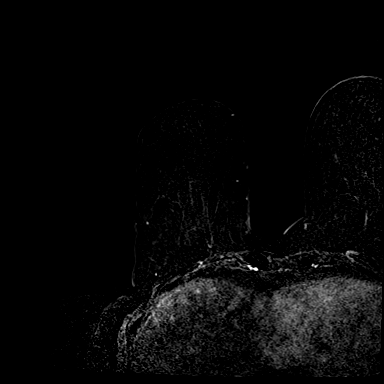
[im 96/144]
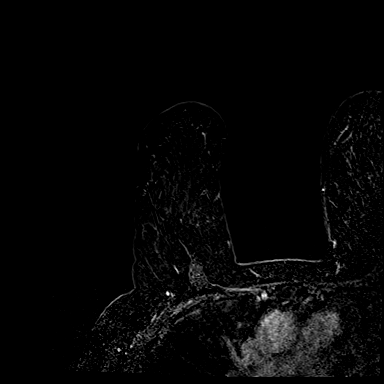
[im 144/144]
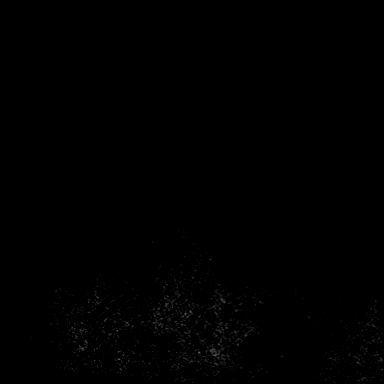

[Series 13: needle confirmation · axial · 1.3mm · 0.73mm/px · z∈[-74,+112]mm · 4 of 144 slices shown]
[im 1/144]
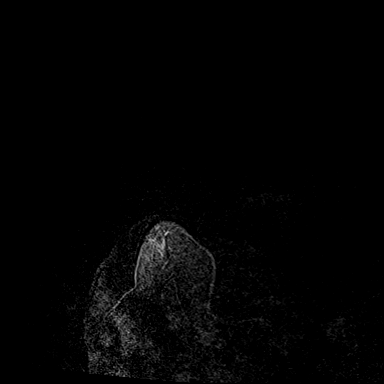
[im 48/144]
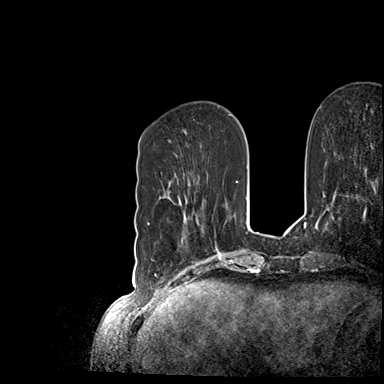
[im 96/144]
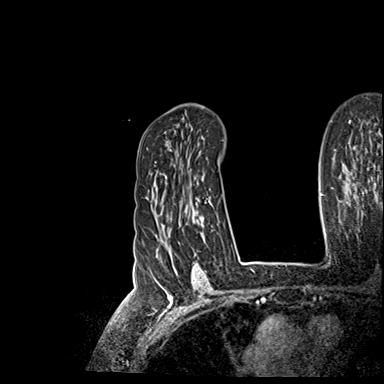
[im 144/144]
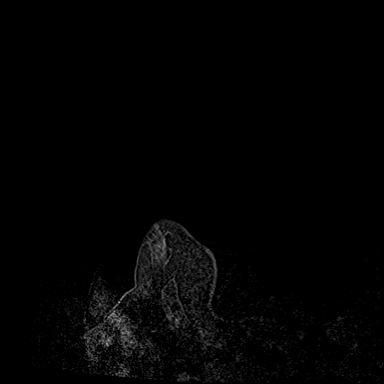

[Series 14: needle confirmation_sub · axial · 1.3mm · 0.73mm/px · z∈[-74,+112]mm · 4 of 144 slices shown]
[im 1/144]
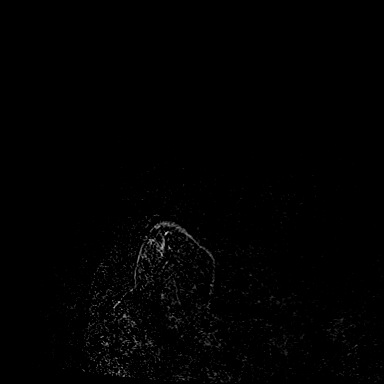
[im 48/144]
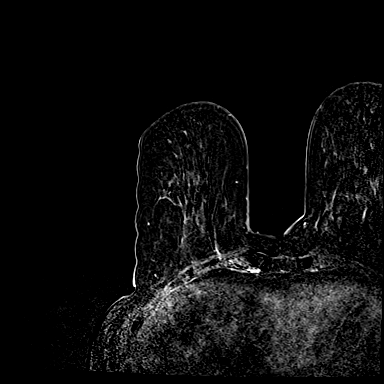
[im 96/144]
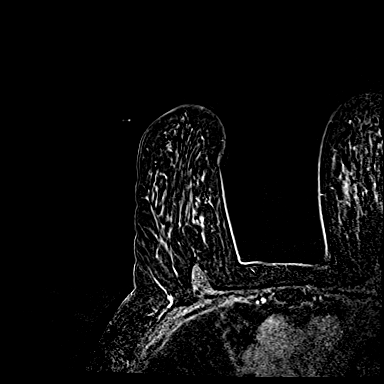
[im 144/144]
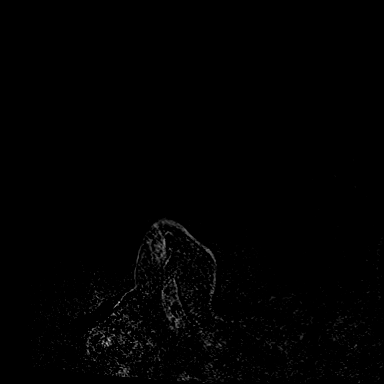

[32 of 48 positions shown; findings below may reference images not displayed]

FINDINGS: I met with the patient, and we discussed the procedure of MRI guided
biopsy, including risks, benefits, and alternatives. Specifically,
we discussed the risks of infection, bleeding, tissue injury, clip
migration, and inadequate sampling. Informed, written consent was
given. The usual time out protocol was performed immediately prior
to the procedure.

Using sterile technique, 1% Lidocaine, MRI guidance, and a 9 gauge
vacuum assisted device, biopsy was performed of the nodular
enhancement along the anterior margin of the lumpectomy bed using a
lateral approach. At the conclusion of the procedure, a barbell
shaped tissue marker clip was deployed into the biopsy cavity.
Follow-up 2-view mammogram was performed and dictated separately.
IMPRESSION: MRI guided biopsy of the abnormal nodular enhancement along the
anterior margin of the lumpectomy site in the upper RIGHT breast.
No apparent complications.

ADDENDUM:
Pathology revealed FAT NECROSIS AND FIBROSIS of the Right breast,
upper inner quadrant. Cytokeratin AE[DATE] does not highlight an
infiltrative epithelial component, supporting the absence of
malignancy. This was found to be concordant by Dr. ZHOU.

Pathology results were discussed with the patient by telephone. The
patient reported doing well after the biopsy with tenderness and
bruising at the site. Post biopsy instructions and care were
reviewed and questions were answered. The patient was encouraged to
call The [REDACTED] for any additional
concerns. My direct phone number was provided

The patient was instructed to return for annual diagnostic
mammography in [DATE] and would consider screening breast MRI in
[DATE].

Pathology results reported by ZHOU, RN on [DATE].

*** End of Addendum ***
FINDINGS: I met with the patient, and we discussed the procedure of MRI guided
biopsy, including risks, benefits, and alternatives. Specifically,
we discussed the risks of infection, bleeding, tissue injury, clip
migration, and inadequate sampling. Informed, written consent was
given. The usual time out protocol was performed immediately prior
to the procedure.

Using sterile technique, 1% Lidocaine, MRI guidance, and a 9 gauge
vacuum assisted device, biopsy was performed of the nodular
enhancement along the anterior margin of the lumpectomy bed using a
lateral approach. At the conclusion of the procedure, a barbell
shaped tissue marker clip was deployed into the biopsy cavity.
Follow-up 2-view mammogram was performed and dictated separately.
IMPRESSION: MRI guided biopsy of the abnormal nodular enhancement along the
anterior margin of the lumpectomy site in the upper RIGHT breast.
No apparent complications.

## 2020-07-14 IMAGING — MG MM BREAST LOCALIZATION CLIP
4 series · 4 of 12 positions shown · non-contrast
Comparison: Previous exam(s).

CLINICAL DATA: Status post MRI guided biopsy earlier today for
suspicious non-mass enhancement at the anterior margin of the
lumpectomy bed.

EXAM:
DIAGNOSTIC RIGHT MAMMOGRAM POST MRI BIOPSY

[R ML synth-2D]
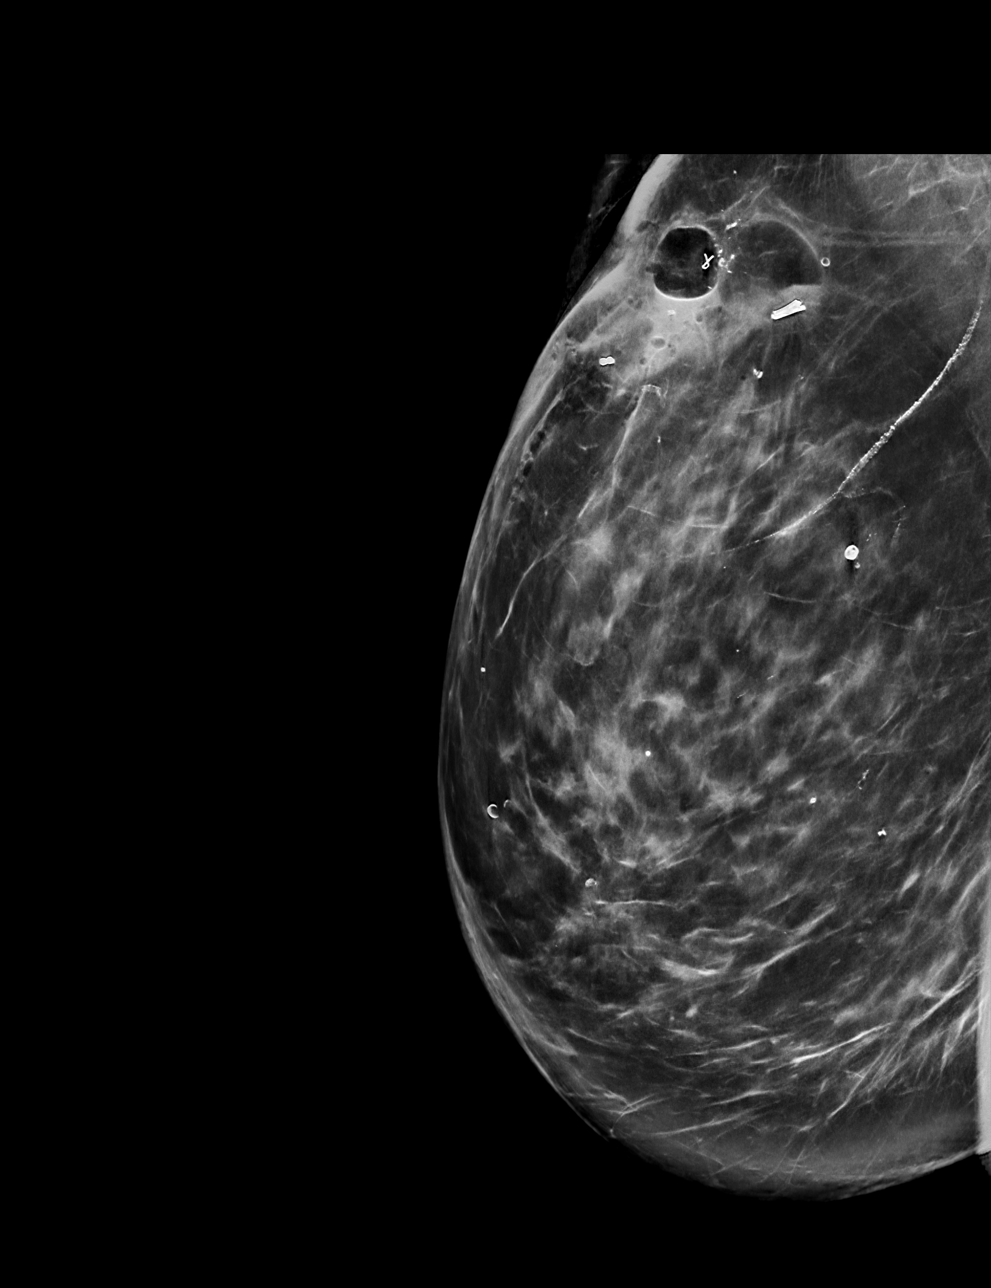

[R CC synth-2D]
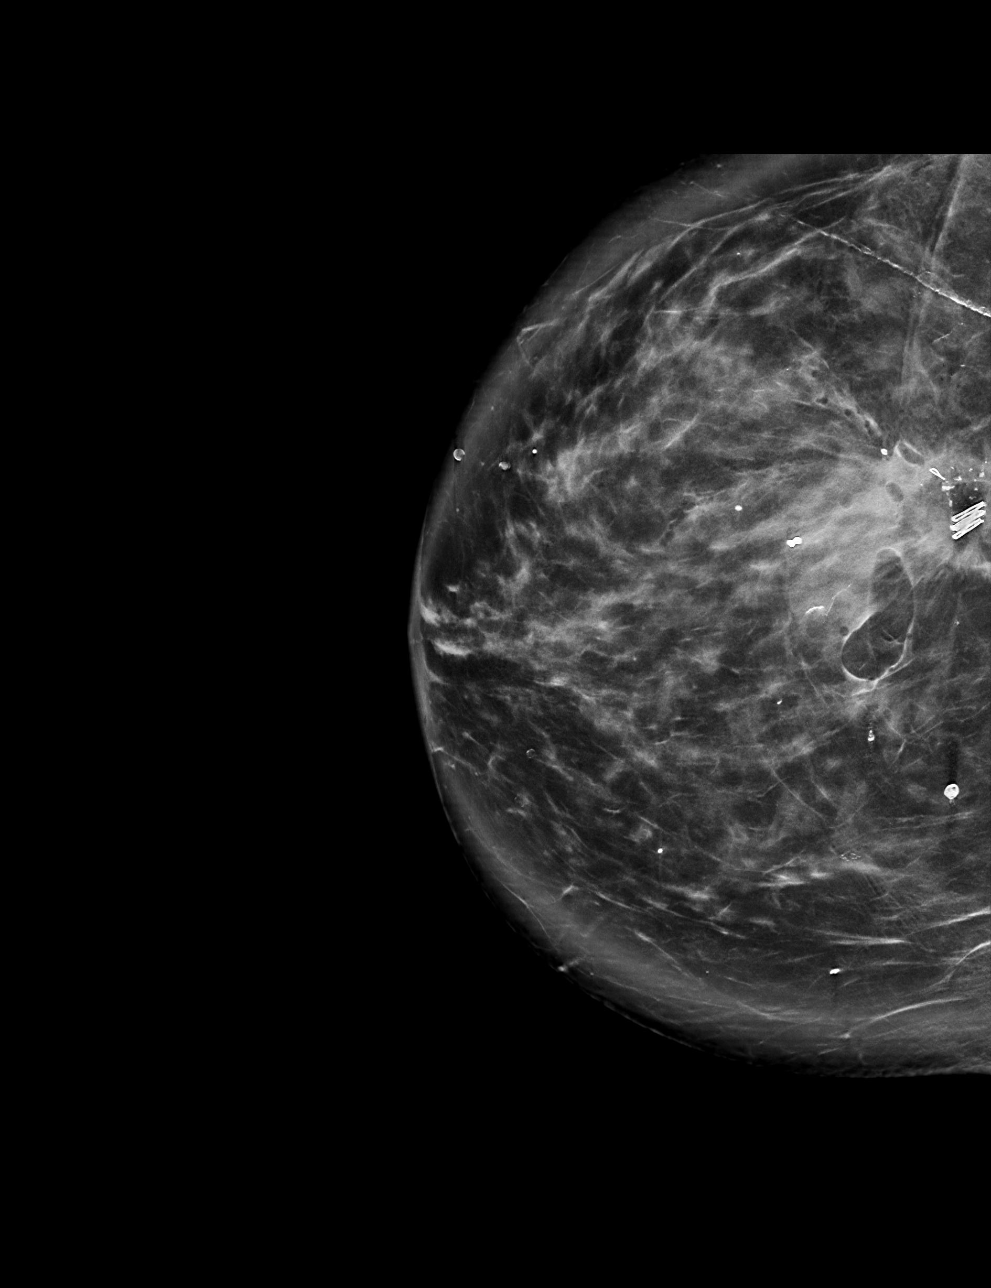

[R CC tomo · tomo slice 47/93.0]
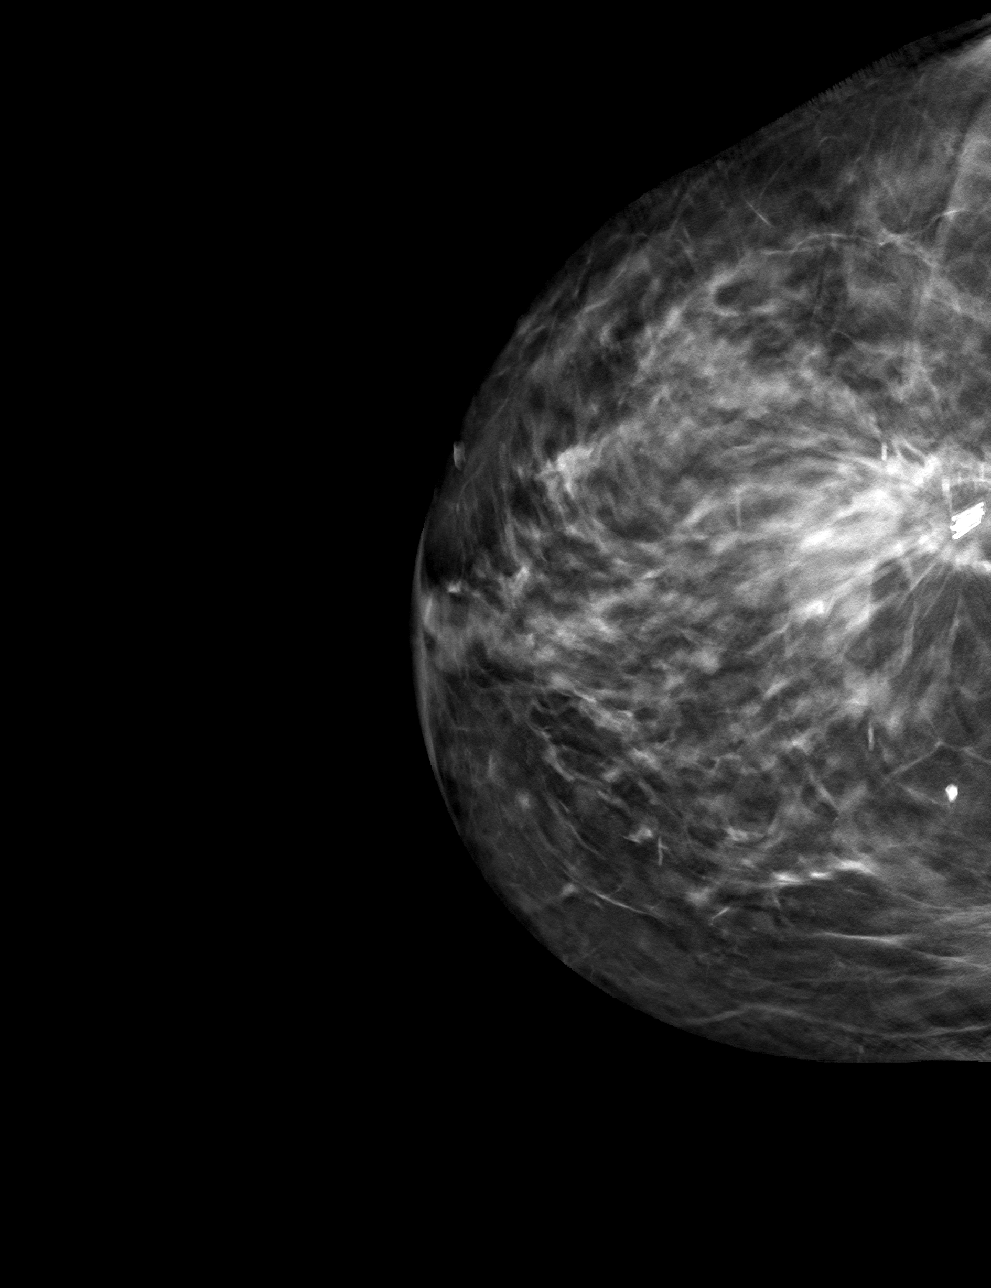

[R ML tomo · tomo slice 47/94.0]
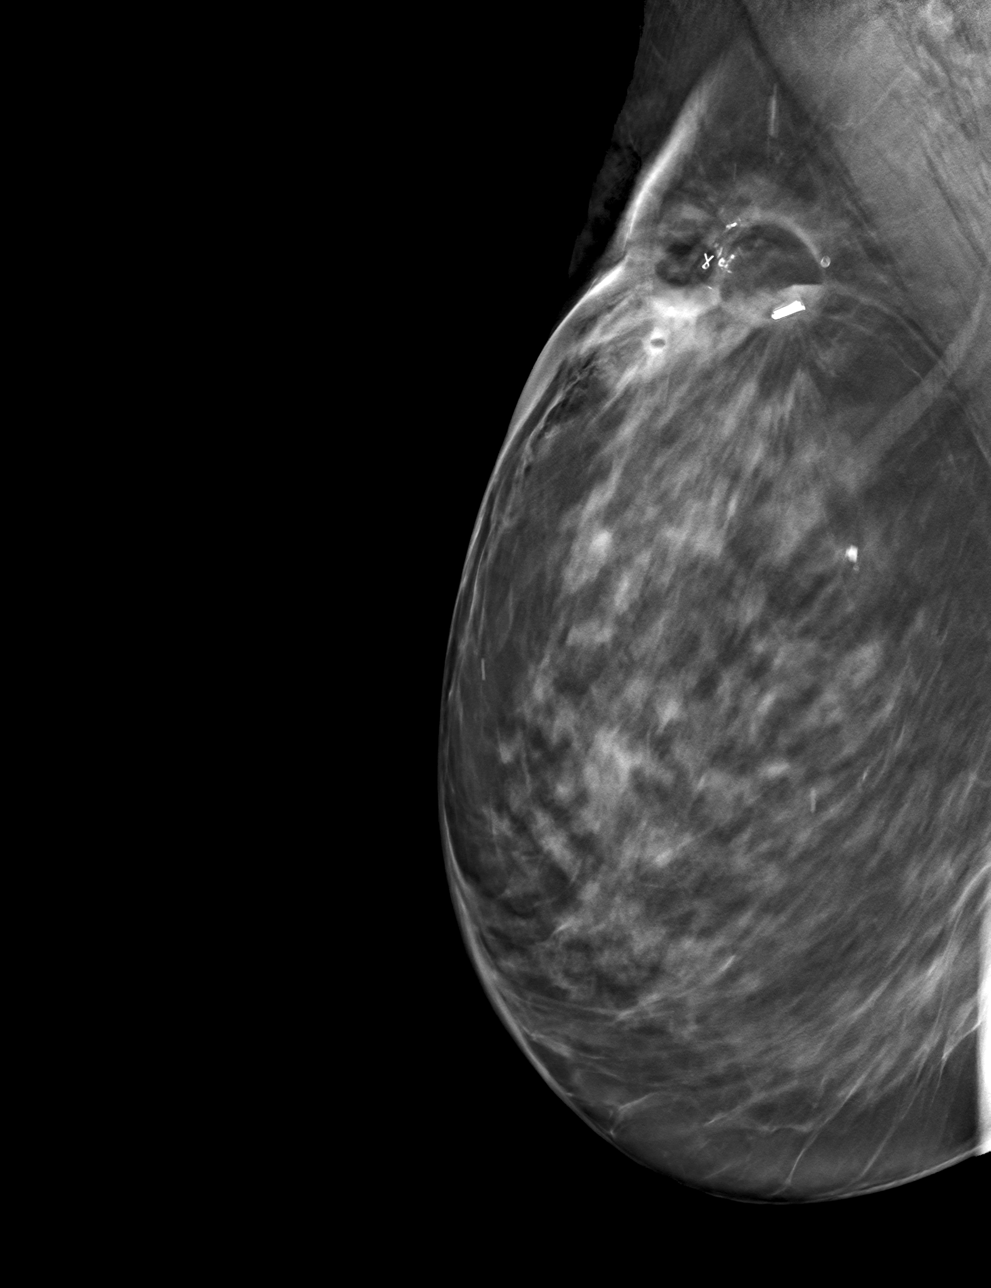

[4 of 12 positions shown; findings below may reference images not displayed]

FINDINGS: Mammographic images were obtained following MRI guided biopsy of the
nodular non mass enhancement at the anterior margin of the
lumpectomy bed in the upper inner quadrant of the RIGHT breast. The
biopsy marking clip is in expected position at the site of biopsy.
IMPRESSION: Appropriate positioning of the barbell shaped biopsy marking clip at
the site of biopsy in the upper inner quadrant of the RIGHT breast
at the anterior margin of the lumpectomy bed.

Final Assessment: Post Procedure Mammograms for Marker Placement

## 2020-07-14 MED ORDER — GADOBUTROL 1 MMOL/ML IV SOLN
6.0000 mL | Freq: Once | INTRAVENOUS | Status: AC | PRN
  Administered 2020-07-14: 6 mL via INTRAVENOUS

## 2020-07-20 DIAGNOSIS — N3281 Overactive bladder: Secondary | ICD-10-CM | POA: Diagnosis not present

## 2020-07-20 DIAGNOSIS — N3020 Other chronic cystitis without hematuria: Secondary | ICD-10-CM | POA: Diagnosis not present

## 2020-07-30 DIAGNOSIS — E538 Deficiency of other specified B group vitamins: Secondary | ICD-10-CM | POA: Diagnosis not present

## 2020-08-06 DIAGNOSIS — R350 Frequency of micturition: Secondary | ICD-10-CM | POA: Diagnosis not present

## 2020-08-31 DIAGNOSIS — E538 Deficiency of other specified B group vitamins: Secondary | ICD-10-CM | POA: Diagnosis not present

## 2020-09-01 DIAGNOSIS — R350 Frequency of micturition: Secondary | ICD-10-CM | POA: Diagnosis not present

## 2020-09-08 ENCOUNTER — Other Ambulatory Visit: Payer: Self-pay | Admitting: Oncology

## 2020-09-15 DIAGNOSIS — E538 Deficiency of other specified B group vitamins: Secondary | ICD-10-CM | POA: Diagnosis not present

## 2020-09-29 DIAGNOSIS — R350 Frequency of micturition: Secondary | ICD-10-CM | POA: Diagnosis not present

## 2020-09-29 DIAGNOSIS — R3915 Urgency of urination: Secondary | ICD-10-CM | POA: Diagnosis not present

## 2020-09-29 DIAGNOSIS — E538 Deficiency of other specified B group vitamins: Secondary | ICD-10-CM | POA: Diagnosis not present

## 2020-10-27 DIAGNOSIS — E538 Deficiency of other specified B group vitamins: Secondary | ICD-10-CM | POA: Diagnosis not present

## 2020-11-03 DIAGNOSIS — R350 Frequency of micturition: Secondary | ICD-10-CM | POA: Diagnosis not present

## 2020-12-01 DIAGNOSIS — E538 Deficiency of other specified B group vitamins: Secondary | ICD-10-CM | POA: Diagnosis not present

## 2020-12-01 DIAGNOSIS — I10 Essential (primary) hypertension: Secondary | ICD-10-CM | POA: Diagnosis not present

## 2020-12-01 DIAGNOSIS — E1169 Type 2 diabetes mellitus with other specified complication: Secondary | ICD-10-CM | POA: Diagnosis not present

## 2020-12-01 DIAGNOSIS — N319 Neuromuscular dysfunction of bladder, unspecified: Secondary | ICD-10-CM | POA: Diagnosis not present

## 2020-12-01 DIAGNOSIS — C50911 Malignant neoplasm of unspecified site of right female breast: Secondary | ICD-10-CM | POA: Diagnosis not present

## 2020-12-01 DIAGNOSIS — R350 Frequency of micturition: Secondary | ICD-10-CM | POA: Diagnosis not present

## 2020-12-01 DIAGNOSIS — E7800 Pure hypercholesterolemia, unspecified: Secondary | ICD-10-CM | POA: Diagnosis not present

## 2020-12-12 DIAGNOSIS — Z20822 Contact with and (suspected) exposure to covid-19: Secondary | ICD-10-CM | POA: Diagnosis not present

## 2020-12-15 DIAGNOSIS — E538 Deficiency of other specified B group vitamins: Secondary | ICD-10-CM | POA: Diagnosis not present

## 2020-12-29 DIAGNOSIS — R3915 Urgency of urination: Secondary | ICD-10-CM | POA: Diagnosis not present

## 2020-12-29 DIAGNOSIS — R350 Frequency of micturition: Secondary | ICD-10-CM | POA: Diagnosis not present

## 2020-12-29 DIAGNOSIS — E538 Deficiency of other specified B group vitamins: Secondary | ICD-10-CM | POA: Diagnosis not present

## 2021-01-04 DIAGNOSIS — N3281 Overactive bladder: Secondary | ICD-10-CM | POA: Diagnosis not present

## 2021-01-11 DIAGNOSIS — C50911 Malignant neoplasm of unspecified site of right female breast: Secondary | ICD-10-CM | POA: Diagnosis not present

## 2021-01-12 DIAGNOSIS — E538 Deficiency of other specified B group vitamins: Secondary | ICD-10-CM | POA: Diagnosis not present

## 2021-02-01 DIAGNOSIS — E538 Deficiency of other specified B group vitamins: Secondary | ICD-10-CM | POA: Diagnosis not present

## 2021-02-09 ENCOUNTER — Other Ambulatory Visit: Payer: Self-pay | Admitting: Family Medicine

## 2021-02-09 DIAGNOSIS — Z1231 Encounter for screening mammogram for malignant neoplasm of breast: Secondary | ICD-10-CM

## 2021-02-15 DIAGNOSIS — E538 Deficiency of other specified B group vitamins: Secondary | ICD-10-CM | POA: Diagnosis not present

## 2021-02-16 DIAGNOSIS — N3020 Other chronic cystitis without hematuria: Secondary | ICD-10-CM | POA: Diagnosis not present

## 2021-02-16 DIAGNOSIS — N3281 Overactive bladder: Secondary | ICD-10-CM | POA: Diagnosis not present

## 2021-02-16 DIAGNOSIS — R350 Frequency of micturition: Secondary | ICD-10-CM | POA: Diagnosis not present

## 2021-03-02 ENCOUNTER — Other Ambulatory Visit: Payer: Self-pay | Admitting: Oncology

## 2021-03-22 DIAGNOSIS — E538 Deficiency of other specified B group vitamins: Secondary | ICD-10-CM | POA: Diagnosis not present

## 2021-03-23 DIAGNOSIS — M8588 Other specified disorders of bone density and structure, other site: Secondary | ICD-10-CM | POA: Diagnosis not present

## 2021-03-23 DIAGNOSIS — N958 Other specified menopausal and perimenopausal disorders: Secondary | ICD-10-CM | POA: Diagnosis not present

## 2021-03-30 DIAGNOSIS — R350 Frequency of micturition: Secondary | ICD-10-CM | POA: Diagnosis not present

## 2021-03-30 DIAGNOSIS — N3020 Other chronic cystitis without hematuria: Secondary | ICD-10-CM | POA: Diagnosis not present

## 2021-03-31 ENCOUNTER — Other Ambulatory Visit: Payer: Self-pay | Admitting: Oncology

## 2021-03-31 DIAGNOSIS — Z1231 Encounter for screening mammogram for malignant neoplasm of breast: Secondary | ICD-10-CM

## 2021-03-31 DIAGNOSIS — Z853 Personal history of malignant neoplasm of breast: Secondary | ICD-10-CM

## 2021-04-05 ENCOUNTER — Other Ambulatory Visit: Payer: Self-pay

## 2021-04-05 ENCOUNTER — Ambulatory Visit
Admission: RE | Admit: 2021-04-05 | Discharge: 2021-04-05 | Disposition: A | Discharge status: Home / Self Care | Payer: Medicare Other | Source: Ambulatory Visit | Attending: Family Medicine | Admitting: Family Medicine

## 2021-04-05 DIAGNOSIS — Z853 Personal history of malignant neoplasm of breast: Secondary | ICD-10-CM

## 2021-04-05 DIAGNOSIS — R922 Inconclusive mammogram: Secondary | ICD-10-CM | POA: Diagnosis not present

## 2021-04-05 IMAGING — MG DIGITAL DIAGNOSTIC BILAT W/ TOMO W/ CAD
6 of 10 series · 6 of 26 positions shown · non-contrast
Comparison: Previous exam(s).

CLINICAL DATA: Follow-up right breast probably benign
calcifications felt to most likely represent early changes of fat
necrosis at the time diagnostic mammography on [DATE]. The
patient had a subsequent bilateral breast MRI dated [DATE]
followed by a right breast MR guided core needle biopsy dated
[DATE] demonstrating fat necrosis and fibrosis. The patient also
had ultrasound-guided core needle biopsy of the right breast on
[DATE] demonstrating fat necrosis. Status post right lumpectomy
and radiation therapy for breast cancer in [GY].

EXAM:
DIGITAL DIAGNOSTIC BILATERAL MAMMOGRAM WITH TOMOSYNTHESIS AND CAD
TECHNIQUE: Bilateral digital diagnostic mammography and breast tomosynthesis
was performed. The images were evaluated with computer-aided
detection.

[R CC]
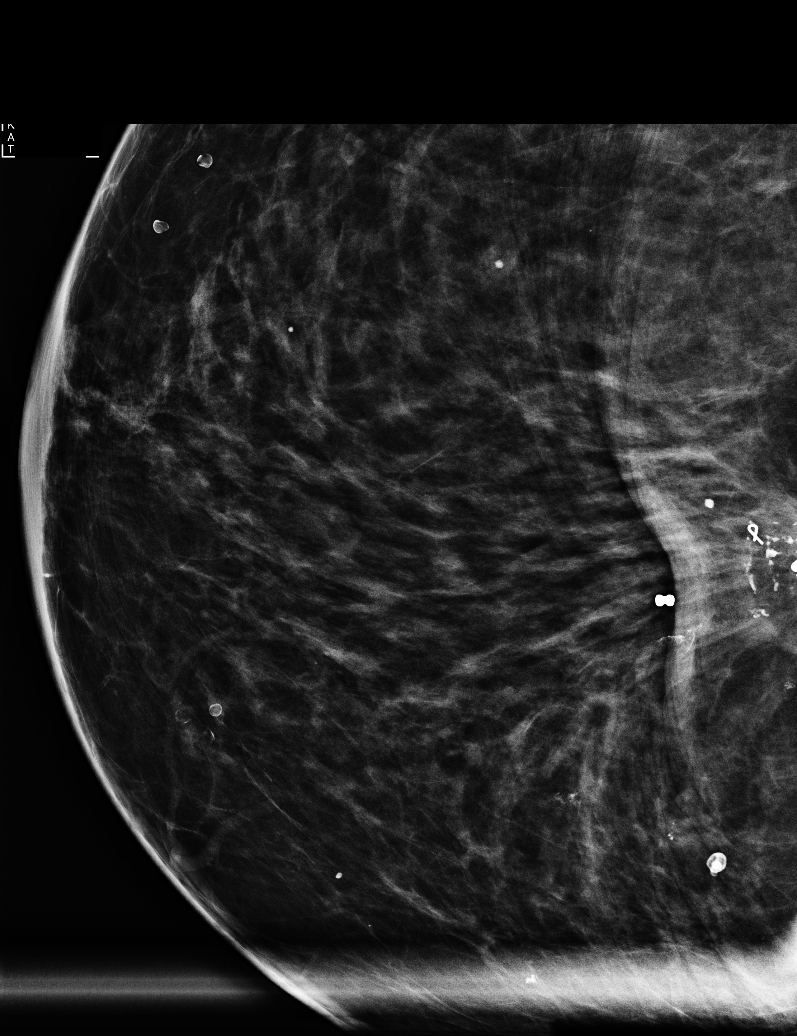

[R ML]
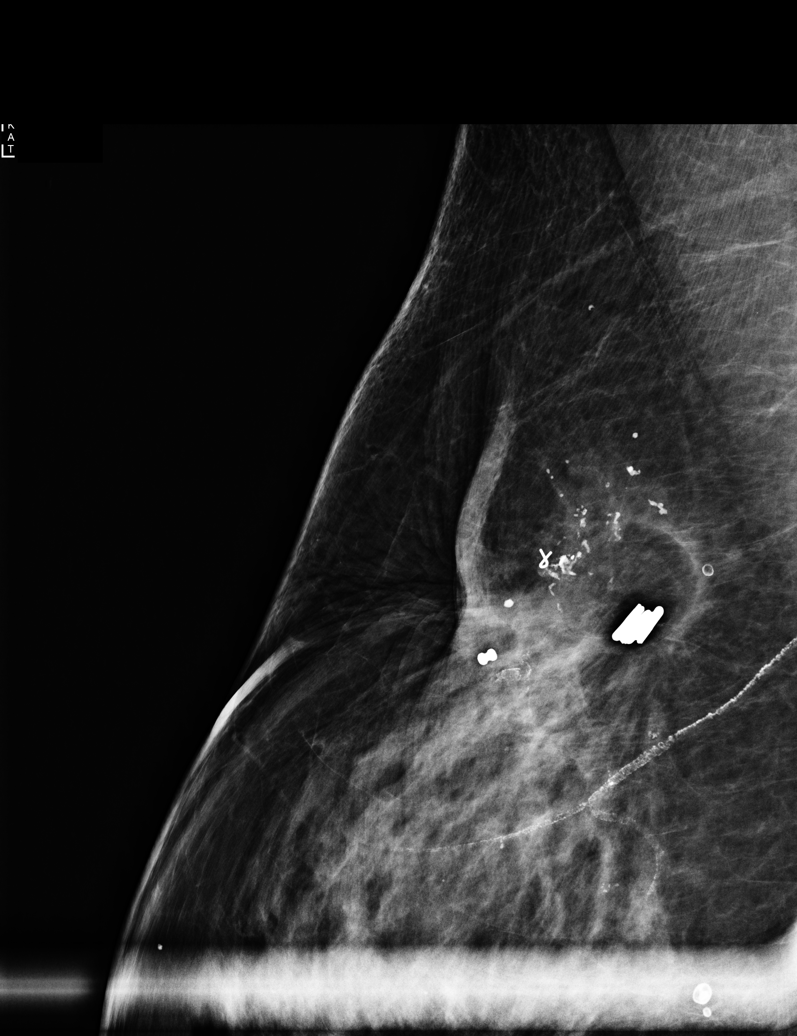

[R MLO synth-2D]
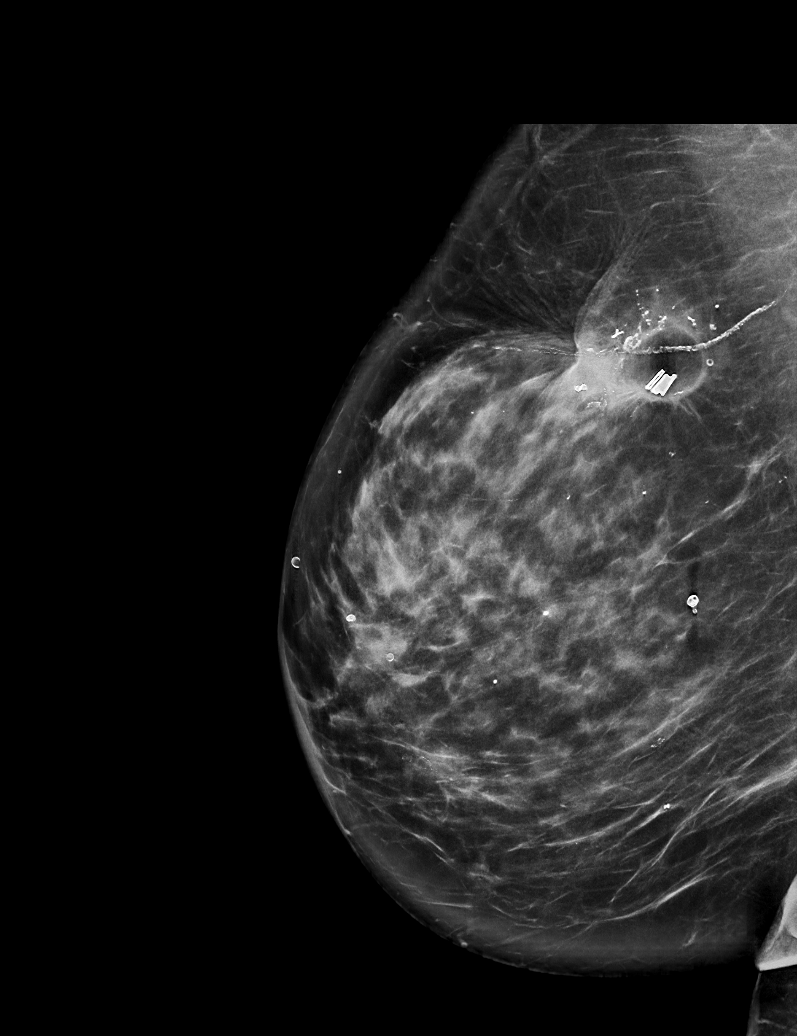

[L MLO synth-2D]
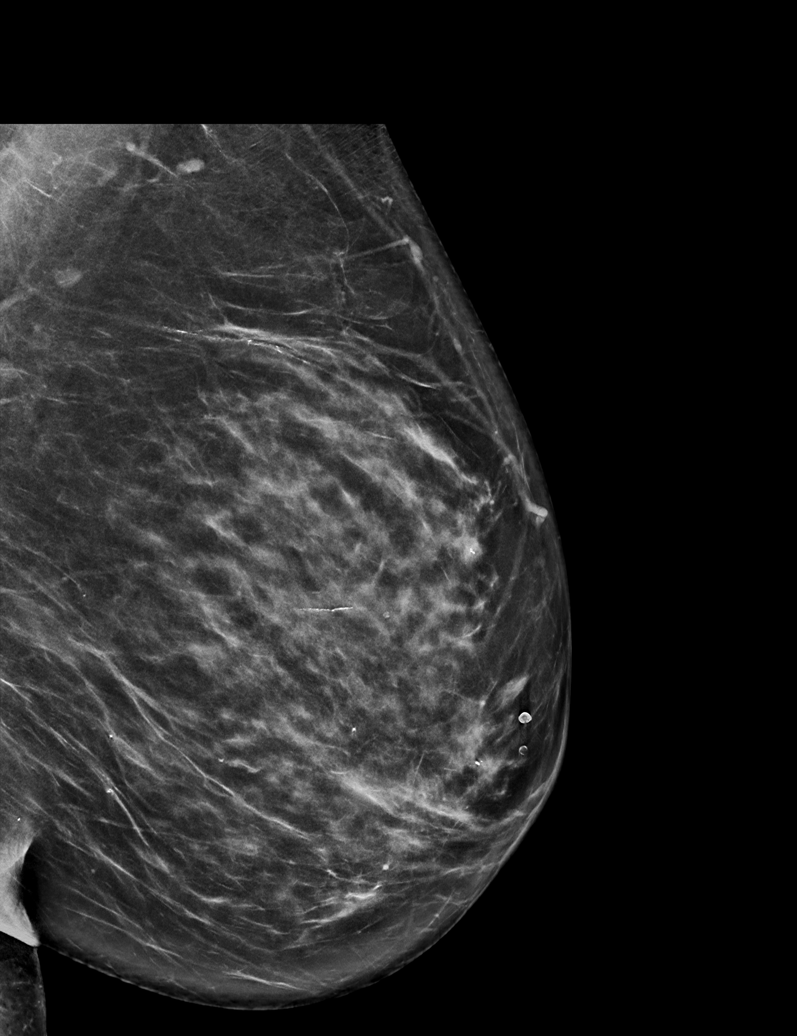

[R CC synth-2D]
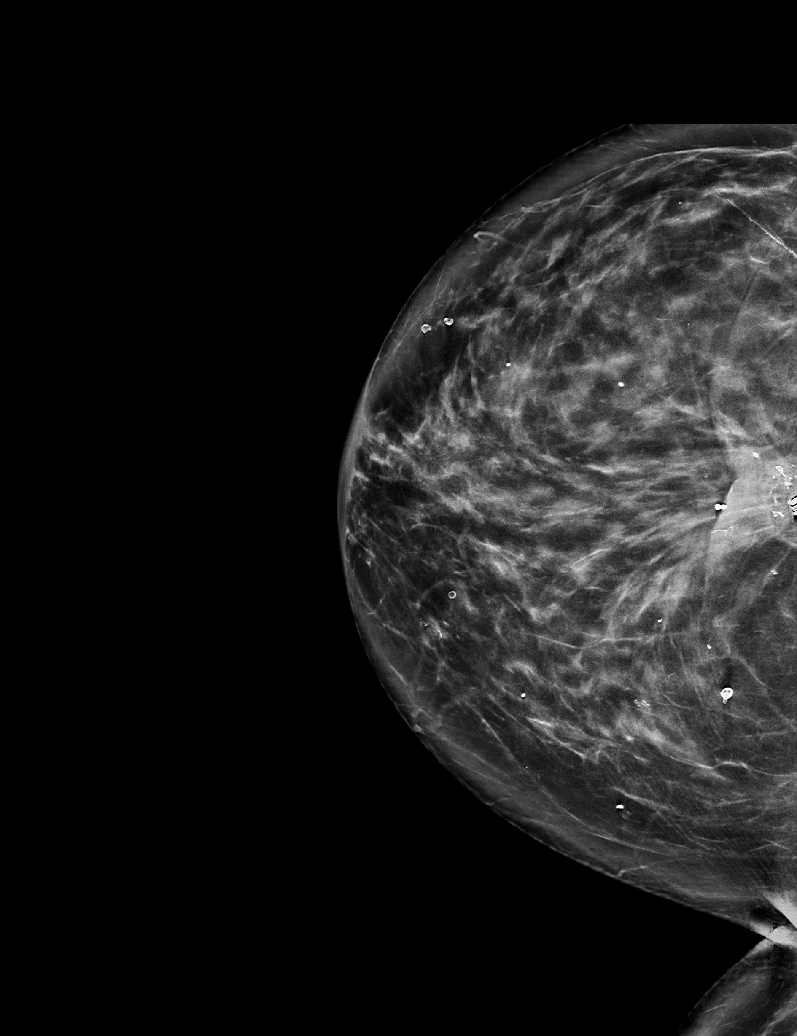

[L CC synth-2D]
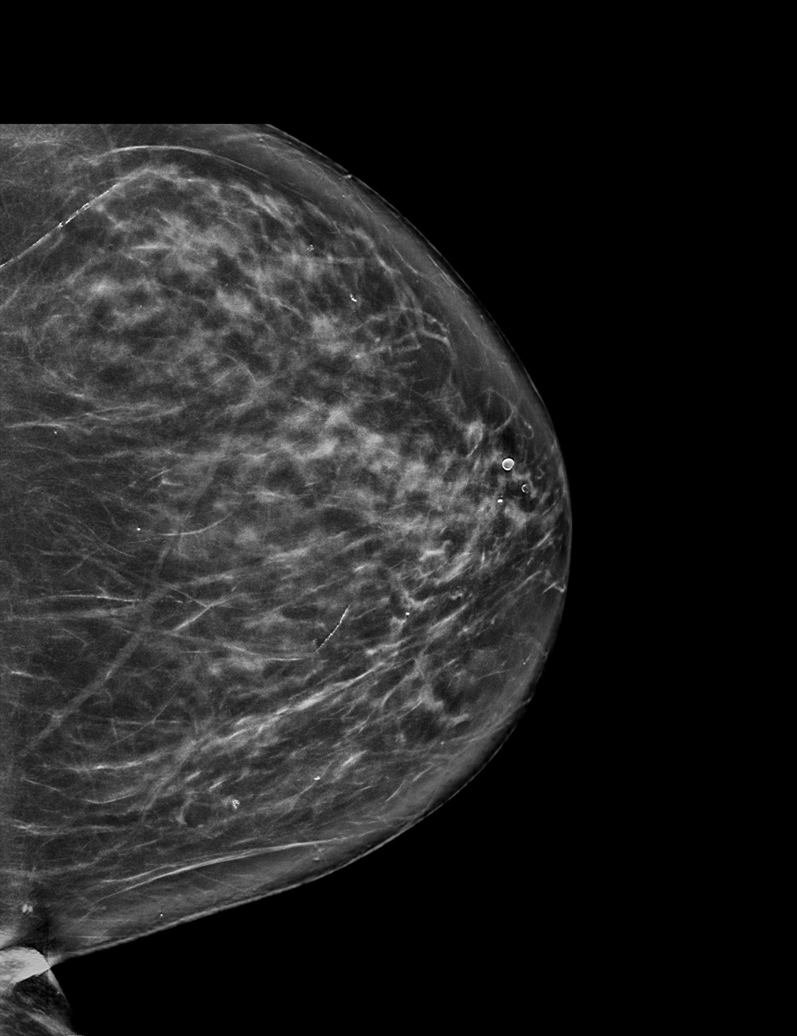

[6 of 26 positions shown; findings below may reference images not displayed]

ACR Breast Density Category c: The breast tissue is heterogeneously
dense, which may obscure small masses.
FINDINGS: Progressive coarse calcifications surrounding an oil cyst in the
lumpectomy bed in upper right breast posteriorly. These include
calcifications at the location of the previous ultrasound-guided
core needle biopsy showing fat necrosis, marked with a ribbon shaped
biopsy marker clip. Today, these calcifications have a more
typically benign appearance compatible with fat necrosis. No
interval findings suspicious for malignancy in either breast.
IMPRESSION: 1. The previously demonstrated probably benign calcifications at the
lumpectomy bed on the right have typically benign features today,
compatible with calcifications associated with fat necrosis.
2. No evidence of malignancy in either breast.

RECOMMENDATION:
Per protocol, as the patient is now 2 or more years status post
lumpectomy, she may return to annual screening mammography in 1
year. However, given the history of breast cancer, the patient
remains eligible for annual diagnostic mammography if preferred.

I have discussed the findings and recommendations with the patient.
If applicable, a reminder letter will be sent to the patient
regarding the next appointment.

BI-RADS CATEGORY  2: Benign.

## 2021-04-08 ENCOUNTER — Inpatient Hospital Stay: Payer: Medicare Other

## 2021-04-08 ENCOUNTER — Inpatient Hospital Stay: Payer: Medicare Other | Attending: Oncology | Admitting: Oncology

## 2021-04-08 ENCOUNTER — Other Ambulatory Visit: Payer: Self-pay

## 2021-04-08 VITALS — BP 148/71 | HR 100 | Temp 98.1°F | Resp 18 | Ht 61.0 in | Wt 124.8 lb

## 2021-04-08 DIAGNOSIS — Z79899 Other long term (current) drug therapy: Secondary | ICD-10-CM | POA: Diagnosis not present

## 2021-04-08 DIAGNOSIS — M8589 Other specified disorders of bone density and structure, multiple sites: Secondary | ICD-10-CM | POA: Insufficient documentation

## 2021-04-08 DIAGNOSIS — C50411 Malignant neoplasm of upper-outer quadrant of right female breast: Secondary | ICD-10-CM | POA: Diagnosis not present

## 2021-04-08 DIAGNOSIS — Z170 Estrogen receptor positive status [ER+]: Secondary | ICD-10-CM | POA: Diagnosis not present

## 2021-04-08 DIAGNOSIS — E538 Deficiency of other specified B group vitamins: Secondary | ICD-10-CM

## 2021-04-08 DIAGNOSIS — Z79811 Long term (current) use of aromatase inhibitors: Secondary | ICD-10-CM | POA: Diagnosis not present

## 2021-04-08 DIAGNOSIS — C50911 Malignant neoplasm of unspecified site of right female breast: Secondary | ICD-10-CM | POA: Diagnosis not present

## 2021-04-08 DIAGNOSIS — D528 Other folate deficiency anemias: Secondary | ICD-10-CM

## 2021-04-08 LAB — FOLATE: Folate: 16.5 ng/mL (ref >5.9)

## 2021-04-08 LAB — VITAMIN B12: Vitamin B-12: 262 pg/mL (ref 180–914)

## 2021-04-08 LAB — CBC WITH DIFFERENTIAL/PLATELET
Abs Immature Granulocytes: 0.02 10*3/uL (ref 0.00–0.07)
Basophils Absolute: 0.1 10*3/uL (ref 0.0–0.1)
Basophils Relative: 1 %
Eosinophils Absolute: 0.3 10*3/uL (ref 0.0–0.5)
Eosinophils Relative: 4 %
HCT: 42.9 % (ref 36.0–46.0)
Hemoglobin: 14.3 g/dL (ref 12.0–15.0)
Immature Granulocytes: 0 %
Lymphocytes Relative: 27 %
Lymphs Abs: 2.3 10*3/uL (ref 0.7–4.0)
MCH: 29.9 pg (ref 26.0–34.0)
MCHC: 33.3 g/dL (ref 30.0–36.0)
MCV: 89.7 fL (ref 80.0–100.0)
Monocytes Absolute: 0.5 10*3/uL (ref 0.1–1.0)
Monocytes Relative: 6 %
Neutro Abs: 5.4 10*3/uL (ref 1.7–7.7)
Neutrophils Relative %: 62 %
Platelets: 266 10*3/uL (ref 150–400)
RBC: 4.78 MIL/uL (ref 3.87–5.11)
RDW: 12.9 % (ref 11.5–15.5)
WBC: 8.5 10*3/uL (ref 4.0–10.5)
nRBC: 0 % (ref 0.0–0.2)

## 2021-04-08 LAB — FERRITIN: Ferritin: 54 ng/mL (ref 11–307)

## 2021-04-08 NOTE — Progress Notes (Signed)
[place]  [phone] [phone]     ID: Meghan Padilla DOB: 05-08-1951  MR#: [id number]  SWN#:[id number]  Patient Care Team: [name], MD as PCP - General (Family Medicine) Elgin Carn, [name], MD as Consulting Physician (Oncology) Erroll Luna, MD as Consulting Physician (General Surgery) Gery Pray, MD as Consulting Physician (Radiation Oncology) Arvella Nigh, MD (Obstetrics and Gynecology) Franchot Gallo, MD as Consulting Physician (Urology) Wilford Corner, MD as Consulting Physician (Gastroenterology) Delice Bison, [name], NP as Nurse Practitioner (Hematology and Oncology) OTHER MD:  CHIEF COMPLAINT: Estrogen receptor positive breast cancer  CURRENT TREATMENT: Completing 5 years anastrozole   INTERVAL HISTORY: Meghan Padilla returns today for follow-up of her estrogen receptor positive breast cancer.  She continues on anastrozole.  She tolerated it quite well.  She took her last pill earlier this week  Her most recent bone density screening on 02/20/2018 at [facility] showed a T-score of -1.6, which is considered osteopenic.  She has a repeat due in October of this year.  Since her last visit, she underwent breast MRI on 06/23/2020 showing: breast composition B; increased peripheral enhancement along superior and anterior right breast lumpectomy site, measuring 2.9 cm; no evidence of malignancy in left breast.  She proceeded to biopsy of the right breast area in question on 07/14/2020. Pathology [phone]) showed fat necrosis and fibrosis.  She also underwent bilateral diagnostic mammography with tomography at [facility] on 04/05/2021 showing: breast density category C; no evidence of malignancy in either breast.    REVIEW OF SYSTEMS: Meghan Padilla generally feels fine.  She keeps her 63-monthgranddaughter 3 days a week and that keeps her busy.  She does all the household chores, shopping, etc.  She does not  otherwise exercise regularly.  She and her husband both had COVID May 2022.  She had fever, deep cough, sore throat, and aches and pains but did not require medication.  A detailed review of systems was otherwise benign   COVID 19 VACCINATION STATUS: [facility] Bow[facility]; also had COVID May 2022.    BREAST CANCER HISTORY: From the original intake note:  Meghan Padilla [facility] showing a possible right breast distortion and or mass and possibly a right axillary lymph node. She was recalled [facility] diagnostic [facility]. This found her right breast density to be category C. In the upper right breast there was a site of distortion. The mass of concern was consistent with an intramamm [name]. On exam there was no palpable abnormality. Ultrasonography found a hypoechoic irregular mass with spiculated margins at the 12:00 position of the right breast measuring 0.5 cm. Ultrasound of the right axilla showed multiple normal-appearing lymph nodes.  Biopsy of the right breast mass in question 02/29/2016 showed orifices [id number] and invasive ductal carcinoma, grade 2, estrogen receptor 95% positive, with strong staining intensity, progesterone receptor 2% positive, with strong staining intensity, with an MIB-1 of 10%, and no HER-2 amplification, the signals ratio being 1.48 and the number per cell 2.30.  Her subsequent history is as detailed below.   PAST MEDICAL HISTORY: Past Medical History:  Diagnosis Date   Breast cancer [place]    [place] [place]   [place] breast   Diabetes mellitus    GERD (gastroesophageal reflux disease)    History of radiation therapy 05/04/16-06/01/16   right breast 42.72 Gy boost to 10 Gy   Hyperlipidemia  Hypertension    Personal history of radiation therapy 05/05/2016   ended radiaiton 06-01-2016   UTI (urinary tract infection)     PAST  SURGICAL HISTORY: Past Surgical History:  Procedure Laterality Date   BREAST BIOPSY Right 02/29/2016   BREAST BIOPSY Right 11/12/2019   BREAST BIOPSY Right 07/14/2020   BREAST LUMPECTOMY Right 03/24/2016   right 03/2016   DILATION AND CURETTAGE OF UTERUS     HAND SURGERY     HAND SURGERY     HYSTEROSCOPY WITH D & C  11/28/2008   RADIOACTIVE SEED GUIDED PARTIAL MASTECTOMY WITH AXILLARY SENTINEL LYMPH NODE BIOPSY Right 03/24/2016   Procedure: RADIOACTIVE SEED GUIDED PARTIAL MASTECTOMY WITH AXILLARY SENTINEL LYMPH NODE BIOPSY;  Surgeon: Erroll Luna, MD;  Location: [facility];  Service: General;  Laterality: Right;   TONSILLECTOMY      FAMILY HISTORY Family History  Problem Relation Age of Onset   Cancer Neg Hx   The patient's parents are still living, in their late 25s. The patient had no brothers, 2 sisters. There is no history of breast or ovarian or any other cancer in the family to the patient's knowledge    GYNECOLOGIC HISTORY:  No LMP recorded. Patient is postmenopausal.  menarche age 43, first live birth age 21. The patient went through menopause in her early 10s. She did not take hormone replacement. 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 Her husband Meghan Padilla was a Dealer at [employer]. He is retired and disabled after multiple back surgeries. Daughter Meghan Padilla lives in [place] and is disabled secondary to cognitive and behavioral issues. Son Meghan Padilla lives in [place] where he does car painting and body work.  He recently married into the [place] family and the patient now has a step 14 year old granddaughter that she is helping during the virtual school months.  She is a Tourist information centre manager.    ADVANCED DIRECTIVES: Not in place    HEALTH MAINTENANCE: Social History   Tobacco Use   Smoking status: Never    Smokeless tobacco: Never  Substance Use Topics   Alcohol use: No   Drug use: No     Colonoscopy: Dr. Michail Sermon  PAP:  Bone density:   Allergies  Allergen Reactions   Sulfa Antibiotics Nausea Only    Current Outpatient Medications  Medication Sig Dispense Refill   anastrozole (ARIMIDEX) 1 MG tablet TAKE 1 TABLET BY MOUTH EVERY DAY 90 tablet 1   atorvastatin (LIPITOR) 40 MG tablet Take 40 mg by mouth daily.     metFORMIN (GLUCOPHAGE) 500 MG tablet Take 4 tablets (2,000 mg total) by mouth at bedtime.     omeprazole (PRILOSEC) 20 MG capsule Take 20 mg by mouth daily.     Probiotic Product (PROBIOTIC DAILY) CAPS Take by mouth.     quinapril (ACCUPRIL) 20 MG tablet Take 20 mg by mouth at bedtime.     tolterodine (DETROL LA) 4 MG 24 hr capsule TAKE 1 CAPSULE BY MOUTH EVERY DAY 90 capsule 3   No current facility-administered medications for this visit.    OBJECTIVE: White woman in no acute distress  Vitals:   04/08/21 1055  BP: (!) 148/71  Pulse: 100  Resp: 18  Temp: 98.1 F (36.7 C)  SpO2: 97%    Wt Readings from Last 3 Encounters:  04/08/21 124 lb 12.8 oz (56.6 kg)  06/08/20 128 lb 6.4 oz (58.2 kg)  10/29/19 131 lb 4.8 oz (59.6 kg)   Body mass index is 23.58 kg/m.    ECOG FS:1 - Symptomatic but completely ambulatory  Sclerae unicteric, EOMs intact Wearing a mask No cervical or supraclavicular adenopathy Lungs no rales or rhonchi Heart regular rate and rhythm Abd soft, nontender, positive bowel sounds MSK no focal spinal tenderness, no upper extremity lymphedema Neuro: nonfocal, well oriented, appropriate affect Breasts: The right breast is status postlumpectomy and radiation.  There is no evidence of local recurrence.  The left breast is benign.  Both axillae are benign.   LAB RESULTS:  CMP     Component Value Date/Time   NA 142 06/06/2019 1112   NA 142 04/10/2017 1329   K 3.7 06/06/2019 1112   K 3.5 04/10/2017 1329   CL 106 06/06/2019 1112   CO2 24  06/06/2019 1112   CO2 31 (H) 04/10/2017 1329   GLUCOSE 96 06/06/2019 1112   GLUCOSE 174 (H) 04/10/2017 1329   BUN 11 06/06/2019 1112   BUN 7.3 04/10/2017 1329   CREATININE 0.72 06/06/2019 1112   CREATININE 0.8 04/10/2017 1329   CALCIUM 9.1 06/06/2019 1112   CALCIUM 9.4 04/10/2017 1329   PROT 6.8 06/06/2019 1112   PROT 6.4 04/10/2017 1329   ALBUMIN 4.0 06/06/2019 1112   ALBUMIN 3.6 04/10/2017 1329   AST 19 06/06/2019 1112   AST 22 04/10/2017 1329   ALT 17 06/06/2019 1112   ALT 20 04/10/2017 1329   ALKPHOS 83 06/06/2019 1112   ALKPHOS 78 04/10/2017 1329   BILITOT 0.3 06/06/2019 1112   BILITOT 0.25 04/10/2017 1329   GFRNONAA >60 06/06/2019 1112   GFRAA >60 06/06/2019 1112    INo results found for: SPEP, UPEP  Lab Results  Component Value Date   WBC 8.5 04/08/2021   NEUTROABS 5.4 04/08/2021   HGB 14.3 04/08/2021   HCT 42.9 04/08/2021   MCV 89.7 04/08/2021   PLT 266 04/08/2021      Chemistry      Component Value Date/Time   NA 142 06/06/2019 1112   NA 142 04/10/2017 1329   K 3.7 06/06/2019 1112   K 3.5 04/10/2017 1329   CL 106 06/06/2019 1112   CO2 24 06/06/2019 1112   CO2 31 (H) 04/10/2017 1329   BUN 11 06/06/2019 1112   BUN 7.3 04/10/2017 1329   CREATININE 0.72 06/06/2019 1112   CREATININE 0.8 04/10/2017 1329      Component Value Date/Time   CALCIUM 9.1 06/06/2019 1112   CALCIUM 9.4 04/10/2017 1329   ALKPHOS 83 06/06/2019 1112   ALKPHOS 78 04/10/2017 1329   AST 19 06/06/2019 1112   AST 22 04/10/2017 1329   ALT 17 06/06/2019 1112   ALT 20 04/10/2017 1329   BILITOT 0.3 06/06/2019 1112   BILITOT 0.25 04/10/2017 1329       No results found for: LABCA2  No components found for: LABCA125  No results for input(s): INR in the last 168 hours.  Urinalysis    Component Value Date/Time   COLORURINE YELLOW 01/28/2012 1952   APPEARANCEUR CLEAR 01/28/2012 1952   LABSPEC 1.006 01/28/2012 1952   PHURINE 7.0 01/28/2012 1952   GLUCOSEU NEGATIVE 01/28/2012  1952   HGBUR NEGATIVE 01/28/2012 1952   BILIRUBINUR NEGATIVE 01/28/2012 1952   KETONESUR NEGATIVE 01/28/2012 1952   PROTEINUR NEGATIVE 01/28/2012 1952   UROBILINOGEN 0.2 01/28/2012 1952   NITRITE NEGATIVE 01/28/2012 1952   LEUKOCYTESUR TRACE (A) 01/28/2012 1952  STUDIES: MM DIAG BREAST TOMO BILATERAL  Result Date: 04/05/2021 CLINICAL DATA:  Follow-up right breast probably benign calcifications felt to most likely represent early changes of fat necrosis at the time diagnostic mammography on 03/10/2020. The patient had a subsequent bilateral breast MRI dated 06/23/2020 followed by a right breast MR guided core needle biopsy dated 07/20/2020 demonstrating fat necrosis and fibrosis. The patient also had ultrasound-guided core needle biopsy of the right breast on 11/12/2019 demonstrating fat necrosis. Status post right lumpectomy and radiation therapy for breast cancer in 2017. EXAM: DIGITAL DIAGNOSTIC BILATERAL MAMMOGRAM WITH TOMOSYNTHESIS AND CAD TECHNIQUE: Bilateral digital diagnostic mammography and breast tomosynthesis was performed. The images were evaluated with computer-aided detection. COMPARISON:  Previous exam(s). ACR Breast Density Category c: The breast tissue is heterogeneously dense, which may obscure small masses. FINDINGS: Progressive coarse calcifications surrounding an oil cyst in the lumpectomy bed in upper right breast posteriorly. These include calcifications at the location of the previous ultrasound-guided core needle biopsy showing fat necrosis, marked with a ribbon shaped biopsy marker clip. Today, these calcifications have a more typically benign appearance compatible with fat necrosis. No interval findings suspicious for malignancy in either breast. IMPRESSION: 1. The previously demonstrated probably benign calcifications at the lumpectomy bed on the right have typically benign features today, compatible with calcifications associated with fat necrosis. 2. No evidence of  malignancy in either breast. RECOMMENDATION: Per protocol, as the patient is now 2 or more years status post lumpectomy, she may return to annual screening mammography in 1 year. However, given the history of breast cancer, the patient remains eligible for annual diagnostic mammography if preferred. I have discussed the findings and recommendations with the patient. If applicable, a reminder letter will be sent to the patient regarding the next appointment. BI-RADS CATEGORY  2: Benign. Electronically Signed   By: Claudie Revering M.D.   On: 04/05/2021 13:24    ELIGIBLE FOR AVAILABLE RESEARCH PROTOCOL: no  ASSESSMENT: 70 y.o. [place] woman status post right breast upper outer quadrant biopsy 02/29/2016 for a clinical T1a N0, stage IA invasive ductal carcinoma, grade 2, estrogen receptor positive, progesterone receptor positive at 2%, HER-2 nonamplified, with an MIB-1 of 10%  (1) right lumpectomy and sentinel lymph node sampling 03/24/2016 confirmed a pT1c pN0, stage IA invasive lobular carcinoma, grade 2, with negative margins; a total of 3 sentinel lymph nodes were removed.  (2) Oncotype DX score of 16 ("low risk" "), predicts a risk of outside the breast recurrence within 10 years of 10% if the patient's only systemic therapy is tamoxifen for 5 years. It also predicts no benefit from adjuvant chemotherapy.  (3) adjuvant radiation 05/04/16-06/01/16:  52.72 Gy to the right breast+boost  (4) started anastrozole 07/22/2016, completing five years August 2022  (a) bone density 02/20/2018 at Dr. Ophelia Charter 2019 showed a T score of -1.6 /stable).   PLAN: Meghan Padilla is now 5 years out from definitive surgery for her breast cancer with no evidence of disease recurrence.  This is very favorable.  She is completing 5 years of anastrozole.  She tolerated that well.  I suspect after she has been off the medication a few weeks she will feel even better than she feels now.  At this point I feel comfortable releasing  her to her primary care physician all she will need in terms of breast cancer follow in the future is her yearly screening mammography and a yearly physician breast exam.  I will be glad to see Meghan Padilla again at any point  in the future if and when the need arises but as of now are making no further routine appointments for her here.  Total encounter time 25 minutes.Sarajane Jews C. Jaydeen Odor, MD 04/08/21 11:08 AM Medical Oncology and Hematology [facility] [place], [place] [address] Tel. [phone]    Fax. [phone]   I, Wilburn Mylar, am acting as scribe for Dr. [name]. Cristal Qadir.  I, Lurline Del MD, have reviewed the above documentation for accuracy and completeness, and I agree with the above.   *Total Encounter Time as defined by the Centers for Medicare and Medicaid Services includes, in addition to the face-to-face time of a patient visit (documented in the note above) non-face-to-face time: obtaining and reviewing outside history, ordering and reviewing medications, tests or procedures, care coordination (communications with other health care professionals or caregivers) and documentation in the medical record.

## 2021-04-10 LAB — METHYLMALONIC ACID, SERUM: Methylmalonic Acid, Quantitative: 101 nmol/L (ref 0–378)

## 2021-04-19 DIAGNOSIS — E538 Deficiency of other specified B group vitamins: Secondary | ICD-10-CM | POA: Diagnosis not present

## 2021-05-18 DIAGNOSIS — E538 Deficiency of other specified B group vitamins: Secondary | ICD-10-CM | POA: Diagnosis not present

## 2021-05-20 ENCOUNTER — Other Ambulatory Visit: Payer: Self-pay

## 2021-06-01 DIAGNOSIS — E538 Deficiency of other specified B group vitamins: Secondary | ICD-10-CM | POA: Diagnosis not present

## 2021-06-01 DIAGNOSIS — Z23 Encounter for immunization: Secondary | ICD-10-CM | POA: Diagnosis not present

## 2021-06-15 DIAGNOSIS — E538 Deficiency of other specified B group vitamins: Secondary | ICD-10-CM | POA: Diagnosis not present

## 2021-06-29 DIAGNOSIS — R350 Frequency of micturition: Secondary | ICD-10-CM | POA: Diagnosis not present

## 2021-06-29 DIAGNOSIS — N3020 Other chronic cystitis without hematuria: Secondary | ICD-10-CM | POA: Diagnosis not present

## 2021-06-29 DIAGNOSIS — E538 Deficiency of other specified B group vitamins: Secondary | ICD-10-CM | POA: Diagnosis not present

## 2021-07-09 DIAGNOSIS — N3281 Overactive bladder: Secondary | ICD-10-CM | POA: Insufficient documentation

## 2021-07-10 ENCOUNTER — Other Ambulatory Visit: Payer: Self-pay | Admitting: Oncology

## 2021-07-12 DIAGNOSIS — Z6824 Body mass index (BMI) 24.0-24.9, adult: Secondary | ICD-10-CM | POA: Diagnosis not present

## 2021-07-12 DIAGNOSIS — Z01419 Encounter for gynecological examination (general) (routine) without abnormal findings: Secondary | ICD-10-CM | POA: Diagnosis not present

## 2021-07-13 DIAGNOSIS — Z853 Personal history of malignant neoplasm of breast: Secondary | ICD-10-CM | POA: Diagnosis not present

## 2021-07-13 DIAGNOSIS — Z23 Encounter for immunization: Secondary | ICD-10-CM | POA: Diagnosis not present

## 2021-07-13 DIAGNOSIS — Z1389 Encounter for screening for other disorder: Secondary | ICD-10-CM | POA: Diagnosis not present

## 2021-07-13 DIAGNOSIS — E538 Deficiency of other specified B group vitamins: Secondary | ICD-10-CM | POA: Diagnosis not present

## 2021-07-13 DIAGNOSIS — Z0000 Encounter for general adult medical examination without abnormal findings: Secondary | ICD-10-CM | POA: Diagnosis not present

## 2021-07-13 DIAGNOSIS — K219 Gastro-esophageal reflux disease without esophagitis: Secondary | ICD-10-CM | POA: Diagnosis not present

## 2021-07-13 DIAGNOSIS — R32 Unspecified urinary incontinence: Secondary | ICD-10-CM | POA: Diagnosis not present

## 2021-07-13 DIAGNOSIS — I10 Essential (primary) hypertension: Secondary | ICD-10-CM | POA: Diagnosis not present

## 2021-07-13 DIAGNOSIS — D509 Iron deficiency anemia, unspecified: Secondary | ICD-10-CM | POA: Diagnosis not present

## 2021-07-13 DIAGNOSIS — J309 Allergic rhinitis, unspecified: Secondary | ICD-10-CM | POA: Diagnosis not present

## 2021-07-13 DIAGNOSIS — E1169 Type 2 diabetes mellitus with other specified complication: Secondary | ICD-10-CM | POA: Diagnosis not present

## 2021-07-13 DIAGNOSIS — E7800 Pure hypercholesterolemia, unspecified: Secondary | ICD-10-CM | POA: Diagnosis not present

## 2021-08-10 DIAGNOSIS — E538 Deficiency of other specified B group vitamins: Secondary | ICD-10-CM | POA: Diagnosis not present

## 2021-09-07 DIAGNOSIS — R3915 Urgency of urination: Secondary | ICD-10-CM | POA: Diagnosis not present

## 2021-09-07 DIAGNOSIS — I10 Essential (primary) hypertension: Secondary | ICD-10-CM | POA: Diagnosis not present

## 2021-09-07 DIAGNOSIS — E1169 Type 2 diabetes mellitus with other specified complication: Secondary | ICD-10-CM | POA: Diagnosis not present

## 2021-09-07 DIAGNOSIS — R030 Elevated blood-pressure reading, without diagnosis of hypertension: Secondary | ICD-10-CM | POA: Diagnosis not present

## 2021-09-07 DIAGNOSIS — E538 Deficiency of other specified B group vitamins: Secondary | ICD-10-CM | POA: Diagnosis not present

## 2021-10-05 DIAGNOSIS — E538 Deficiency of other specified B group vitamins: Secondary | ICD-10-CM | POA: Diagnosis not present

## 2021-11-02 DIAGNOSIS — E538 Deficiency of other specified B group vitamins: Secondary | ICD-10-CM | POA: Diagnosis not present

## 2021-11-30 DIAGNOSIS — E538 Deficiency of other specified B group vitamins: Secondary | ICD-10-CM | POA: Diagnosis not present

## 2022-01-11 DIAGNOSIS — E1169 Type 2 diabetes mellitus with other specified complication: Secondary | ICD-10-CM | POA: Diagnosis not present

## 2022-01-11 DIAGNOSIS — I10 Essential (primary) hypertension: Secondary | ICD-10-CM | POA: Diagnosis not present

## 2022-01-11 DIAGNOSIS — E7800 Pure hypercholesterolemia, unspecified: Secondary | ICD-10-CM | POA: Diagnosis not present

## 2022-01-11 DIAGNOSIS — E538 Deficiency of other specified B group vitamins: Secondary | ICD-10-CM | POA: Diagnosis not present

## 2022-01-11 DIAGNOSIS — C50911 Malignant neoplasm of unspecified site of right female breast: Secondary | ICD-10-CM | POA: Diagnosis not present

## 2022-01-20 DIAGNOSIS — N760 Acute vaginitis: Secondary | ICD-10-CM | POA: Diagnosis not present

## 2022-02-08 DIAGNOSIS — N760 Acute vaginitis: Secondary | ICD-10-CM | POA: Diagnosis not present

## 2022-02-24 DIAGNOSIS — N760 Acute vaginitis: Secondary | ICD-10-CM | POA: Diagnosis not present

## 2022-03-03 ENCOUNTER — Other Ambulatory Visit: Payer: Self-pay | Admitting: Adult Health

## 2022-03-03 DIAGNOSIS — Z853 Personal history of malignant neoplasm of breast: Secondary | ICD-10-CM

## 2022-04-12 ENCOUNTER — Ambulatory Visit
Admission: RE | Admit: 2022-04-12 | Discharge: 2022-04-12 | Disposition: A | Discharge status: Home / Self Care | Payer: Medicare Other | Source: Ambulatory Visit | Attending: Adult Health | Admitting: Adult Health

## 2022-04-12 DIAGNOSIS — R922 Inconclusive mammogram: Secondary | ICD-10-CM | POA: Diagnosis not present

## 2022-04-12 DIAGNOSIS — Z853 Personal history of malignant neoplasm of breast: Secondary | ICD-10-CM | POA: Diagnosis not present

## 2022-05-19 ENCOUNTER — Telehealth: Payer: Self-pay | Admitting: *Deleted

## 2022-05-19 NOTE — Telephone Encounter (Signed)
Received Telephone Advice fax from after hours [facility]: Fax received in this office 05/19/22 @ 0900. Patient call received by them 05/18/22 @ 0853.   Patient reported to [facility]: "She had a lumpectomy in 2017. She was released from oncology visits last August by Dr. Jana Hakim.She has a red hard, collapsed area that is itchy as well near the lumpectomy site."  [facility] recommended patient see PCP within 3 days.  Information reported to Dr. Chryl Heck. Per Dr. Chryl Heck, If patient wants to be seen here as soon as next week, she can be scheduled with L. Causey NP as Dr. Chryl Heck has limited schedule during that time. Or if she  prefers to see MD, can be scheduled with Dr. Chryl Heck week of 10/9.  Contacted patient with this information. She reports she did not contact PCP. The area is small/red and itching less now. She reports routine mammogram in August 2023 ordered by PCP was ok. She asked to be scheduled w/Dr. Chryl Heck for an evaluation week of 10/9. Encouraged patient to contact this office if any concerns or changes prior to appt. Schedule message sent.  She verbalized understanding of all information.

## 2022-05-21 DIAGNOSIS — Z23 Encounter for immunization: Secondary | ICD-10-CM | POA: Diagnosis not present

## 2022-05-22 HISTORY — PX: BREAST BIOPSY: SHX20

## 2022-06-07 ENCOUNTER — Encounter: Payer: Self-pay | Admitting: Adult Health

## 2022-06-07 ENCOUNTER — Inpatient Hospital Stay: Payer: Medicare Other | Attending: Adult Health | Admitting: Adult Health

## 2022-06-07 VITALS — BP 168/80 | HR 96 | Temp 97.9°F | Resp 14 | Ht 61.0 in | Wt 131.4 lb

## 2022-06-07 DIAGNOSIS — C50411 Malignant neoplasm of upper-outer quadrant of right female breast: Secondary | ICD-10-CM | POA: Insufficient documentation

## 2022-06-07 DIAGNOSIS — Z170 Estrogen receptor positive status [ER+]: Secondary | ICD-10-CM | POA: Insufficient documentation

## 2022-06-07 DIAGNOSIS — Z79811 Long term (current) use of aromatase inhibitors: Secondary | ICD-10-CM | POA: Diagnosis not present

## 2022-06-07 DIAGNOSIS — Z923 Personal history of irradiation: Secondary | ICD-10-CM | POA: Diagnosis not present

## 2022-06-07 NOTE — Progress Notes (Signed)
[facility] Follow up:    Meghan Pillar, MD [address] [place] [address]   DIAGNOSIS:  Cancer Staging  Malignant neoplasm of upper-outer quadrant of right breast in female, estrogen receptor positive ([place]) Staging form: Breast, AJCC 7th Edition - Clinical: Stage IA (T1a, N0, M0) - Signed by Chauncey Cruel, MD on 03/17/2016   SUMMARY OF ONCOLOGIC HISTORY: Oncology History  Malignant neoplasm of upper-outer quadrant of right breast in female, estrogen receptor positive ([place])  03/17/2016 Initial Diagnosis   Breast cancer of upper-outer quadrant of right female breast ([place])   03/24/2016 Surgery   Right lumpectomy ([place]): ILC, grade 2, 1.3 cm, LCIS, margins negative, 3 SLN negative, ER+(95%), PR+(2%), Ki-67 10%, Her2 negative   03/24/2016 Oncotype testing   Oncotype score: 16, ROR 10%.    05/04/2016 - 06/01/2016 Radiation Therapy   Adjuvant Radiation ([place]): 1) Right Breast: 42.72 Gy in 16 fractions.  2) Right Breast Boost: 10 Gy in 5 fractions   07/22/2016 -  Anti-estrogen oral therapy   Anastrozole daily, 5 years of therapy planned.       CURRENT THERAPY: observation  INTERVAL HISTORY: Meghan Padilla 72 y.o. female returns for follow-up of her history of breast cancer and recent breast changes.  Her most recent mammogram was completed in August 2023 and showed no evidence of new or recurrent breast cancer benign postlumpectomy changes.  Her breast density was category C.  She notes a small red place in her right breast that began a couple of months ago.  This was initially itchy and she applied cortisone.  This has not improved prompting her to come in for evaluation.  This started after her mammogram was completed in August.     Patient Active Problem List   Diagnosis Date Noted   Overactive bladder 07/09/2021   Absolute anemia 06/14/2019   B12 deficiency 06/14/2019   Diabetes mellitus ([place]) 02/27/2019   Hyperlipidemia  11/20/2016   GERD (gastroesophageal reflux disease) 11/20/2016   Hypertensive disorder 11/20/2016   Rash 08/04/2016   Malignant neoplasm of upper-outer quadrant of right breast in female, estrogen receptor positive ([place]) 03/17/2016    is allergic to sulfa antibiotics.  MEDICAL HISTORY: Past Medical History:  Diagnosis Date   Breast cancer ([place])    Cancer ([place]) 2017   right breast   Diabetes mellitus    GERD (gastroesophageal reflux disease)    History of radiation therapy 05/04/16-06/01/16   right breast 42.72 Gy boost to 10 Gy   Hyperlipidemia    Hypertension    Personal history of radiation therapy 05/05/2016   ended radiaiton 06-01-2016   UTI (urinary tract infection)     SURGICAL HISTORY: Past Surgical History:  Procedure Laterality Date   BREAST BIOPSY Right 02/29/2016   BREAST BIOPSY Right 11/12/2019   BREAST BIOPSY Right 07/14/2020   BREAST LUMPECTOMY Right 03/24/2016   right 03/2016   DILATION AND CURETTAGE OF UTERUS     HAND SURGERY     HAND SURGERY     HYSTEROSCOPY WITH D & C  11/28/2008   RADIOACTIVE SEED GUIDED PARTIAL MASTECTOMY WITH AXILLARY SENTINEL LYMPH NODE BIOPSY Right 03/24/2016   Procedure: RADIOACTIVE SEED GUIDED PARTIAL MASTECTOMY WITH AXILLARY SENTINEL LYMPH NODE BIOPSY;  Surgeon: Erroll Luna, MD;  Location: [facility];  Service: General;  Laterality: Right;   TONSILLECTOMY      SOCIAL HISTORY: Social History   Socioeconomic History   Marital status: Married  Spouse name: Not on file   Number of children: 2   Years of education: Not on file   Highest education level: Not on file  Occupational History   Occupation: retired Print production planner  Tobacco Use   Smoking status: Never   Smokeless tobacco: Never  Substance and Sexual Activity   Alcohol use: No   Drug use: No   Sexual activity: Not on file  Other Topics Concern   Not on file  Social History Narrative   Not on file   Social Determinants of Health    Financial Resource Strain: Not on file  Food Insecurity: Not on file  Transportation Needs: Not on file  Physical Activity: Not on file  Stress: Not on file  Social Connections: Not on file  Intimate Partner Violence: Not on file    FAMILY HISTORY: Family History  Problem Relation Age of Onset   Cancer Neg Hx     Review of Systems  Constitutional:  Negative for appetite change, chills, fatigue, fever and unexpected weight change.  HENT:   Negative for hearing loss, lump/mass and trouble swallowing.   Eyes:  Negative for eye problems and icterus.  Respiratory:  Negative for chest tightness, cough and shortness of breath.   Cardiovascular:  Negative for chest pain, leg swelling and palpitations.  Gastrointestinal:  Negative for abdominal distention, abdominal pain, constipation, diarrhea, nausea and vomiting.  Endocrine: Negative for hot flashes.  Genitourinary:  Negative for difficulty urinating.   Musculoskeletal:  Negative for arthralgias.  Skin:  Negative for itching and rash.  Neurological:  Negative for dizziness, extremity weakness, headaches and numbness.  Hematological:  Negative for adenopathy. Does not bruise/bleed easily.  Psychiatric/Behavioral:  Negative for depression. The patient is not nervous/anxious.       PHYSICAL EXAMINATION  ECOG PERFORMANCE STATUS: 1 - Symptomatic but completely ambulatory  Vitals:   06/07/22 0914  BP: (!) 168/80  Pulse: 96  Resp: 14  Temp: 97.9 F (36.6 C)  SpO2: 99%    Physical Exam Constitutional:      General: She is not in acute distress.    Appearance: Normal appearance. She is not toxic-appearing.  HENT:     Head: Normocephalic and atraumatic.  Eyes:     General: No scleral icterus. Cardiovascular:     Rate and Rhythm: Normal rate and regular rhythm.     Pulses: Normal pulses.     Heart sounds: Normal heart sounds.  Pulmonary:     Effort: Pulmonary effort is normal.     Breath sounds: Normal breath sounds.   Chest:     Comments: Right breast status postlumpectomy and radiation with erythema and nodularity noted over the right upper breast lumpectomy site. the left breast is benign. Abdominal:     General: Abdomen is flat. Bowel sounds are normal. There is no distension.     Palpations: Abdomen is soft.     Tenderness: There is no abdominal tenderness.  Musculoskeletal:        General: No swelling.     Cervical back: Neck supple.  Lymphadenopathy:     Cervical: No cervical adenopathy.  Skin:    General: Skin is warm and dry.     Findings: No rash.  Neurological:     General: No focal deficit present.     Mental Status: She is alert.  Psychiatric:        Mood and Affect: Mood normal.        Behavior: Behavior normal.  LABORATORY DATA: None for this visit       ASSESSMENT and THERAPY PLAN:   Malignant neoplasm of upper-outer quadrant of right breast in female, estrogen receptor positive ([place]) Meghan Padilla is a 71 year old woman with h/o stage IA ER positiv/e right sided breast cancer diagnosed in 02/2016 s/p lumpectomy, adjuvant radiation therapy, and Anastrozole x 5 years completed in 07/2021.  In reviewing her right breast lumpectomy site, and new changes, I placed orders for diagnostic right breast mammogram and ultrasound for further evaluation.    I also reached out to her surgeon, Dr. Brantley Stage who will get her in int he next couple of weeks for evaluation and f/u of this.  Tomara and I discussed the option for her to f/u in long term survivorship which she would appreciate.  She will see me in 6 months for f/u.    All questions were answered. The patient knows to call the clinic with any problems, questions or concerns. We can certainly see the patient much sooner if necessary.  Total encounter time:20 minutes*in face-to-face visit time, chart review, lab review, care coordination, order entry, and documentation of the encounter time.  Wilber Bihari, NP 06/07/22 8:30  PM Medical Oncology and H[facility]ology [facility] [place], [place] [address] Tel. [phone]    Fax. [phone]  *Total Encounter Time as defined by the Centers for Medicare and Medicaid Services includes, in addition to the face-to-face time of a patient visit (documented in the note above) non-face-to-face time: obtaining and reviewing outside history, ordering and reviewing medications, tests or procedures, care coordination (communications with other health care professionals or caregivers) and documentation in the medical record.

## 2022-06-07 NOTE — Assessment & Plan Note (Signed)
Meghan Padilla is a 71 year old woman with h/o stage IA ER positiv/e right sided breast cancer diagnosed in 02/2016 s/p lumpectomy, adjuvant radiation therapy, and Anastrozole x 5 years completed in 07/2021.  In reviewing her right breast lumpectomy site, and new changes, I placed orders for diagnostic right breast mammogram and ultrasound for further evaluation.    I also reached out to her surgeon, Dr. Brantley Stage who will get her in int he next couple of weeks for evaluation and f/u of this.  Breon and I discussed the option for her to f/u in long term survivorship which she would appreciate.  She will see me in 6 months for f/u.

## 2022-06-09 ENCOUNTER — Other Ambulatory Visit: Payer: Self-pay | Admitting: Adult Health

## 2022-06-09 ENCOUNTER — Ambulatory Visit
Admission: RE | Admit: 2022-06-09 | Discharge: 2022-06-09 | Disposition: A | Discharge status: Home / Self Care | Payer: Medicare Other | Source: Ambulatory Visit | Attending: Adult Health | Admitting: Adult Health

## 2022-06-09 DIAGNOSIS — R92331 Mammographic heterogeneous density, right breast: Secondary | ICD-10-CM | POA: Diagnosis not present

## 2022-06-09 DIAGNOSIS — N641 Fat necrosis of breast: Secondary | ICD-10-CM | POA: Diagnosis not present

## 2022-06-09 DIAGNOSIS — Z170 Estrogen receptor positive status [ER+]: Secondary | ICD-10-CM

## 2022-06-09 DIAGNOSIS — Z853 Personal history of malignant neoplasm of breast: Secondary | ICD-10-CM | POA: Diagnosis not present

## 2022-06-09 DIAGNOSIS — L539 Erythematous condition, unspecified: Secondary | ICD-10-CM

## 2022-06-16 ENCOUNTER — Ambulatory Visit
Admission: RE | Admit: 2022-06-16 | Discharge: 2022-06-16 | Disposition: A | Discharge status: Home / Self Care | Payer: Medicare Other | Source: Ambulatory Visit | Attending: Adult Health | Admitting: Adult Health

## 2022-06-16 DIAGNOSIS — L539 Erythematous condition, unspecified: Secondary | ICD-10-CM

## 2022-06-16 DIAGNOSIS — Z170 Estrogen receptor positive status [ER+]: Secondary | ICD-10-CM

## 2022-06-16 DIAGNOSIS — N6489 Other specified disorders of breast: Secondary | ICD-10-CM | POA: Diagnosis not present

## 2022-06-20 ENCOUNTER — Other Ambulatory Visit: Payer: Self-pay | Admitting: Surgery

## 2022-06-20 DIAGNOSIS — Z853 Personal history of malignant neoplasm of breast: Secondary | ICD-10-CM | POA: Diagnosis not present

## 2022-06-20 DIAGNOSIS — L989 Disorder of the skin and subcutaneous tissue, unspecified: Secondary | ICD-10-CM | POA: Diagnosis not present

## 2022-06-20 DIAGNOSIS — L910 Hypertrophic scar: Secondary | ICD-10-CM | POA: Diagnosis not present

## 2022-06-20 DIAGNOSIS — R229 Localized swelling, mass and lump, unspecified: Secondary | ICD-10-CM | POA: Diagnosis not present

## 2022-06-30 DIAGNOSIS — N3281 Overactive bladder: Secondary | ICD-10-CM | POA: Diagnosis not present

## 2022-06-30 DIAGNOSIS — N3941 Urge incontinence: Secondary | ICD-10-CM | POA: Diagnosis not present

## 2022-06-30 DIAGNOSIS — R350 Frequency of micturition: Secondary | ICD-10-CM | POA: Diagnosis not present

## 2022-07-05 ENCOUNTER — Other Ambulatory Visit: Payer: Self-pay | Admitting: Surgery

## 2022-07-05 DIAGNOSIS — Z853 Personal history of malignant neoplasm of breast: Secondary | ICD-10-CM | POA: Diagnosis not present

## 2022-07-05 DIAGNOSIS — D235 Other benign neoplasm of skin of trunk: Secondary | ICD-10-CM | POA: Diagnosis not present

## 2022-07-12 ENCOUNTER — Encounter: Payer: Self-pay | Admitting: Surgery

## 2022-07-19 DIAGNOSIS — E7800 Pure hypercholesterolemia, unspecified: Secondary | ICD-10-CM | POA: Diagnosis not present

## 2022-07-19 DIAGNOSIS — I10 Essential (primary) hypertension: Secondary | ICD-10-CM | POA: Diagnosis not present

## 2022-07-19 DIAGNOSIS — B009 Herpesviral infection, unspecified: Secondary | ICD-10-CM | POA: Diagnosis not present

## 2022-07-19 DIAGNOSIS — Z0000 Encounter for general adult medical examination without abnormal findings: Secondary | ICD-10-CM | POA: Diagnosis not present

## 2022-07-19 DIAGNOSIS — R32 Unspecified urinary incontinence: Secondary | ICD-10-CM | POA: Diagnosis not present

## 2022-07-19 DIAGNOSIS — N952 Postmenopausal atrophic vaginitis: Secondary | ICD-10-CM | POA: Diagnosis not present

## 2022-07-19 DIAGNOSIS — Z01419 Encounter for gynecological examination (general) (routine) without abnormal findings: Secondary | ICD-10-CM | POA: Diagnosis not present

## 2022-07-19 DIAGNOSIS — K219 Gastro-esophageal reflux disease without esophagitis: Secondary | ICD-10-CM | POA: Diagnosis not present

## 2022-07-19 DIAGNOSIS — E538 Deficiency of other specified B group vitamins: Secondary | ICD-10-CM | POA: Diagnosis not present

## 2022-07-19 DIAGNOSIS — M8588 Other specified disorders of bone density and structure, other site: Secondary | ICD-10-CM | POA: Diagnosis not present

## 2022-07-19 DIAGNOSIS — C50911 Malignant neoplasm of unspecified site of right female breast: Secondary | ICD-10-CM | POA: Diagnosis not present

## 2022-07-19 DIAGNOSIS — E1169 Type 2 diabetes mellitus with other specified complication: Secondary | ICD-10-CM | POA: Diagnosis not present

## 2022-07-19 DIAGNOSIS — D509 Iron deficiency anemia, unspecified: Secondary | ICD-10-CM | POA: Diagnosis not present

## 2022-07-19 DIAGNOSIS — Z23 Encounter for immunization: Secondary | ICD-10-CM | POA: Diagnosis not present

## 2022-07-19 DIAGNOSIS — Z6824 Body mass index (BMI) 24.0-24.9, adult: Secondary | ICD-10-CM | POA: Diagnosis not present

## 2022-07-20 ENCOUNTER — Other Ambulatory Visit: Payer: Medicare Other

## 2022-12-06 ENCOUNTER — Encounter: Payer: Self-pay | Admitting: Adult Health

## 2022-12-06 ENCOUNTER — Inpatient Hospital Stay: Payer: Medicare Other | Attending: Adult Health | Admitting: Adult Health

## 2022-12-06 VITALS — BP 149/67 | HR 92 | Temp 98.4°F | Resp 16 | Ht 61.0 in | Wt 133.1 lb

## 2022-12-06 DIAGNOSIS — C50411 Malignant neoplasm of upper-outer quadrant of right female breast: Secondary | ICD-10-CM | POA: Diagnosis not present

## 2022-12-06 DIAGNOSIS — Z170 Estrogen receptor positive status [ER+]: Secondary | ICD-10-CM | POA: Insufficient documentation

## 2022-12-06 DIAGNOSIS — Z853 Personal history of malignant neoplasm of breast: Secondary | ICD-10-CM | POA: Diagnosis not present

## 2022-12-06 DIAGNOSIS — Z79811 Long term (current) use of aromatase inhibitors: Secondary | ICD-10-CM | POA: Diagnosis not present

## 2022-12-06 NOTE — Assessment & Plan Note (Addendum)
Meghan Padilla is a 71 year old woman with h/o stage IA ER positiv/e right sided breast cancer diagnosed in 02/2016 s/p lumpectomy, adjuvant radiation therapy, and Anastrozole x 5 years completed in 07/2021.  History of right-sided breast cancer: She has no clinical or radiographic signs of breast cancer recurrence.  She will continue with annual mammograms next due in August 2024. Bone health: We are reaching out to physicians for women to obtain any more recent bone density testing than July 2019.  She was given information on bone health in her after visit summary. Health maintenance: Is recommended to continue follow-up with her primary care provider.  Healthy diet and exercise were recommended as well.  We will see her back in 1 year for continued long-term follow-up.  She knows to reach out for any questions or concerns that may arise between now and her next appointment with [place].

## 2022-12-06 NOTE — Patient Instructions (Signed)
Bone Health Bones protect organs, store calcium, anchor muscles, and support the whole body. Keeping your bones strong is important, especially as you get older. You can take actions to help keep your bones strong and healthy. Why is keeping my bones healthy important?  Keeping your bones healthy is important because your body constantly replaces bone cells. Cells get old, and new cells take their place. As we age, we lose bone cells because the body may not be able to make enough new cells to replace the old cells. The amount of bone cells and bone tissue you have is referred to as bone mass. The higher your bone mass, the stronger your bones. The aging process leads to an overall loss of bone mass in the body, which can increase the likelihood of: Broken bones. A condition in which the bones become weak and brittle (osteoporosis). A large decline in bone mass occurs in older adults. In women, it occurs about the time of menopause. What actions can I take to keep my bones healthy? Good health habits are important for maintaining healthy bones. This includes eating nutritious foods and exercising regularly. To have healthy bones, you need to get enough of the right minerals and vitamins. Most nutrition experts recommend getting these nutrients from the foods that you eat. In some cases, taking supplements may also be recommended. Doing certain types of exercise is also important for bone health. What are the nutritional recommendations for healthy bones?  Eating a well-balanced diet with plenty of calcium and vitamin D will help to protect your bones. Nutritional recommendations vary from person to person. Ask your health care provider what is healthy for you. Here are some general guidelines. Get enough calcium Calcium is the most important (essential) mineral for bone health. Most people can get enough calcium from their diet, but supplements may be recommended for people who are at risk for  osteoporosis. Good sources of calcium include: Dairy products, such as low-fat or nonfat milk, cheese, and yogurt. Dark green leafy vegetables, such as bok choy and broccoli. Foods that have calcium added to them (are fortified). Foods that may be fortified with calcium include orange juice, cereal, bread, soy beverages, and tofu products. Nuts, such as almonds. Follow these recommended amounts for daily calcium intake: Infants, 0-6 months: 200 mg. Infants, 6-12 months: 260 mg. Children, age 1-3: 700 mg. Children, age 4-8: 1,000 mg. Children, age 9-13: 1,300 mg. Teens, age 14-18: 1,300 mg. Adults, age 19-50: 1,000 mg. Adults, age 51-70: Men: 1,000 mg. Women: 1,200 mg. Adults, age 71 or older: 1,200 mg. Pregnant and breastfeeding females: Teens: 1,300 mg. Adults: 1,000 mg. Get enough vitamin D Vitamin D is the most essential vitamin for bone health. It helps the body absorb calcium. Sunlight stimulates the skin to make vitamin D, so be sure to get enough sunlight. If you live in a cold climate or you do not get outside often, your health care provider may recommend that you take vitamin D supplements. Good sources of vitamin D in your diet include: Egg yolks. Saltwater fish. Milk and cereal fortified with vitamin D. Follow these recommended amounts for daily vitamin D intake: Infants, 0-12 months: 400 international units (IU). Children and teens, age 1-18: 600 international units. Adults, age 59 or younger: 600 international units. Adults, age 60 or older: 600-1,000 international units. Get other important nutrients Other nutrients that are important for bone health include: Phosphorus. This mineral is found in meat, poultry, dairy foods, nuts, and legumes. The   recommended daily intake for adult men and adult women is 700 mg. Magnesium. This mineral is found in seeds, nuts, dark green vegetables, and legumes. The recommended daily intake for adult men is 400-420 mg. For adult women,  it is 310-320 mg. Vitamin K. This vitamin is found in green leafy vegetables. The recommended daily intake is 120 mcg for adult men and 90 mcg for adult women. What type of physical activity is best for building and maintaining healthy bones? Weight-bearing and strength-building activities are important for building and maintaining healthy bones. Weight-bearing activities cause muscles and bones to work against gravity. Strength-building activities increase the strength of the muscles that support bones. Weight-bearing and muscle-building activities include: Walking and hiking. Jogging and running. Dancing. Gym exercises. Lifting weights. Tennis and racquetball. Climbing stairs. Aerobics. Adults should get at least 30 minutes of moderate physical activity on most days. Children should get at least 60 minutes of moderate physical activity on most days. Ask your health care provider what type of exercise is best for you. How can I find out if my bone mass is low? Bone mass can be measured with an X-ray test called a bone mineral density (BMD) test. This test is recommended for all women who are age 65 or older. It may also be recommended for: Men who are age 70 or older. People who are at risk for osteoporosis because of: Having a long-term disease that weakens bones, such as kidney disease or rheumatoid arthritis. Having menopause earlier than normal. Taking medicine that weakens bones, such as steroids, thyroid hormones, or hormone treatment for breast cancer or prostate cancer. Smoking. Drinking three or more alcoholic drinks a day. Being underweight. Sedentary lifestyle. If you find that you have a low bone mass, you may be able to prevent osteoporosis or further bone loss by changing your diet and lifestyle. Where can I find more information? Bone Health & Osteoporosis Foundation: www.nof.org/patients National Institutes of Health: www.bones.nih.gov International Osteoporosis  Foundation: www.iofbonehealth.org Summary The aging process leads to an overall loss of bone mass in the body, which can increase the likelihood of broken bones and osteoporosis. Eating a well-balanced diet with plenty of calcium and vitamin D will help to protect your bones. Weight-bearing and strength-building activities are also important for building and maintaining strong bones. Bone mass can be measured with an X-ray test called a bone mineral density (BMD) test. This information is not intended to replace advice given to you by your health care provider. Make sure you discuss any questions you have with your health care provider. Document Revised: 01/20/2021 Document Reviewed: 01/20/2021 Elsevier Patient Education  2023 [employer]  

## 2022-12-06 NOTE — Progress Notes (Signed)
[facility] Cancer Follow up:    Padilla, Meghan Knudsen, MD [address] [place] [address]   DIAGNOSIS:  Cancer Staging  Malignant neoplasm of upper-outer quadrant of right breast in female, estrogen receptor positive Staging form: Breast, AJCC 7th Edition - Clinical: Stage IA (T1a, N0, M0) - Signed by Lowella Dell, MD on 03/17/2016   SUMMARY OF ONCOLOGIC HISTORY: Oncology History  Malignant neoplasm of upper-outer quadrant of right breast in female, estrogen receptor positive  03/17/2016 Initial Diagnosis   Breast cancer of upper-outer quadrant of right female breast (HCC)   03/24/2016 Surgery   Right lumpectomy ([facility]): ILC, grade 2, 1.3 cm, LCIS, margins negative, 3 SLN negative, ER+(95%), PR+(2%), Ki-67 10%, Her2 negative   03/24/2016 Oncotype testing   Oncotype score: 16, ROR 10%.    05/04/2016 - 06/01/2016 Radiation Therapy   Adjuvant Radiation ([facility]): 1) Right Breast: 42.72 Gy in 16 fractions.  2) Right Breast Boost: 10 Gy in 5 fractions   07/22/2016 -  Anti-estrogen oral therapy   Anastrozole daily, 5 years of therapy planned.       CURRENT THERAPY: observation  INTERVAL HISTORY: Meghan Padilla 72 y.o. female returns for follow-up of her history of breast cancer.    Her most recent mammogram occurred on June 09, 2022 it was of the right breast and demonstrated a stable right lumpectomy site with discrete erythema.  An ultrasound was performed which showed fat necrosis and that is a cause of her erythema was unusual.  She was prescribed Keflex for 7 days and repeat ultrasound demonstrated persistent focal redness despite recent antibiotics and surgical consultation was obtained with Dr. Luisa Hart on July 05, 2022.  He performed a punch biopsy on that day and pathology demonstrated no recurrence of her breast cancer however she did have a dermatofibroma which is a benign scarlike growth.  Dr. Luisa Hart commented that excision could be  considered if this area changes.  Her most recent bilateral diagnostic breast mammogram was completed on April 12, 2022 demonstrating no mammographic evidence of malignancy and breast density category C.  The most recent bone density testing we have on record was completed on February 20, 2018 demonstrating mild osteopenia with a T-score -1.6 in the right femoral neck.  Patient Active Problem List   Diagnosis Date Noted   Overactive bladder 07/09/2021   Absolute anemia 06/14/2019   B12 deficiency 06/14/2019   Diabetes mellitus 02/27/2019   Hyperlipidemia 11/20/2016   GERD (gastroesophageal reflux disease) 11/20/2016   Hypertensive disorder 11/20/2016   Rash 08/04/2016   Malignant neoplasm of upper-outer quadrant of right breast in female, estrogen receptor positive 03/17/2016    is allergic to sulfa antibiotics.  MEDICAL HISTORY: Past Medical History:  Diagnosis Date   Breast cancer    Cancer 2017   right breast   Diabetes mellitus    GERD (gastroesophageal reflux disease)    History of radiation therapy 05/04/16-06/01/16   right breast 42.72 Gy boost to 10 Gy   Hyperlipidemia    Hypertension    Personal history of radiation therapy 05/05/2016   ended radiaiton 06-01-2016   UTI (urinary tract infection)     SURGICAL HISTORY: Past Surgical History:  Procedure Laterality Date   BREAST BIOPSY Right 02/29/2016   BREAST BIOPSY Right 11/12/2019   BREAST BIOPSY Right 07/14/2020   BREAST LUMPECTOMY Right 03/24/2016   right 03/2016   DILATION AND CURETTAGE OF UTERUS     HAND SURGERY  HAND SURGERY     HYSTEROSCOPY WITH D & C  11/28/2008   RADIOACTIVE SEED GUIDED PARTIAL MASTECTOMY WITH AXILLARY SENTINEL LYMPH NODE BIOPSY Right 03/24/2016   Procedure: RADIOACTIVE SEED GUIDED PARTIAL MASTECTOMY WITH AXILLARY SENTINEL LYMPH NODE BIOPSY;  Surgeon: Harriette Bouillon, MD;  Location:  SURGERY CENTER;  Service: General;  Laterality: Right;   TONSILLECTOMY      SOCIAL  HISTORY: Social History   Socioeconomic History   Marital status: Married    Spouse name: Not on file   Number of children: 2   Years of education: Not on file   Highest education level: Not on file  Occupational History   Occupation: retired Manufacturing systems engineer  Tobacco Use   Smoking status: Never   Smokeless tobacco: Never  Substance and Sexual Activity   Alcohol use: No   Drug use: No   Sexual activity: Not on file  Other Topics Concern   Not on file  Social History Narrative   Not on file   Social Determinants of Health   Financial Resource Strain: Not on file  Food Insecurity: Not on file  Transportation Needs: Not on file  Physical Activity: Not on file  Stress: Not on file  Social Connections: Not on file  Intimate Partner Violence: Not on file    FAMILY HISTORY: Family History  Problem Relation Age of Onset   Cancer Neg Hx     Review of Systems  Constitutional:  Negative for appetite change, chills, fatigue, fever and unexpected weight change.  HENT:   Negative for hearing loss, lump/mass and trouble swallowing.   Eyes:  Negative for eye problems and icterus.  Respiratory:  Negative for chest tightness, cough and shortness of breath.   Cardiovascular:  Negative for chest pain, leg swelling and palpitations.  Gastrointestinal:  Negative for abdominal distention, abdominal pain, constipation, diarrhea, nausea and vomiting.  Endocrine: Negative for hot flashes.  Genitourinary:  Negative for difficulty urinating.   Musculoskeletal:  Negative for arthralgias.  Skin:  Negative for itching and rash.  Neurological:  Negative for dizziness, extremity weakness, headaches and numbness.  Hematological:  Negative for adenopathy. Does not bruise/bleed easily.  Psychiatric/Behavioral:  Negative for depression. The patient is not nervous/anxious.       PHYSICAL EXAMINATION    Vitals:   12/06/22 1043  BP: (!) 149/67  Pulse: 92  Resp: 16  Temp: 98.4 F (36.9 C)   SpO2: 99%    Physical Exam Constitutional:      General: She is not in acute distress.    Appearance: Normal appearance. She is not toxic-appearing.  HENT:     Head: Normocephalic and atraumatic.  Eyes:     General: No scleral icterus. Cardiovascular:     Rate and Rhythm: Normal rate and regular rhythm.     Pulses: Normal pulses.     Heart sounds: Normal heart sounds.  Pulmonary:     Effort: Pulmonary effort is normal.     Breath sounds: Normal breath sounds.  Chest:     Comments: Right breast status postlumpectomy and radiation no sign of local recurrence left breast is benign. Abdominal:     General: Abdomen is flat. Bowel sounds are normal. There is no distension.     Palpations: Abdomen is soft.     Tenderness: There is no abdominal tenderness.  Musculoskeletal:        General: No swelling.     Cervical back: Neck supple.  Lymphadenopathy:     Cervical: No  cervical adenopathy.  Skin:    General: Skin is warm and dry.     Findings: No rash.  Neurological:     General: No focal deficit present.     Mental Status: She is alert.  Psychiatric:        Mood and Affect: Mood normal.        Behavior: Behavior normal.     LABORATORY DATA:  None for this visit  ASSESSMENT and THERAPY PLAN:   Malignant neoplasm of upper-outer quadrant of right breast in female, estrogen receptor positive (HCC) Meghan Padilla is a 72 year old woman with h/o stage IA ER positiv/e right sided breast cancer diagnosed in 02/2016 s/p lumpectomy, adjuvant radiation therapy, and Anastrozole x 5 years completed in 07/2021.  History of right-sided breast cancer: She has no clinical or radiographic signs of breast cancer recurrence.  She will continue with annual mammograms next due in August 2024. Bone health: We are reaching out to physicians for women to obtain any more recent bone density testing than July 2019.  She was given information on bone health in her after visit summary. Health maintenance: Is  recommended to continue follow-up with her primary care provider.  Healthy diet and exercise were recommended as well.  We will see her back in 1 year for continued long-term follow-up.  She knows to reach out for any questions or concerns that may arise between now and her next appointment with [place].    All questions were answered. The patient knows to call the clinic with any problems, questions or concerns. We can certainly see the patient much sooner if necessary.  Total encounter time:20 minutes*in face-to-face visit time, chart review, lab review, care coordination, order entry, and documentation of the encounter time.    Lillard Anes, NP 12/06/22 11:19 AM Medical Oncology and Hematology [facility] [address] [place], [place] [address] Tel. [phone]    Fax. [phone]  *Total Encounter Time as defined by the Centers for Medicare and Medicaid Services includes, in addition to the face-to-face time of a patient visit (documented in the note above) non-face-to-face time: obtaining and reviewing outside history, ordering and reviewing medications, tests or procedures, care coordination (communications with other health care professionals or caregivers) and documentation in the medical record.

## 2023-01-15 ENCOUNTER — Encounter (HOSPITAL_BASED_OUTPATIENT_CLINIC_OR_DEPARTMENT_OTHER): Payer: Self-pay | Admitting: Emergency Medicine

## 2023-01-15 ENCOUNTER — Emergency Department (HOSPITAL_BASED_OUTPATIENT_CLINIC_OR_DEPARTMENT_OTHER)
Admission: EM | Admit: 2023-01-15 | Discharge: 2023-01-15 | Disposition: A | Discharge status: Home / Self Care | Payer: Medicare Other | Attending: Emergency Medicine | Admitting: Emergency Medicine

## 2023-01-15 ENCOUNTER — Emergency Department (HOSPITAL_BASED_OUTPATIENT_CLINIC_OR_DEPARTMENT_OTHER): Payer: Medicare Other | Admitting: Radiology

## 2023-01-15 ENCOUNTER — Other Ambulatory Visit: Payer: Self-pay

## 2023-01-15 DIAGNOSIS — Z7984 Long term (current) use of oral hypoglycemic drugs: Secondary | ICD-10-CM | POA: Insufficient documentation

## 2023-01-15 DIAGNOSIS — I10 Essential (primary) hypertension: Secondary | ICD-10-CM | POA: Diagnosis not present

## 2023-01-15 DIAGNOSIS — E119 Type 2 diabetes mellitus without complications: Secondary | ICD-10-CM | POA: Diagnosis not present

## 2023-01-15 DIAGNOSIS — R0789 Other chest pain: Secondary | ICD-10-CM | POA: Diagnosis not present

## 2023-01-15 DIAGNOSIS — R079 Chest pain, unspecified: Secondary | ICD-10-CM | POA: Insufficient documentation

## 2023-01-15 LAB — BASIC METABOLIC PANEL
Anion gap: 10 (ref 5–15)
BUN: 5 mg/dL — ABNORMAL LOW (ref 8–23)
CO2: 26 mmol/L (ref 22–32)
Calcium: 9.3 mg/dL (ref 8.9–10.3)
Chloride: 107 mmol/L (ref 98–111)
Creatinine, Ser: 0.62 mg/dL (ref 0.44–1.00)
GFR, Estimated: >60 mL/min (ref >60)
Glucose, Bld: 157 mg/dL — ABNORMAL HIGH (ref 70–99)
Potassium: 3.9 mmol/L (ref 3.5–5.1)
Sodium: 143 mmol/L (ref 135–145)

## 2023-01-15 LAB — CBC
HCT: 42.3 % (ref 36.0–46.0)
Hemoglobin: 14 g/dL (ref 12.0–15.0)
MCH: 29.5 pg (ref 26.0–34.0)
MCHC: 33.1 g/dL (ref 30.0–36.0)
MCV: 89.1 fL (ref 80.0–100.0)
Platelets: 253 10*3/uL (ref 150–400)
RBC: 4.75 MIL/uL (ref 3.87–5.11)
RDW: 13.1 % (ref 11.5–15.5)
WBC: 8.4 10*3/uL (ref 4.0–10.5)
nRBC: 0 % (ref 0.0–0.2)

## 2023-01-15 LAB — TROPONIN I (HIGH SENSITIVITY)
Troponin I (High Sensitivity): <2 ng/L (ref <18)
Troponin I (High Sensitivity): 2 ng/L (ref <18)

## 2023-01-15 NOTE — ED Notes (Signed)
Patient transported to X-ray 

## 2023-01-15 NOTE — ED Provider Notes (Signed)
EMERGENCY DEPARTMENT AT [facility] Provider Note   CSN: [id number] Arrival date & time: 01/15/23  1149     History  No chief complaint on file.   Meghan Padilla is a 72 y.o. female.  HPI   72 year old female with past medical history of HTN, DM presents emergency department with left-sided chest discomfort.  Patient states that she has had nasal congestion and sinus headache.  Yesterday she took a dose of Sudafed.  Following that she developed left-sided chest heaviness that self resolved.  Since then she is been having intermittent episodes of left-sided chest heaviness that radiates to the left shoulder but then self resolves.  She also endorses mild indigestion improved with over-the-counter medications.  Currently she is chest pain-free.  Denies any associated shortness of breath, swelling of her lower extremities.  No other acute change in her health.  Home Medications Prior to Admission medications   Medication Sig Start Date End Date Taking? Authorizing Provider  atorvastatin (LIPITOR) 40 MG tablet Take 40 mg by mouth daily.    [provider]  metFORMIN (GLUCOPHAGE) 500 MG tablet Take 4 tablets (2,000 mg total) by mouth at bedtime. 06/05/18   Magrinat, Valentino Hue, MD  omeprazole (PRILOSEC) 20 MG capsule Take 20 mg by mouth daily.    [provider]  Probiotic Product (PROBIOTIC DAILY) CAPS Take by mouth. 06/05/18   Magrinat, Valentino Hue, MD  quinapril (ACCUPRIL) 20 MG tablet Take 20 mg by mouth at bedtime.    [provider]  tolterodine (DETROL LA) 4 MG 24 hr capsule TAKE 1 CAPSULE BY MOUTH EVERY DAY 06/05/20   McKenzie, Mardene Celeste, MD      Allergies    Sulfa antibiotics    Review of Systems   Review of Systems  Constitutional:  Negative for fever.  Respiratory:  Positive for chest tightness. Negative for shortness of breath.   Cardiovascular:  Positive for chest pain. Negative for palpitations and leg swelling.   Gastrointestinal:  Negative for abdominal pain, diarrhea and vomiting.  Genitourinary:  Negative for flank pain.  Musculoskeletal:  Negative for back pain.  Skin:  Negative for rash.  Neurological:  Negative for headaches.    Physical Exam Updated Vital Signs BP 136/79   Pulse 97   Temp 98.2 F (36.8 C) (Oral)   Resp (!) 21   Wt 56.7 kg   SpO2 99%   BMI 23.62 kg/m  Physical Exam Vitals and nursing note reviewed.  Constitutional:      General: She is not in acute distress.    Appearance: Normal appearance.  HENT:     Head: Normocephalic.     Mouth/Throat:     Mouth: Mucous membranes are moist.  Cardiovascular:     Rate and Rhythm: Normal rate.  Pulmonary:     Effort: Pulmonary effort is normal. No respiratory distress.  Abdominal:     Palpations: Abdomen is soft.     Tenderness: There is no abdominal tenderness.  Skin:    General: Skin is warm.  Neurological:     Mental Status: She is alert and oriented to person, place, and time. Mental status is at baseline.  Psychiatric:        Mood and Affect: Mood normal.     ED Results / Procedures / Treatments   Labs (all labs ordered are listed, but only abnormal results are displayed) Labs Reviewed  BASIC METABOLIC PANEL - Abnormal; Notable for the following components:  Result Value   Glucose, Bld 157 (*)    BUN 5 (*)    All other components within normal limits  CBC  TROPONIN I (HIGH SENSITIVITY)  TROPONIN I (HIGH SENSITIVITY)    EKG EKG Interpretation  Date/Time:  Sunday Jan 15 2023 12:01:26 EDT Ventricular Rate:  98 PR Interval:  128 QRS Duration: 81 QT Interval:  380 QTC Calculation: 486 R Axis:   49 Text Interpretation: Sinus rhythm Low voltage, precordial leads Minimal ST depression, anterolateral leads Borderline prolonged QT interval Confirmed by Coralee Pesa [id number]) on 01/15/2023 1:20:04 PM  Radiology DG Chest 2 View  Result Date: 01/15/2023 CLINICAL DATA:  Chest pain EXAM: CHEST - 2  VIEW COMPARISON:  None Available. FINDINGS: The heart size and mediastinal contours are within normal limits. Both lungs are clear. Disc degenerative disease of the thoracic spine. IMPRESSION: No acute abnormality of the lungs. Electronically Signed   By: Jearld Lesch M.D.   On: 01/15/2023 12:30    Procedures Procedures    Medications Ordered in ED Medications - No data to display  ED Course/ Medical Decision Making/ A&P                             Medical Decision Making Amount and/or Complexity of Data Reviewed Labs: ordered. Radiology: ordered.   72 year old female presents emergency department intermittent left-sided chest pain.  Ongoing since yesterday.  Self resolved and is currently chest pain-free.  Was hypertensive on arrival but this naturally downtrended.  1 dose of Sudafed yesterday afternoon.  EKG is baseline for the patient.  Chest x-ray shows no acute finding.  Blood work is reassuring without any acute abnormality including 2 negative troponins with no delta.  On reevaluation patient continues to remain chest pain-free.  Low suspicion for ACS.  Low suspicion for dissection/PE given lack of shortness of breath, normal vital signs and she is currently pain-free.  Patient at this time appears safe and stable for discharge and close outpatient follow up. Discharge plan and strict return to ED precautions discussed, patient verbalizes understanding and agreement.  ER doctor        Final Clinical Impression(s) / ED Diagnoses Final diagnoses:  None    Rx / DC Orders ED Discharge Orders     None         Rozelle Logan, DO 01/15/23 1526

## 2023-01-15 NOTE — ED Triage Notes (Signed)
Chest pain ,feels heavy,started yesterday. Feels pain in right shoulder but intermittent. Chest pain intermittent as well.

## 2023-01-15 NOTE — Discharge Instructions (Signed)
You have been seen and discharged from the emergency department.  Your workup here in the department was normal, including 2 normal heart enzymes.  Follow-up with your primary provider for further evaluation and further care. Take home medications as prescribed. If you have any worsening symptoms or further concerns for your health please return to an emergency department for further evaluation.

## 2023-01-15 NOTE — ED Triage Notes (Signed)
Pt took some sudafed on Friday and yesterday for sinus pressure in her temple. She doesn't usually take this.

## 2023-01-17 DIAGNOSIS — N898 Other specified noninflammatory disorders of vagina: Secondary | ICD-10-CM | POA: Diagnosis not present

## 2023-01-17 DIAGNOSIS — K219 Gastro-esophageal reflux disease without esophagitis: Secondary | ICD-10-CM | POA: Diagnosis not present

## 2023-01-17 DIAGNOSIS — E7800 Pure hypercholesterolemia, unspecified: Secondary | ICD-10-CM | POA: Diagnosis not present

## 2023-01-17 DIAGNOSIS — R0789 Other chest pain: Secondary | ICD-10-CM | POA: Diagnosis not present

## 2023-01-17 DIAGNOSIS — E1169 Type 2 diabetes mellitus with other specified complication: Secondary | ICD-10-CM | POA: Diagnosis not present

## 2023-01-17 DIAGNOSIS — Z7984 Long term (current) use of oral hypoglycemic drugs: Secondary | ICD-10-CM | POA: Diagnosis not present

## 2023-01-17 DIAGNOSIS — D509 Iron deficiency anemia, unspecified: Secondary | ICD-10-CM | POA: Diagnosis not present

## 2023-01-17 DIAGNOSIS — B351 Tinea unguium: Secondary | ICD-10-CM | POA: Diagnosis not present

## 2023-01-17 DIAGNOSIS — C50911 Malignant neoplasm of unspecified site of right female breast: Secondary | ICD-10-CM | POA: Diagnosis not present

## 2023-01-17 DIAGNOSIS — I10 Essential (primary) hypertension: Secondary | ICD-10-CM | POA: Diagnosis not present

## 2023-01-17 DIAGNOSIS — R32 Unspecified urinary incontinence: Secondary | ICD-10-CM | POA: Diagnosis not present

## 2023-01-17 DIAGNOSIS — E538 Deficiency of other specified B group vitamins: Secondary | ICD-10-CM | POA: Diagnosis not present

## 2023-03-15 ENCOUNTER — Other Ambulatory Visit: Payer: Self-pay | Admitting: Adult Health

## 2023-03-15 DIAGNOSIS — Z853 Personal history of malignant neoplasm of breast: Secondary | ICD-10-CM

## 2023-04-21 ENCOUNTER — Other Ambulatory Visit: Payer: Self-pay | Admitting: Adult Health

## 2023-04-21 ENCOUNTER — Ambulatory Visit
Admission: RE | Admit: 2023-04-21 | Discharge: 2023-04-21 | Disposition: A | Discharge status: Home / Self Care | Payer: Medicare Other | Source: Ambulatory Visit | Attending: Adult Health | Admitting: Adult Health

## 2023-04-21 DIAGNOSIS — Z853 Personal history of malignant neoplasm of breast: Secondary | ICD-10-CM

## 2023-04-21 DIAGNOSIS — Z1231 Encounter for screening mammogram for malignant neoplasm of breast: Secondary | ICD-10-CM | POA: Diagnosis not present

## 2023-07-31 DIAGNOSIS — B351 Tinea unguium: Secondary | ICD-10-CM | POA: Diagnosis not present

## 2023-07-31 DIAGNOSIS — E538 Deficiency of other specified B group vitamins: Secondary | ICD-10-CM | POA: Diagnosis not present

## 2023-07-31 DIAGNOSIS — C50911 Malignant neoplasm of unspecified site of right female breast: Secondary | ICD-10-CM | POA: Diagnosis not present

## 2023-07-31 DIAGNOSIS — I10 Essential (primary) hypertension: Secondary | ICD-10-CM | POA: Diagnosis not present

## 2023-07-31 DIAGNOSIS — D509 Iron deficiency anemia, unspecified: Secondary | ICD-10-CM | POA: Diagnosis not present

## 2023-07-31 DIAGNOSIS — B009 Herpesviral infection, unspecified: Secondary | ICD-10-CM | POA: Diagnosis not present

## 2023-07-31 DIAGNOSIS — R634 Abnormal weight loss: Secondary | ICD-10-CM | POA: Diagnosis not present

## 2023-07-31 DIAGNOSIS — R32 Unspecified urinary incontinence: Secondary | ICD-10-CM | POA: Diagnosis not present

## 2023-07-31 DIAGNOSIS — E7800 Pure hypercholesterolemia, unspecified: Secondary | ICD-10-CM | POA: Diagnosis not present

## 2023-07-31 DIAGNOSIS — Z0000 Encounter for general adult medical examination without abnormal findings: Secondary | ICD-10-CM | POA: Diagnosis not present

## 2023-07-31 DIAGNOSIS — E1169 Type 2 diabetes mellitus with other specified complication: Secondary | ICD-10-CM | POA: Diagnosis not present

## 2023-07-31 DIAGNOSIS — Z23 Encounter for immunization: Secondary | ICD-10-CM | POA: Diagnosis not present

## 2023-07-31 DIAGNOSIS — K219 Gastro-esophageal reflux disease without esophagitis: Secondary | ICD-10-CM | POA: Diagnosis not present

## 2023-08-02 DIAGNOSIS — N3941 Urge incontinence: Secondary | ICD-10-CM | POA: Diagnosis not present

## 2023-08-30 DIAGNOSIS — N959 Unspecified menopausal and perimenopausal disorder: Secondary | ICD-10-CM | POA: Diagnosis not present

## 2023-08-30 DIAGNOSIS — Z01419 Encounter for gynecological examination (general) (routine) without abnormal findings: Secondary | ICD-10-CM | POA: Diagnosis not present

## 2023-08-30 DIAGNOSIS — Z6823 Body mass index (BMI) 23.0-23.9, adult: Secondary | ICD-10-CM | POA: Diagnosis not present

## 2023-11-25 ENCOUNTER — Other Ambulatory Visit: Payer: Self-pay

## 2023-11-25 ENCOUNTER — Encounter (HOSPITAL_BASED_OUTPATIENT_CLINIC_OR_DEPARTMENT_OTHER): Payer: Self-pay

## 2023-11-25 ENCOUNTER — Emergency Department (HOSPITAL_BASED_OUTPATIENT_CLINIC_OR_DEPARTMENT_OTHER): Admitting: Radiology

## 2023-11-25 ENCOUNTER — Emergency Department (HOSPITAL_BASED_OUTPATIENT_CLINIC_OR_DEPARTMENT_OTHER): Admission: EM | Admit: 2023-11-25 | Discharge: 2023-11-25 | Disposition: A | Discharge status: Home / Self Care

## 2023-11-25 DIAGNOSIS — E119 Type 2 diabetes mellitus without complications: Secondary | ICD-10-CM | POA: Insufficient documentation

## 2023-11-25 DIAGNOSIS — I10 Essential (primary) hypertension: Secondary | ICD-10-CM | POA: Diagnosis not present

## 2023-11-25 DIAGNOSIS — Z7984 Long term (current) use of oral hypoglycemic drugs: Secondary | ICD-10-CM | POA: Diagnosis not present

## 2023-11-25 DIAGNOSIS — R0781 Pleurodynia: Secondary | ICD-10-CM | POA: Insufficient documentation

## 2023-11-25 DIAGNOSIS — Z79899 Other long term (current) drug therapy: Secondary | ICD-10-CM | POA: Diagnosis not present

## 2023-11-25 DIAGNOSIS — R0789 Other chest pain: Secondary | ICD-10-CM | POA: Diagnosis not present

## 2023-11-25 DIAGNOSIS — R079 Chest pain, unspecified: Secondary | ICD-10-CM | POA: Insufficient documentation

## 2023-11-25 LAB — BASIC METABOLIC PANEL WITH GFR
Anion gap: 10 (ref 5–15)
BUN: 10 mg/dL (ref 8–23)
CO2: 28 mmol/L (ref 22–32)
Calcium: 9.4 mg/dL (ref 8.9–10.3)
Chloride: 104 mmol/L (ref 98–111)
Creatinine, Ser: 0.64 mg/dL (ref 0.44–1.00)
GFR, Estimated: >60 mL/min (ref >60)
Glucose, Bld: 103 mg/dL — ABNORMAL HIGH (ref 70–99)
Potassium: 4 mmol/L (ref 3.5–5.1)
Sodium: 142 mmol/L (ref 135–145)

## 2023-11-25 LAB — TROPONIN I (HIGH SENSITIVITY)
Troponin I (High Sensitivity): 2 ng/L (ref <18)
Troponin I (High Sensitivity): 3 ng/L (ref <18)

## 2023-11-25 LAB — CBC
HCT: 40.1 % (ref 36.0–46.0)
Hemoglobin: 13.3 g/dL (ref 12.0–15.0)
MCH: 29.2 pg (ref 26.0–34.0)
MCHC: 33.2 g/dL (ref 30.0–36.0)
MCV: 87.9 fL (ref 80.0–100.0)
Platelets: 257 10*3/uL (ref 150–400)
RBC: 4.56 MIL/uL (ref 3.87–5.11)
RDW: 13.4 % (ref 11.5–15.5)
WBC: 8.7 10*3/uL (ref 4.0–10.5)
nRBC: 0 % (ref 0.0–0.2)

## 2023-11-25 LAB — D-DIMER, QUANTITATIVE: D-Dimer, Quant: 0.3 ug{FEU}/mL (ref 0.00–0.50)

## 2023-11-25 MED ORDER — ALUM & MAG HYDROXIDE-SIMETH 200-200-20 MG/5ML PO SUSP
30.0000 mL | Freq: Once | ORAL | Status: AC
  Administered 2023-11-25: 30 mL via ORAL
  Filled 2023-11-25: qty 30

## 2023-11-25 MED ORDER — ACETAMINOPHEN 500 MG PO TABS
1000.0000 mg | ORAL_TABLET | Freq: Once | ORAL | Status: AC
  Administered 2023-11-25: 1000 mg via ORAL
  Filled 2023-11-25: qty 2

## 2023-11-25 MED ORDER — FAMOTIDINE 20 MG PO TABS
20.0000 mg | ORAL_TABLET | Freq: Once | ORAL | Status: AC
  Administered 2023-11-25: 20 mg via ORAL
  Filled 2023-11-25: qty 1

## 2023-11-25 NOTE — ED Triage Notes (Signed)
 Pt reports chest pressure and rib pain/shoulder pain, since yesterday (04 APR). Pt denies any SOB. Pt denies any N/V. Pt denies any changes in pain.

## 2023-11-25 NOTE — ED Notes (Signed)
 Blue top vial in lab also

## 2023-11-25 NOTE — ED Notes (Signed)
 Called lab to run d-dimer on hold blood, spoke with Shanda Bumps.

## 2023-11-25 NOTE — ED Provider Notes (Signed)
 [place] EMERGENCY DEPARTMENT AT [facility] Provider Note   CSN: [id number] Arrival date & time: 11/25/23  1228     History  Chief Complaint  Patient presents with   Chest Pain   HPI Meghan Padilla is a 73 y.o. female with history of hypertension, diabetes and hyperlipidemia presenting for chest pain.  Started yesterday.  Pain is in the center of the chest.  She also endorses right and left rib pain as well.  Unsure if the pain is radiating from her chest.  Denies shortness of breath.  Denies nausea and vomiting.  Pain is nonpleuritic and nonexertional.  At times it can hurt worse with movement.   Chest Pain      Home Medications Prior to Admission medications   Medication Sig Start Date End Date Taking? Authorizing Provider  atorvastatin (LIPITOR) 40 MG tablet Take 40 mg by mouth daily.    [provider]  metFORMIN (GLUCOPHAGE) 500 MG tablet Take 4 tablets (2,000 mg total) by mouth at bedtime. 06/05/18   Magrinat, Valentino Hue, MD  omeprazole (PRILOSEC) 20 MG capsule Take 20 mg by mouth daily.    [provider]  Probiotic Product (PROBIOTIC DAILY) CAPS Take by mouth. 06/05/18   Magrinat, Valentino Hue, MD  quinapril (ACCUPRIL) 20 MG tablet Take 20 mg by mouth at bedtime.    [provider]  tolterodine (DETROL LA) 4 MG 24 hr capsule TAKE 1 CAPSULE BY MOUTH EVERY DAY 06/05/20   McKenzie, Mardene Celeste, MD      Allergies    Sulfa antibiotics    Review of Systems   Review of Systems  Cardiovascular:  Positive for chest pain.    Physical Exam Updated Vital Signs BP 134/73   Pulse 73   Temp 97.7 F (36.5 C) (Oral)   Resp 10   Ht 5\' 1"  (1.549 m)   Wt 54.4 kg   SpO2 98%   BMI 22.67 kg/m  Physical Exam Vitals and nursing note reviewed.  HENT:     Head: Normocephalic and atraumatic.     Mouth/Throat:     Mouth: Mucous membranes are moist.  Eyes:     General:        Right eye: No discharge.        Left eye: No discharge.      Conjunctiva/sclera: Conjunctivae normal.  Cardiovascular:     Rate and Rhythm: Normal rate and regular rhythm.     Pulses: Normal pulses.     Heart sounds: Normal heart sounds.  Pulmonary:     Effort: Pulmonary effort is normal.     Breath sounds: Normal breath sounds.  Abdominal:     General: Abdomen is flat.     Palpations: Abdomen is soft.  Skin:    General: Skin is warm and dry.  Neurological:     General: No focal deficit present.  Psychiatric:        Mood and Affect: Mood normal.     ED Results / Procedures / Treatments   Labs (all labs ordered are listed, but only abnormal results are displayed) Labs Reviewed  BASIC METABOLIC PANEL WITH GFR - Abnormal; Notable for the following components:      Result Value   Glucose, Bld 103 (*)    All other components within normal limits  CBC  D-DIMER, QUANTITATIVE  TROPONIN I (HIGH SENSITIVITY)  TROPONIN I (HIGH SENSITIVITY)    EKG EKG Interpretation Date/Time:  Saturday November 25 2023 12:44:47 EDT Ventricular Rate:  98 PR Interval:  140 QRS Duration:  74 QT Interval:  340 QTC Calculation: 434 R Axis:   67  Text Interpretation: Normal sinus rhythm Nonspecific ST abnormality Abnormal ECG When compared with ECG of 15-Jan-2023 12:01, PREVIOUS ECG IS PRESENT Confirmed by Beckey Downing [phone]) on 11/25/2023 1:08:28 PM  Radiology DG Chest 2 View Result Date: 11/25/2023 CLINICAL DATA:  Chest pain. EXAM: CHEST - 2 VIEW COMPARISON:  Chest radiograph dated 01/15/2023. FINDINGS: No focal consolidation, pleural effusion or pneumothorax. The cardiac silhouette is within normal limits. No acute osseous pathology. Small radiopaque densities in the right anterior chest wall. IMPRESSION: No active cardiopulmonary disease. Electronically Signed   By: Elgie Collard M.D.   On: 11/25/2023 13:14    Procedures Procedures    Medications Ordered in ED Medications  acetaminophen (TYLENOL) tablet 1,000 mg (1,000 mg Oral Given 11/25/23 1501)   famotidine (PEPCID) tablet 20 mg (20 mg Oral Given 11/25/23 1501)  alum & mag hydroxide-simeth (MAALOX/MYLANTA) 200-200-20 MG/5ML suspension 30 mL (30 mLs Oral Given 11/25/23 1607)    ED Course/ Medical Decision Making/ A&P             HEART Score: 5                    Medical Decision Making Amount and/or Complexity of Data Reviewed Labs: ordered. Radiology: ordered.  Risk OTC drugs.   Initial Impression and Ddx 72 year old well-appearing female presenting for chest pain.  Exam was unremarkable.  DDx includes ACS, PE, pneumonia, pneumothorax, reflux, aortic dissection, other. Patient PMH that increases complexity of ED encounter:   hypertension, diabetes and hyperlipidemia   Interpretation of Diagnostics - I independent reviewed and interpreted the labs as followed: no acute derangements, negative dimer and troponins  - I independently visualized the following imaging with scope of interpretation limited to determining acute life threatening conditions related to emergency care: cxr, which revealed no acute process  - I personally reviewed and interpreted EKG which revealed normal sinus rhythm  Patient Reassessment and Ultimate Disposition/Management On reassessment, patient remained well, chest pain did improve. Workup overall reassuring.  However heart score is 5.  Do feel that she is appropriate to be discharged but does need close follow-up with cardiology.  Submitted ambulatory referral.  Discussed strict return precautions.  Discharged in good condition.  Patient management required discussion with the following services or consulting groups:  None  Complexity of Problems Addressed Acute complicated illness or Injury  Additional Data Reviewed and Analyzed Further history obtained from: Past medical history and medications listed in the EMR and Prior ED visit notes  Patient Encounter Risk Assessment None         Final Clinical Impression(s) / ED Diagnoses Final  diagnoses:  Chest pain, unspecified type    Rx / DC Orders ED Discharge Orders          Ordered    Ambulatory referral to Cardiology       Comments: If you have not heard from the Cardiology office within the next 72 hours please call [phone].   11/25/23 1829              Gareth Eagle, PA-C 11/25/23 2130    Vanetta Mulders, MD 11/26/23 1100

## 2023-11-25 NOTE — Discharge Instructions (Addendum)
 Evaluation today for your chest pain was overall reassuring.  However I do feel it is indicated that you follow-up with cardiology in the outpatient setting.  I submitted a ambulatory referral.  If your chest pain returns, you have shortness of breath, calf tenderness, or any other concerning symptom please return to the ED for further evaluation.

## 2023-11-28 ENCOUNTER — Encounter: Payer: Self-pay | Admitting: Cardiology

## 2023-11-28 ENCOUNTER — Ambulatory Visit: Attending: Cardiology | Admitting: Cardiology

## 2023-11-28 VITALS — BP 151/80 | HR 97 | Ht 61.0 in | Wt 125.0 lb

## 2023-11-28 DIAGNOSIS — E119 Type 2 diabetes mellitus without complications: Secondary | ICD-10-CM

## 2023-11-28 DIAGNOSIS — K219 Gastro-esophageal reflux disease without esophagitis: Secondary | ICD-10-CM

## 2023-11-28 DIAGNOSIS — I10 Essential (primary) hypertension: Secondary | ICD-10-CM | POA: Diagnosis not present

## 2023-11-28 DIAGNOSIS — E7800 Pure hypercholesterolemia, unspecified: Secondary | ICD-10-CM

## 2023-11-28 DIAGNOSIS — R079 Chest pain, unspecified: Secondary | ICD-10-CM | POA: Diagnosis not present

## 2023-11-28 NOTE — Progress Notes (Signed)
 Cardiology Office Note:  .   Date:  11/28/2023  ID:  Meghan Padilla, DOB 09-01-1950, MRN [id number] PCP: Ollen Bowl, MD  [facility] Providers Cardiologist:  Yates Decamp, MD   History of Present Illness: .   Meghan Padilla is a 73 y.o. Female patient referred to me for evaluation of chest pain when she presented to the emergency room on 11/25/2023, ruled out for ACS and discharged home.  Past medical history significant for hypertension, diabetes mellitus type 2, hypercholesterolemia history of breast cancer SP partial right mastectomy and seed implantation in 2017.  Discussed the use of AI scribe software for clinical note transcription with the patient, who gave verbal consent to proceed.  She describes the sensation as a pressing feeling in the chest, which began last Thursday and has been continuous since. The discomfort has gradually improved since its onset but remains present.   The patient denies any exacerbation of the discomfort with physical activity, including walking from the parking lot to the clinic and chasing after her nine-month-old granddaughter. She also denies any associated shortness of breath. The patient is currently on medication for her diabetes, hypertension, hyperlipidemia, and acid reflux. She has an upcoming appointment with her primary care provider.  Labs   Lab Results  Component Value Date   NA 142 11/25/2023   K 4.0 11/25/2023   CO2 28 11/25/2023   GLUCOSE 103 (H) 11/25/2023   BUN 10 11/25/2023   CREATININE 0.64 11/25/2023   CALCIUM 9.4 11/25/2023   EGFR 79 (L) 04/10/2017   GFRNONAA >60 11/25/2023      Latest Ref Rng & Units 11/25/2023    1:26 PM 01/15/2023   12:06 PM 06/06/2019   11:12 AM  BMP  Glucose 70 - 99 mg/dL 191  478  96   BUN 8 - 23 mg/dL 10  5  11    Creatinine 0.44 - 1.00 mg/dL 2.95  6.21  3.08   Sodium 135 - 145 mmol/L 142  143  142   Potassium 3.5 - 5.1 mmol/L 4.0  3.9  3.7   Chloride 98 - 111 mmol/L 104  107  106   CO2  22 - 32 mmol/L 28  26  24    Calcium 8.9 - 10.3 mg/dL 9.4  9.3  9.1       Latest Ref Rng & Units 11/25/2023    1:26 PM 01/15/2023   12:06 PM 04/08/2021   10:41 AM  CBC  WBC 4.0 - 10.5 K/uL 8.7  8.4  8.5   Hemoglobin 12.0 - 15.0 g/dL 65.7  84.6  96.2   Hematocrit 36.0 - 46.0 % 40.1  42.3  42.9   Platelets 150 - 400 K/uL 257  253  266    External Labs:  PCP labs on Care Everywhere 07/31/2023:  TSH normal at 1.71.  A1c 6.8%.  Total cholesterol 123, triglycerides 94, HDL 54, LDL 51.  Review of Systems  Cardiovascular:  Positive for chest pain. Negative for dyspnea on exertion and leg swelling.  Gastrointestinal:  Positive for heartburn.   Physical Exam:   VS:  BP (!) 151/80   Pulse 97   Ht 5\' 1"  (1.549 m)   Wt 125 lb (56.7 kg)   SpO2 95%   BMI 23.62 kg/m    Wt Readings from Last 3 Encounters:  11/28/23 125 lb (56.7 kg)  11/25/23 120 lb (54.4 kg)  01/15/23 125 lb (56.7 kg)    Physical Exam Neck:  Vascular: No carotid bruit or JVD.  Cardiovascular:     Rate and Rhythm: Normal rate and regular rhythm.     Pulses: Intact distal pulses.     Heart sounds: Normal heart sounds. No murmur heard.    No gallop.  Pulmonary:     Effort: Pulmonary effort is normal.     Breath sounds: Normal breath sounds.  Abdominal:     General: Bowel sounds are normal.     Palpations: Abdomen is soft.  Musculoskeletal:     Right lower leg: No edema.     Left lower leg: No edema.    Studies Reviewed: Marland Kitchen     EKG:    EKG Interpretation Date/Time:  Tuesday November 28 2023 14:35:15 EDT Ventricular Rate:  97 PR Interval:  134 QRS Duration:  84 QT Interval:  358 QTC Calculation: 454 R Axis:   50  Text Interpretation: EKG 11/28/2023: Normal sinus rhythm with rate of 97 bpm, normal axis.  Baseline wander but otherwise normal EKG.  No significant change from 11/25/2023. Confirmed by Delrae Rend [phone]) on 11/28/2023 2:58:28 PM    Medications and allergies    Allergies  Allergen Reactions    Empagliflozin Other (See Comments)   Semaglutide Nausea Only   Sitagliptin     Other Reaction(s): vaginitis   Sulfa Antibiotics Nausea Only   Sulfur     Other Reaction(s): nausea, headaches     Current Outpatient Medications:    atorvastatin (LIPITOR) 40 MG tablet, Take 40 mg by mouth daily., Disp: , Rfl:    benazepril (LOTENSIN) 20 MG tablet, Take 20 mg by mouth daily., Disp: , Rfl:    Cholecalciferol (VITAMIN D3) 25 MCG (1000 UT) CAPS, 1 capsule Orally Once a day, Disp: , Rfl:    Cranberry (CRANBERRY ULTRA STRENGTH) 500 MG TABS, as directed Orally daily, Disp: , Rfl:    metFORMIN (GLUCOPHAGE) 500 MG tablet, Take 4 tablets (2,000 mg total) by mouth at bedtime., Disp: , Rfl:    omeprazole (PRILOSEC) 20 MG capsule, Take 20 mg by mouth daily., Disp: , Rfl:    Probiotic Product (PROBIOTIC DAILY) CAPS, Take by mouth., Disp: , Rfl:    saxagliptin HCl (ONGLYZA) 2.5 MG TABS tablet, Take 2.5 mg by mouth daily., Disp: , Rfl:    tolterodine (DETROL LA) 4 MG 24 hr capsule, TAKE 1 CAPSULE BY MOUTH EVERY DAY, Disp: 90 capsule, Rfl: 3   No orders of the defined types were placed in this encounter.    Medications Discontinued During This Encounter  Medication Reason   quinapril (ACCUPRIL) 20 MG tablet Discontinued by provider     ASSESSMENT AND PLAN: .      ICD-10-CM   1. Chest pain due to gastrointestinal reflux disease, without esophagitis  K21.9 EKG 12-Lead   R07.9     2. Elevated blood pressure reading in office with diagnosis of hypertension  I10     3. Pure hypercholesterolemia  E78.00 EKG 12-Lead    4. Type 2 diabetes mellitus without complication, without long-term current use of insulin (HCC)  E11.9 EKG 12-Lead      1. Precordial pain She reports pressing chest pain that began last Thursday and persisted through Saturday, with slight improvement but ongoing presence. The pain does not worsen with physical activity, and there is no shortness of breath. An esophageal spasm  related to acid reflux is suspected rather than a cardiac cause, as the EKG is normal and the physical exam is benign.  Advised her  to try Maalox.  Also in view of underlying near sinus tachycardia, elevated blood pressure, could consider adding diltiazem CD which will help with both esophageal spasm and controlling her blood pressure and elevated heart rate.  2. Primary hypertension and elevated blood pressure in the office Her blood pressure was elevated during the visit, potentially due to stress. She is currently on benazepril for hypertension. If blood pressure remains elevated, diltiazem could be added to help control it and potentially relieve esophageal spasm. She is instructed to monitor her blood pressure at home and record readings until the next primary care appointment. Communication with her primary care physician is advised to consider adding diltiazem if blood pressure remains elevated.  3. Pure hypercholesterolemia I reviewed her lipids, she is presently on high intensity high-dose statin atorvastatin 40 mg daily with excellent control of LDL.  Continue the same.  4. Type 2 diabetes mellitus without complication, without long-term current use of insulin (HCC) Diabetes is also well-controlled.  Although she has major cardiovascular risk factors for coronary artery disease and atherosclerosis, her symptoms do not appear to be cardiac in etiology, she continues to remain physically active.  She will call us if GI workup is negative or she has persistent symptoms or notices exertional component to her chest pain or notices worsening dyspnea.  For now continue primary prevention.   Follow-up   A follow-up appointment with her primary care physician is scheduled for Thursday. She should ensure recorded blood pressure readings are taken to the appointment and discuss potential medication adjustments with her primary care physician.  Signed,  Yates Decamp, MD, [facility] 11/28/2023, 3:18 PM [facility] [address] [place], [place] [address] Phone: [phone]. Fax:  [phone]

## 2023-11-28 NOTE — Patient Instructions (Addendum)
 Esophageal Spasm  An esophageal spasm is a sudden tightening (contraction) of the esophagus, which is the part of the body that moves food from the mouth to the stomach. Normally, smooth, wave-like muscle contractions move food and liquids down the esophagus. Esophageal spasms are abnormal muscle contractions that can cause chest pain and trouble swallowing (dysphagia). Spasms may also cause swallowed foods or liquids to come back up into the throat (regurgitation). There are two types of esophageal spasms. You may have one or both types: Diffuse esophageal spasms. These are irregular, uncoordinated spasms. This type tends to cause more dysphagia. Nutcracker esophagus. This is a type of spasm in which the muscles move normally, but the contraction is very strong. This type tends to be more painful. Severe esophageal spasms can make it hard to eat and do everyday activities. They often occur with severe heartburn (reflux esophagitis). The symptoms can come and go and may be triggered or worsened depending on your diet or other medical issues. What are the causes? The cause of esophageal spasms is not known. What increases the risk? The following factors may make you more likely to develop esophageal spasms: Being female. Age. The risk may increase as you get older. Depression or anxiety. Having gastroesophageal reflux disease (GERD). What are the signs or symptoms? Symptoms may vary from day to day. They may be mild or severe. They may last for minutes or hours. Common symptoms include: Chest pain. This may feel like a heart attack. Back pain. Dysphagia. Heartburn. A feeling that something is stuck in the throat (globus). Regurgitation of foods or liquids. For some people, certain things may trigger symptoms, such as: Certain foods and drinks. These may include very hot or very cold foods or drinks. Eating very quickly. How is this diagnosed? This condition may be diagnosed based on your  symptoms and a physical exam. You may have tests, such as: Endoscopy. This involves using a flexible tube that has a camera on the end of it (endoscope) to look down your throat and examine your esophagus. Barium swallow. For this test, you drink a substance that will show up well on X-rays (barium) and then have X-rays to see how the substance moves through your esophagus. Esophageal manometry. This involves passing a small, thin tube through your nose and down into your throat. The tube contains pressure sensors that measure muscle contractions in the esophagus while you swallow. How is this treated? Mild esophageal spasms may not need treatment. You may be able to manage the spasms by avoiding triggers. For more frequent or severe spasms, treatment may include: Medicine to: Relax the esophageal muscles. Relieve muscle spasms (calcium channel blockers and nitrates). Relieve pain by blocking nerve endings in the esophagus. This is done with an injection of a toxin (botulinum). Relieve heartburn (proton pump inhibitors). Antidepressant medicines. These are sometimes used to ease symptoms. Surgery to reduce esophageal muscle contractions (myotomy). This may be needed for very severe cases. Follow these instructions at home: Eating and drinking Keep track of foods, drinks, and habits that trigger spasms or heartburn. Avoid these triggers as much as you can. Eat meals slowly. Chew food completely before swallowing. Avoid swallowing foods and drinks when they are very hot or very cold. General instructions Take over-the-counter and prescription medicines only as told by your health care provider. Find ways to manage stress, such as regular exercise or meditation. If you struggle with depression or anxiety, talk with your health care provider about treatment options. Keep all  follow-up visits. This is important. Contact a health care provider if: Your symptoms get worse or do not get better with  medicine. You are losing weight because of dysphagia. Your esophageal spasms affect your quality of life, such as your ability to eat. Get help right away if: You have severe chest pain. You have chest pain that is different from your usual chest pain. You have trouble breathing. You choke. These symptoms may represent a serious problem that is an emergency. Do not wait to see if the symptoms will go away. Get medical help right away. Call your local emergency services (911 in the [place] Do not drive yourself to the hospital. Summary An esophageal spasm is a sudden tightening (contraction) of the esophagus, which is the part of the body that moves food from the mouth to the stomach. These abnormal muscle contractions can cause chest pain and trouble swallowing (dysphagia). The cause of esophageal spasms is not known. Treatment may not be needed for mild spasms. For frequent or more severe spasms, treatment may include medicine, or, for very severe spasms, surgery. Keep track of foods, drinks, and habits that trigger spasms or heartburn. Avoid these triggers as much as you can. This information is not intended to replace advice given to you by your health care provider. Make sure you discuss any questions you have with your health care provider. Document Revised: 03/27/2020 Document Reviewed: 03/28/2020 Elsevier Patient Education  2024 [employer]    Medication Instructions:  Your physician recommends that you continue on your current medications as directed. Please refer to the Current Medication list given to you today.  *If you need a refill on your cardiac medications before your next appointment, please call your pharmacy*  Lab Work: none If you have labs (blood work) drawn today and your tests are completely normal, you will receive your results only by: MyChart Message (if you have MyChart) OR A paper copy in the mail If you have any lab test that is abnormal or we need to  change your treatment, we will call you to review the results.  Testing/Procedures: none  Follow-Up: At [facility], you and your health needs are our priority.  As part of our continuing mission to provide you with exceptional heart care, our providers are all part of one team.  This team includes your primary Cardiologist (physician) and Advanced Practice Providers or APPs (Physician Assistants and Nurse Practitioners) who all work together to provide you with the care you need, when you need it.  Your next appointment:   As needed  Provider:   Yates Decamp, MD     We recommend signing up for the patient portal called "MyChart".  Sign up information is provided on this After Visit Summary.  MyChart is used to connect with patients for Virtual Visits (Telemedicine).  Patients are able to view lab/test results, encounter notes, upcoming appointments, etc.  Non-urgent messages can be sent to your provider as well.   To learn more about what you can do with MyChart, go to [email or web address].   Other Instructions        1st Floor: - Lobby - Registration  - Pharmacy  - Lab - Cafe  2nd Floor: - PV Lab - Diagnostic Testing (echo, CT, nuclear med)  3rd Floor: - Vacant  4th Floor: - TCTS (cardiothoracic surgery) - AFib Clinic - Structural Heart Clinic - Vascular Surgery  - Vascular Ultrasound  5th Floor: - HeartCare Cardiology (general and EP) - Clinical Pharmacy  for coumadin, hypertension, lipid, weight-loss medications, and med management appointments    Valet parking services will be available as well.

## 2023-11-30 DIAGNOSIS — E1169 Type 2 diabetes mellitus with other specified complication: Secondary | ICD-10-CM | POA: Diagnosis not present

## 2023-11-30 DIAGNOSIS — I10 Essential (primary) hypertension: Secondary | ICD-10-CM | POA: Diagnosis not present

## 2023-11-30 DIAGNOSIS — K219 Gastro-esophageal reflux disease without esophagitis: Secondary | ICD-10-CM | POA: Diagnosis not present

## 2023-11-30 DIAGNOSIS — R0789 Other chest pain: Secondary | ICD-10-CM | POA: Diagnosis not present

## 2023-12-05 ENCOUNTER — Encounter: Payer: Self-pay | Admitting: Adult Health

## 2023-12-05 ENCOUNTER — Inpatient Hospital Stay: Payer: Medicare Other | Attending: Adult Health | Admitting: Adult Health

## 2023-12-05 VITALS — BP 148/71 | HR 90 | Temp 97.7°F | Resp 18 | Ht 61.0 in | Wt 126.1 lb

## 2023-12-05 DIAGNOSIS — Z853 Personal history of malignant neoplasm of breast: Secondary | ICD-10-CM | POA: Diagnosis not present

## 2023-12-05 DIAGNOSIS — M85851 Other specified disorders of bone density and structure, right thigh: Secondary | ICD-10-CM | POA: Diagnosis not present

## 2023-12-05 NOTE — Progress Notes (Signed)
 [facility] Cancer Follow up:    Pahwani, Kasandra Knudsen, MD [address] [place] [address]   DIAGNOSIS:  Cancer Staging  History of right breast cancer Staging form: Breast, AJCC 7th Edition - Clinical: Stage IA (T1a, N0, M0) - Signed by Lowella Dell, MD on 03/17/2016    SUMMARY OF ONCOLOGIC HISTORY: Oncology History  History of right breast cancer  03/17/2016 Initial Diagnosis   Breast cancer of upper-outer quadrant of right female breast (HCC)   03/24/2016 Surgery   Right lumpectomy ([facility]): ILC, grade 2, 1.3 cm, LCIS, margins negative, 3 SLN negative, ER+(95%), PR+(2%), Ki-67 10%, Her2 negative   03/24/2016 Oncotype testing   Oncotype score: 16, ROR 10%.    05/04/2016 - 06/01/2016 Radiation Therapy   Adjuvant Radiation ([facility]): 1) Right Breast: 42.72 Gy in 16 fractions.  2) Right Breast Boost: 10 Gy in 5 fractions   07/22/2016 -  Anti-estrogen oral therapy   Anastrozole daily, 5 years of therapy planned.       CURRENT THERAPY: observation  INTERVAL HISTORY:  Discussed the use of AI scribe software for clinical note transcription with the patient, who gave verbal consent to proceed.  Meghan Padilla 73 y.o. female with a history of invasive lobular carcinoma stage 1A, presents for a routine follow-up. She underwent a right lumpectomy in 2017, followed by adjuvant radiation and five years of anti-estrogen therapy, which was completed in 2023. Her most recent mammogram, conducted in August 2024, showed no mammographic evidence of malignancy. The patient also has osteopenia, with a T-score of -1.6 in the right femoral neck, as per a bone density test conducted in July 2019.  Recently, the patient experienced chest pain and was evaluated in the ER and by a cardiologist. The chest pain was determined to be due to esophageal spasms. She has been started on a new blood pressure medication, diltiazem 120mg  daily, which also helps with the esophageal  spasms.  The patient is active, caring for her nine-month-old granddaughter, cleaning her house, and her parents' house. She maintains a healthy diet, rich in fruits and vegetables, and limits her meat intake.   Patient Active Problem List   Diagnosis Date Noted   Overactive bladder 07/09/2021   Absolute anemia 06/14/2019   B12 deficiency 06/14/2019   Diabetes mellitus (HCC) 02/27/2019   Hyperlipidemia 11/20/2016   GERD (gastroesophageal reflux disease) 11/20/2016   Hypertensive disorder 11/20/2016   Rash 08/04/2016   History of right breast cancer 03/17/2016    is allergic to empagliflozin, semaglutide, sitagliptin, sulfa antibiotics, and sulfur.  MEDICAL HISTORY: Past Medical History:  Diagnosis Date   Breast cancer (HCC)    Cancer (HCC) 2017   right breast   Diabetes mellitus    GERD (gastroesophageal reflux disease)    History of radiation therapy 05/04/16-06/01/16   right breast 42.72 Gy boost to 10 Gy   Hyperlipidemia    Hypertension    Personal history of radiation therapy 05/05/2016   ended radiaiton 06-01-2016   UTI (urinary tract infection)     SURGICAL HISTORY: Past Surgical History:  Procedure Laterality Date   BREAST BIOPSY Right 02/29/2016   BREAST BIOPSY Right 11/12/2019   BREAST BIOPSY Right 07/14/2020   BREAST BIOPSY Right 05/2022   skin punch   BREAST LUMPECTOMY Right 03/24/2016   right 03/2016   DILATION AND CURETTAGE OF UTERUS     HAND SURGERY     HAND SURGERY     HYSTEROSCOPY WITH D &  C  11/28/2008   RADIOACTIVE SEED GUIDED PARTIAL MASTECTOMY WITH AXILLARY SENTINEL LYMPH NODE BIOPSY Right 03/24/2016   Procedure: RADIOACTIVE SEED GUIDED PARTIAL MASTECTOMY WITH AXILLARY SENTINEL LYMPH NODE BIOPSY;  Surgeon: Sim Dryer, MD;  Location: [facility];  Service: General;  Laterality: Right;   TONSILLECTOMY      SOCIAL HISTORY: Social History   Socioeconomic History   Marital status: Married    Spouse name: Not on file   Number  of children: 2   Years of education: Not on file   Highest education level: Not on file  Occupational History   Occupation: retired Manufacturing systems engineer  Tobacco Use   Smoking status: Never   Smokeless tobacco: Never  Substance and Sexual Activity   Alcohol use: No   Drug use: No   Sexual activity: Not on file  Other Topics Concern   Not on file  Social History Narrative   Not on file   Social Drivers of Health   Financial Resource Strain: Not on file  Food Insecurity: Not on file  Transportation Needs: Not on file  Physical Activity: Not on file  Stress: Not on file  Social Connections: Not on file  Intimate Partner Violence: Not on file    FAMILY HISTORY: Family History  Problem Relation Age of Onset   Cancer Neg Hx     Review of Systems  Constitutional:  Negative for appetite change, chills, fatigue, fever and unexpected weight change.  HENT:   Negative for hearing loss, lump/mass and trouble swallowing.   Eyes:  Negative for eye problems and icterus.  Respiratory:  Negative for chest tightness, cough and shortness of breath.   Cardiovascular:  Negative for chest pain, leg swelling and palpitations.  Gastrointestinal:  Negative for abdominal distention, abdominal pain, constipation, diarrhea, nausea and vomiting.  Endocrine: Negative for hot flashes.  Genitourinary:  Negative for difficulty urinating.   Musculoskeletal:  Negative for arthralgias.  Skin:  Negative for itching and rash.  Neurological:  Negative for dizziness, extremity weakness, headaches and numbness.  Hematological:  Negative for adenopathy. Does not bruise/bleed easily.  Psychiatric/Behavioral:  Negative for depression. The patient is not nervous/anxious.       PHYSICAL EXAMINATION    Vitals:   12/05/23 1120  BP: (!) 148/71  Pulse: 90  Resp: 18  Temp: 97.7 F (36.5 C)  SpO2: 100%    Physical Exam Constitutional:      General: She is not in acute distress.    Appearance: Normal  appearance. She is not toxic-appearing.  HENT:     Head: Normocephalic and atraumatic.     Mouth/Throat:     Mouth: Mucous membranes are moist.     Pharynx: Oropharynx is clear. No oropharyngeal exudate or posterior oropharyngeal erythema.  Eyes:     General: No scleral icterus. Cardiovascular:     Rate and Rhythm: Normal rate and regular rhythm.     Pulses: Normal pulses.     Heart sounds: Normal heart sounds.  Pulmonary:     Effort: Pulmonary effort is normal.     Breath sounds: Normal breath sounds.  Chest:     Comments: Right breast s/p lumpectomy and radiation, no sign of local recurrence, left breast benign Abdominal:     General: Abdomen is flat. Bowel sounds are normal. There is no distension.     Palpations: Abdomen is soft.     Tenderness: There is no abdominal tenderness.  Musculoskeletal:        General:  No swelling.     Cervical back: Neck supple.  Lymphadenopathy:     Cervical: No cervical adenopathy.     Upper Body:     Right upper body: No supraclavicular or axillary adenopathy.     Left upper body: No supraclavicular or axillary adenopathy.  Skin:    General: Skin is warm and dry.     Findings: No rash.  Neurological:     General: No focal deficit present.     Mental Status: She is alert.  Psychiatric:        Mood and Affect: Mood normal.        Behavior: Behavior normal.     LABORATORY DATA:  CBC    Component Value Date/Time   WBC 8.7 11/25/2023 1326   RBC 4.56 11/25/2023 1326   HGB 13.3 11/25/2023 1326   HGB 12.0 04/10/2017 1329   HCT 40.1 11/25/2023 1326   HCT 37.9 04/10/2017 1329   PLT 257 11/25/2023 1326   PLT 226 04/10/2017 1329   MCV 87.9 11/25/2023 1326   MCV 85.7 04/10/2017 1329   MCH 29.2 11/25/2023 1326   MCHC 33.2 11/25/2023 1326   RDW 13.4 11/25/2023 1326   RDW 15.0 (H) 04/10/2017 1329   LYMPHSABS 2.3 04/08/2021 1041   LYMPHSABS 1.9 04/10/2017 1329   MONOABS 0.5 04/08/2021 1041   MONOABS 0.6 04/10/2017 1329   EOSABS 0.3  04/08/2021 1041   EOSABS 1.1 (H) 04/10/2017 1329   BASOSABS 0.1 04/08/2021 1041   BASOSABS 0.1 04/10/2017 1329    CMP     Component Value Date/Time   NA 142 11/25/2023 1326   NA 142 04/10/2017 1329   K 4.0 11/25/2023 1326   K 3.5 04/10/2017 1329   CL 104 11/25/2023 1326   CO2 28 11/25/2023 1326   CO2 31 (H) 04/10/2017 1329   GLUCOSE 103 (H) 11/25/2023 1326   GLUCOSE 174 (H) 04/10/2017 1329   BUN 10 11/25/2023 1326   BUN 7.3 04/10/2017 1329   CREATININE 0.64 11/25/2023 1326   CREATININE 0.8 04/10/2017 1329   CALCIUM 9.4 11/25/2023 1326   CALCIUM 9.4 04/10/2017 1329   PROT 6.8 06/06/2019 1112   PROT 6.4 04/10/2017 1329   ALBUMIN 4.0 06/06/2019 1112   ALBUMIN 3.6 04/10/2017 1329   AST 19 06/06/2019 1112   AST 22 04/10/2017 1329   ALT 17 06/06/2019 1112   ALT 20 04/10/2017 1329   ALKPHOS 83 06/06/2019 1112   ALKPHOS 78 04/10/2017 1329   BILITOT 0.3 06/06/2019 1112   BILITOT 0.25 04/10/2017 1329   GFRNONAA >60 11/25/2023 1326   GFRAA >60 06/06/2019 1112     ASSESSMENT and THERAPY PLAN:   History of right breast cancer Meghan Padilla is a 73 year old woman with h/o stage IA ER positiv/e right sided breast cancer diagnosed in 02/2016 s/p lumpectomy, adjuvant radiation therapy, and Anastrozole x 5 years completed in 07/2021.  Breast cancer, status post-treatment Status post right lumpectomy for invasive lobular carcinoma stage 1A in 2017, followed by adjuvant radiation and five years of anti-estrogen therapy, completed in 2023. No current evidence of breast cancer recurrence. Continued surveillance with mammograms is crucial. - Continue annual mammograms, next due in August 2025. - Encourage a healthy diet and active lifestyle.  Esophageal spasms Diagnosed with esophageal spasms, started on diltiazem 120 mg daily with improved symptoms. The medication also addresses hypertension. - Continue diltiazem 120 mg daily. - Follow up with primary care provider for blood pressure and  esophageal spasm management.  Hypertension On diltiazem, which also addresses esophageal spasms. Regular monitoring is essential for effective management. - Monitor blood pressure with primary care provider. - Continue diltiazem 120 mg daily.  Osteopenia Osteopenia with a T score of -1.6 in the right femoral neck from a bone density test in July 2019. Monitoring bone density is important to assess progression and guide treatment decisions. - Obtain bone density test in August 2025. - Request a copy of the bone density test results for records.  Follow-up Advised to maintain regular follow-up appointments and to contact the provider if any new issues arise before the next scheduled visit. - Schedule follow-up appointment in one year. - Advise to call and schedule an earlier appointment if new symptoms or concerns arise.   All questions were answered. The patient knows to call the clinic with any problems, questions or concerns. We can certainly see the patient much sooner if necessary.  Total encounter time:20 minutes*in face-to-face visit time, chart review, lab review, care coordination, order entry, and documentation of the encounter time.    Alwin Baars, NP 12/05/23 1:25 PM Medical Oncology and Hematology [facility] [address] [place], [place] [address] Tel. [phone]    Fax. [phone]  *Total Encounter Time as defined by the Centers for Medicare and Medicaid Services includes, in addition to the face-to-face time of a patient visit (documented in the note above) non-face-to-face time: obtaining and reviewing outside history, ordering and reviewing medications, tests or procedures, care coordination (communications with other health care professionals or caregivers) and documentation in the medical record.

## 2023-12-05 NOTE — Assessment & Plan Note (Signed)
 Meghan Padilla is a 73 year old woman with h/o stage IA ER positiv/e right sided breast cancer diagnosed in 02/2016 s/p lumpectomy, adjuvant radiation therapy, and Anastrozole x 5 years completed in 07/2021.  Breast cancer, status post-treatment Status post right lumpectomy for invasive lobular carcinoma stage 1A in 2017, followed by adjuvant radiation and five years of anti-estrogen therapy, completed in 2023. No current evidence of breast cancer recurrence. Continued surveillance with mammograms is crucial. - Continue annual mammograms, next due in August 2025. - Encourage a healthy diet and active lifestyle.  Esophageal spasms Diagnosed with esophageal spasms, started on diltiazem 120 mg daily with improved symptoms. The medication also addresses hypertension. - Continue diltiazem 120 mg daily. - Follow up with primary care provider for blood pressure and esophageal spasm management.  Hypertension On diltiazem, which also addresses esophageal spasms. Regular monitoring is essential for effective management. - Monitor blood pressure with primary care provider. - Continue diltiazem 120 mg daily.  Osteopenia Osteopenia with a T score of -1.6 in the right femoral neck from a bone density test in July 2019. Monitoring bone density is important to assess progression and guide treatment decisions. - Obtain bone density test in August 2025. - Request a copy of the bone density test results for records.  Follow-up Advised to maintain regular follow-up appointments and to contact the provider if any new issues arise before the next scheduled visit. - Schedule follow-up appointment in one year. - Advise to call and schedule an earlier appointment if new symptoms or concerns arise.

## 2024-01-29 DIAGNOSIS — B070 Plantar wart: Secondary | ICD-10-CM | POA: Diagnosis not present

## 2024-01-29 DIAGNOSIS — E1169 Type 2 diabetes mellitus with other specified complication: Secondary | ICD-10-CM | POA: Diagnosis not present

## 2024-01-29 DIAGNOSIS — B351 Tinea unguium: Secondary | ICD-10-CM | POA: Diagnosis not present

## 2024-01-29 DIAGNOSIS — E7800 Pure hypercholesterolemia, unspecified: Secondary | ICD-10-CM | POA: Diagnosis not present

## 2024-01-29 DIAGNOSIS — M8588 Other specified disorders of bone density and structure, other site: Secondary | ICD-10-CM | POA: Diagnosis not present

## 2024-01-29 DIAGNOSIS — I10 Essential (primary) hypertension: Secondary | ICD-10-CM | POA: Diagnosis not present

## 2024-01-29 DIAGNOSIS — R0789 Other chest pain: Secondary | ICD-10-CM | POA: Diagnosis not present

## 2024-01-29 DIAGNOSIS — B009 Herpesviral infection, unspecified: Secondary | ICD-10-CM | POA: Diagnosis not present

## 2024-01-29 DIAGNOSIS — K219 Gastro-esophageal reflux disease without esophagitis: Secondary | ICD-10-CM | POA: Diagnosis not present

## 2024-01-29 DIAGNOSIS — R32 Unspecified urinary incontinence: Secondary | ICD-10-CM | POA: Diagnosis not present

## 2024-01-29 DIAGNOSIS — N319 Neuromuscular dysfunction of bladder, unspecified: Secondary | ICD-10-CM | POA: Diagnosis not present

## 2024-02-12 DIAGNOSIS — E7800 Pure hypercholesterolemia, unspecified: Secondary | ICD-10-CM | POA: Diagnosis not present

## 2024-02-12 DIAGNOSIS — K219 Gastro-esophageal reflux disease without esophagitis: Secondary | ICD-10-CM | POA: Diagnosis not present

## 2024-02-12 DIAGNOSIS — E1169 Type 2 diabetes mellitus with other specified complication: Secondary | ICD-10-CM | POA: Diagnosis not present

## 2024-02-12 DIAGNOSIS — J988 Other specified respiratory disorders: Secondary | ICD-10-CM | POA: Diagnosis not present

## 2024-02-12 DIAGNOSIS — I10 Essential (primary) hypertension: Secondary | ICD-10-CM | POA: Diagnosis not present

## 2024-02-28 DIAGNOSIS — E1169 Type 2 diabetes mellitus with other specified complication: Secondary | ICD-10-CM | POA: Diagnosis not present

## 2024-02-28 DIAGNOSIS — B070 Plantar wart: Secondary | ICD-10-CM | POA: Diagnosis not present

## 2024-03-07 ENCOUNTER — Encounter: Payer: Self-pay | Admitting: Internal Medicine

## 2024-03-07 DIAGNOSIS — Z1231 Encounter for screening mammogram for malignant neoplasm of breast: Secondary | ICD-10-CM

## 2024-03-12 DIAGNOSIS — B070 Plantar wart: Secondary | ICD-10-CM | POA: Diagnosis not present

## 2024-03-12 DIAGNOSIS — N6312 Unspecified lump in the right breast, upper inner quadrant: Secondary | ICD-10-CM | POA: Diagnosis not present

## 2024-03-12 DIAGNOSIS — R32 Unspecified urinary incontinence: Secondary | ICD-10-CM | POA: Diagnosis not present

## 2024-03-13 ENCOUNTER — Other Ambulatory Visit: Payer: Self-pay | Admitting: Internal Medicine

## 2024-03-13 DIAGNOSIS — N6312 Unspecified lump in the right breast, upper inner quadrant: Secondary | ICD-10-CM

## 2024-03-14 ENCOUNTER — Other Ambulatory Visit: Payer: Self-pay | Admitting: Internal Medicine

## 2024-03-14 ENCOUNTER — Ambulatory Visit
Admission: RE | Admit: 2024-03-14 | Discharge: 2024-03-14 | Disposition: A | Discharge status: Home / Self Care | Source: Ambulatory Visit | Attending: Internal Medicine | Admitting: Internal Medicine

## 2024-03-14 DIAGNOSIS — N6312 Unspecified lump in the right breast, upper inner quadrant: Secondary | ICD-10-CM

## 2024-03-14 DIAGNOSIS — Z853 Personal history of malignant neoplasm of breast: Secondary | ICD-10-CM | POA: Diagnosis not present

## 2024-03-14 DIAGNOSIS — Z08 Encounter for follow-up examination after completed treatment for malignant neoplasm: Secondary | ICD-10-CM | POA: Diagnosis not present

## 2024-03-27 DIAGNOSIS — M8588 Other specified disorders of bone density and structure, other site: Secondary | ICD-10-CM | POA: Diagnosis not present

## 2024-03-27 DIAGNOSIS — N958 Other specified menopausal and perimenopausal disorders: Secondary | ICD-10-CM | POA: Diagnosis not present

## 2024-07-31 DIAGNOSIS — M8588 Other specified disorders of bone density and structure, other site: Secondary | ICD-10-CM | POA: Diagnosis not present

## 2024-07-31 DIAGNOSIS — B351 Tinea unguium: Secondary | ICD-10-CM | POA: Diagnosis not present

## 2024-07-31 DIAGNOSIS — Z1331 Encounter for screening for depression: Secondary | ICD-10-CM | POA: Diagnosis not present

## 2024-07-31 DIAGNOSIS — C50911 Malignant neoplasm of unspecified site of right female breast: Secondary | ICD-10-CM | POA: Diagnosis not present

## 2024-07-31 DIAGNOSIS — Z23 Encounter for immunization: Secondary | ICD-10-CM | POA: Diagnosis not present

## 2024-07-31 DIAGNOSIS — Z0000 Encounter for general adult medical examination without abnormal findings: Secondary | ICD-10-CM | POA: Diagnosis not present

## 2024-07-31 DIAGNOSIS — E7800 Pure hypercholesterolemia, unspecified: Secondary | ICD-10-CM | POA: Diagnosis not present

## 2024-07-31 DIAGNOSIS — E1169 Type 2 diabetes mellitus with other specified complication: Secondary | ICD-10-CM | POA: Diagnosis not present

## 2024-07-31 DIAGNOSIS — B009 Herpesviral infection, unspecified: Secondary | ICD-10-CM | POA: Diagnosis not present

## 2024-07-31 DIAGNOSIS — I10 Essential (primary) hypertension: Secondary | ICD-10-CM | POA: Diagnosis not present

## 2024-07-31 DIAGNOSIS — E538 Deficiency of other specified B group vitamins: Secondary | ICD-10-CM | POA: Diagnosis not present

## 2024-07-31 DIAGNOSIS — D509 Iron deficiency anemia, unspecified: Secondary | ICD-10-CM | POA: Diagnosis not present

## 2024-07-31 DIAGNOSIS — R32 Unspecified urinary incontinence: Secondary | ICD-10-CM | POA: Diagnosis not present

## 2024-07-31 DIAGNOSIS — J309 Allergic rhinitis, unspecified: Secondary | ICD-10-CM | POA: Diagnosis not present

## 2024-12-04 ENCOUNTER — Inpatient Hospital Stay

## 2024-12-04 ENCOUNTER — Encounter: Admitting: Adult Health
